# Patient Record
Sex: Female | Born: 1965 | Race: Black or African American | Hispanic: No | Marital: Single | Smoking: Current every day smoker
Health system: Southern US, Community
[De-identification: ages and names within clinical notes are randomized; demographics above are authoritative.]

## PROBLEM LIST (undated history)

## (undated) DIAGNOSIS — K439 Ventral hernia without obstruction or gangrene: Secondary | ICD-10-CM

## (undated) DIAGNOSIS — D649 Anemia, unspecified: Secondary | ICD-10-CM

## (undated) DIAGNOSIS — F32A Depression, unspecified: Secondary | ICD-10-CM

## (undated) DIAGNOSIS — Z9289 Personal history of other medical treatment: Secondary | ICD-10-CM

## (undated) DIAGNOSIS — F1911 Other psychoactive substance abuse, in remission: Secondary | ICD-10-CM

## (undated) DIAGNOSIS — F419 Anxiety disorder, unspecified: Secondary | ICD-10-CM

## (undated) DIAGNOSIS — F1910 Other psychoactive substance abuse, uncomplicated: Secondary | ICD-10-CM

## (undated) DIAGNOSIS — F329 Major depressive disorder, single episode, unspecified: Secondary | ICD-10-CM

## (undated) HISTORY — PX: TUBAL LIGATION: SHX77

## (undated) HISTORY — DX: Personal history of other medical treatment: Z92.89

## (undated) HISTORY — DX: Other psychoactive substance abuse, uncomplicated: F19.10

## (undated) HISTORY — PX: SALPINGECTOMY: SHX328

---
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
## 2011-03-17 DIAGNOSIS — F410 Panic disorder [episodic paroxysmal anxiety] without agoraphobia: Secondary | ICD-10-CM | POA: Insufficient documentation

## 2014-03-27 DIAGNOSIS — F1120 Opioid dependence, uncomplicated: Secondary | ICD-10-CM | POA: Insufficient documentation

## 2014-05-05 DIAGNOSIS — R7309 Other abnormal glucose: Secondary | ICD-10-CM | POA: Insufficient documentation

## 2018-01-25 DIAGNOSIS — N939 Abnormal uterine and vaginal bleeding, unspecified: Secondary | ICD-10-CM | POA: Insufficient documentation

## 2019-02-23 ENCOUNTER — Emergency Department (HOSPITAL_COMMUNITY): Payer: Medicare Other

## 2019-02-23 ENCOUNTER — Emergency Department (HOSPITAL_COMMUNITY)
Admission: EM | Admit: 2019-02-23 | Discharge: 2019-02-23 | Disposition: A | Discharge status: Home / Self Care | Payer: Medicare Other | Attending: Emergency Medicine | Admitting: Emergency Medicine

## 2019-02-23 ENCOUNTER — Other Ambulatory Visit: Payer: Self-pay

## 2019-02-23 DIAGNOSIS — K439 Ventral hernia without obstruction or gangrene: Secondary | ICD-10-CM | POA: Insufficient documentation

## 2019-02-23 DIAGNOSIS — Z79899 Other long term (current) drug therapy: Secondary | ICD-10-CM | POA: Diagnosis not present

## 2019-02-23 DIAGNOSIS — R109 Unspecified abdominal pain: Secondary | ICD-10-CM | POA: Diagnosis present

## 2019-02-23 DIAGNOSIS — K420 Umbilical hernia with obstruction, without gangrene: Secondary | ICD-10-CM | POA: Diagnosis not present

## 2019-02-23 LAB — CBC WITH DIFFERENTIAL/PLATELET
Abs Immature Granulocytes: 0.01 10*3/uL (ref 0.00–0.07)
Basophils Absolute: 0 10*3/uL (ref 0.0–0.1)
Basophils Relative: 1 %
Eosinophils Absolute: 0.1 10*3/uL (ref 0.0–0.5)
Eosinophils Relative: 2 %
HCT: 33 % — ABNORMAL LOW (ref 36.0–46.0)
Hemoglobin: 10 g/dL — ABNORMAL LOW (ref 12.0–15.0)
Immature Granulocytes: 0 %
Lymphocytes Relative: 35 %
Lymphs Abs: 1.7 10*3/uL (ref 0.7–4.0)
MCH: 24.4 pg — ABNORMAL LOW (ref 26.0–34.0)
MCHC: 30.3 g/dL (ref 30.0–36.0)
MCV: 80.7 fL (ref 80.0–100.0)
Monocytes Absolute: 0.4 10*3/uL (ref 0.1–1.0)
Monocytes Relative: 8 %
Neutro Abs: 2.7 10*3/uL (ref 1.7–7.7)
Neutrophils Relative %: 54 %
Platelets: 212 10*3/uL (ref 150–400)
RBC: 4.09 MIL/uL (ref 3.87–5.11)
RDW: 23.9 % — ABNORMAL HIGH (ref 11.5–15.5)
WBC: 4.9 10*3/uL (ref 4.0–10.5)
nRBC: 0 % (ref 0.0–0.2)

## 2019-02-23 LAB — BASIC METABOLIC PANEL
Anion gap: 9 (ref 5–15)
BUN: 12 mg/dL (ref 6–20)
CO2: 22 mmol/L (ref 22–32)
Calcium: 8.6 mg/dL — ABNORMAL LOW (ref 8.9–10.3)
Chloride: 104 mmol/L (ref 98–111)
Creatinine, Ser: 0.75 mg/dL (ref 0.44–1.00)
GFR calc Af Amer: >60 mL/min (ref >60)
GFR calc non Af Amer: >60 mL/min (ref >60)
Glucose, Bld: 122 mg/dL — ABNORMAL HIGH (ref 70–99)
Potassium: 3.2 mmol/L — ABNORMAL LOW (ref 3.5–5.1)
Sodium: 135 mmol/L (ref 135–145)

## 2019-02-23 LAB — I-STAT BETA HCG BLOOD, ED (MC, WL, AP ONLY): I-stat hCG, quantitative: <5 m[IU]/mL (ref <5)

## 2019-02-23 MED ORDER — ONDANSETRON 4 MG PO TBDP: 4.0000 mg | ORAL_TABLET | Freq: Three times a day (TID) | ORAL | 0 refills | Status: DC | PRN

## 2019-02-23 MED ORDER — ONDANSETRON HCL 4 MG/2ML IJ SOLN
4.0000 mg | Freq: Once | INTRAMUSCULAR | Status: AC
  Administered 2019-02-23: 4 mg via INTRAVENOUS
  Filled 2019-02-23: qty 2

## 2019-02-23 MED ORDER — HYDROMORPHONE HCL 1 MG/ML IJ SOLN
1.0000 mg | Freq: Once | INTRAMUSCULAR | Status: AC
  Administered 2019-02-23: 1 mg via INTRAVENOUS
  Filled 2019-02-23: qty 1

## 2019-02-23 NOTE — ED Notes (Signed)
Pt transported to Xray. 

## 2019-02-23 NOTE — ED Notes (Signed)
Pt noted to be sticking her finger in the back of her throat trying to gag herself.

## 2019-02-23 NOTE — ED Provider Notes (Signed)
[facility] EMERGENCY DEPARTMENT Provider Note   CSN: [id number] Arrival date & time: 02/23/19  1206    History   Chief Complaint Chief Complaint  Patient presents with  . Abdominal Pain    HPI Paula Leon is a 53 y.o. female.     HPI Patient presents with abdominal pain and vomiting.  Began around 40 minutes prior to arrival.  States this feels like the hernia she has had previously.  States it previously gone and on its own.  Pain is in her periumbilical area as is the mass.  No fevers.  Not on blood thinners.  She is on methadone.  States she has moved from [place] and does not have a doctor up here yet but does get her methadone up here. No past medical history on file. Does have history of anemia.  Also some depression. There are no active problems to display for this patient.     OB History   No obstetric history on file.      Home Medications    Prior to Admission medications   Medication Sig Start Date End Date Taking? Authorizing Provider  ferrous sulfate 325 (65 FE) MG tablet Take 325 mg by mouth 2 (two) times daily with a meal.   Yes [provider]  methadone (DOLOPHINE) 10 MG/5ML solution Take 40 mg by mouth daily.   Yes [provider]  PARoxetine (PAXIL) 20 MG tablet Take 20 mg by mouth daily.   Yes [provider]  ondansetron (ZOFRAN-ODT) 4 MG disintegrating tablet Take 1 tablet (4 mg total) by mouth every 8 (eight) hours as needed for nausea or vomiting. 02/23/19   Benjiman Core, MD    Family History No family history on file.  Social History Social History   Tobacco Use  . Smoking status: Not on file  Substance Use Topics  . Alcohol use: Not on file  . Drug use: Not on file     Allergies   Patient has no known allergies.   Review of Systems Review of Systems  Constitutional: Negative for appetite change.  HENT: Negative for congestion.   Respiratory: Negative for shortness of breath.    Cardiovascular: Negative for chest pain.  Gastrointestinal: Positive for abdominal pain, nausea and vomiting.  Genitourinary: Negative for flank pain.  Musculoskeletal: Negative for back pain.  Neurological: Negative for weakness.  Psychiatric/Behavioral: Negative for confusion.     Physical Exam Updated Vital Signs BP 107/64   Pulse 94   Temp 97.9 F (36.6 C) (Oral)   Resp 13   Ht 5\' 4"  (1.626 m)   Wt 99.8 kg   LMP 02/21/2019 (Exact Date)   SpO2 96%   BMI 37.76 kg/m   Physical Exam Vitals signs and nursing note reviewed.  Constitutional:      Comments: Patient is moaning in bed.  HENT:     Head: Atraumatic.  Eyes:     Pupils: Pupils are equal, round, and reactive to light.  Abdominal:     Hernia: A hernia is present. Hernia is present in the umbilical area.     Comments: Supraumbilical mass.  Approximately 5 to 7 cm across.  Tender.  Some mild diffuse tenderness without much distention otherwise.  Skin:    General: Skin is warm.  Neurological:     General: No focal deficit present.      ED Treatments / Results  Labs (all labs ordered are listed, but only abnormal results are displayed) Labs Reviewed  BASIC METABOLIC PANEL - Abnormal; Notable for the following components:      Result Value   Potassium 3.2 (*)    Glucose, Bld 122 (*)    Calcium 8.6 (*)    All other components within normal limits  CBC WITH DIFFERENTIAL/PLATELET - Abnormal; Notable for the following components:   Hemoglobin 10.0 (*)    HCT 33.0 (*)    MCH 24.4 (*)    RDW 23.9 (*)    All other components within normal limits  I-STAT BETA HCG BLOOD, ED ([facility], [facility], [facility] ONLY)    EKG EKG Interpretation  Date/Time:  Saturday Feb 23 2019 12:20:13 EDT Ventricular Rate:  81 PR Interval:    QRS Duration: 89 QT Interval:  358 QTC Calculation: 416 R Axis:   80 Text Interpretation:  Sinus rhythm Confirmed by Benjiman CorePickering, Kari Kerth [phone]([id number]) on 02/23/2019 12:32:26 PM   Radiology Dg Abd 2 Views   Result Date: 02/23/2019 CLINICAL DATA:  Central and upper abdominal pain beginning this afternoon. EXAM: ABDOMEN - 2 VIEW COMPARISON:  None. FINDINGS: The bowel gas pattern is normal. There is no evidence of free air. No radio-opaque calculi or other significant radiographic abnormality is seen. IMPRESSION: Negative exam. Electronically Signed   By: Drusilla Kannerhomas  Dalessio M.D.   On: 02/23/2019 16:03    Procedures Procedures (including critical care time)  Medications Ordered in ED Medications  HYDROmorphone (DILAUDID) injection 1 mg (1 mg Intravenous Given 02/23/19 1232)  ondansetron (ZOFRAN) injection 4 mg (4 mg Intravenous Given 02/23/19 1232)     Initial Impression / Assessment and Plan / ED Course  I have reviewed the triage vital signs and the nursing notes.  Pertinent labs & imaging results that were available during my care of the patient were reviewed by me and considered in my medical decision making (see chart for details).        Patient with umbilical hernia.  Initially very tender with mass but reduced under direct pressure.  Feels much better and pain resolved.  Eventually tolerated orals.  X-ray reassuring.  Will discharge home with general surgery follow-up.  Doubt ischemic bowel.  Final Clinical Impressions(s) / ED Diagnoses   Final diagnoses:  Hernia of abdominal wall  Umbilical hernia with obstruction, without gangrene    ED Discharge Orders         Ordered    ondansetron (ZOFRAN-ODT) 4 MG disintegrating tablet  Every 8 hours PRN     02/23/19 1711           Benjiman CorePickering, Murrell Dome, MD 02/23/19 1921

## 2019-02-23 NOTE — ED Triage Notes (Signed)
Pt here for central upper abdominal pain r/t hernia that began 30 mins ago. Pt writhing around in bed and crying. Pt endorses vomiting x 3.

## 2019-02-23 NOTE — ED Notes (Signed)
Pt gagging/stating she needs to vomit after PO challenge.

## 2019-02-23 NOTE — ED Notes (Signed)
Pt given a gingerale

## 2019-03-01 ENCOUNTER — Ambulatory Visit: Payer: Self-pay | Admitting: Surgery

## 2019-03-12 ENCOUNTER — Other Ambulatory Visit: Payer: Self-pay

## 2019-03-12 ENCOUNTER — Ambulatory Visit (HOSPITAL_COMMUNITY)
Admission: EM | Admit: 2019-03-12 | Discharge: 2019-03-12 | Disposition: A | Discharge status: Home / Self Care | Payer: Medicare Other

## 2019-03-12 ENCOUNTER — Encounter (HOSPITAL_COMMUNITY): Payer: Self-pay

## 2019-03-12 DIAGNOSIS — N644 Mastodynia: Secondary | ICD-10-CM | POA: Diagnosis not present

## 2019-03-12 HISTORY — DX: Ventral hernia without obstruction or gangrene: K43.9

## 2019-03-12 HISTORY — DX: Anemia, unspecified: D64.9

## 2019-03-12 NOTE — ED Provider Notes (Signed)
[facility]    CSN: [id number] Arrival date & time: 03/12/19  0801     History   Chief Complaint Chief Complaint  Patient presents with  . Breast Pain    HPI Paula Leon is a 53 y.o. female presenting with bilateral breast pain.  Patient states that this started Saturday with press.  States pain is dull, achy, "like it is full like it had a baby ", and constant.  Patient is not tried anything for this.  Patient states leaning forward causes more breast tenderness.  Patient also endorsing nipple tenderness: Denies retraction, discharge, change in appearance of skin - now redness or warmth to touch.  LMP 5/28: Patient not currently sexually active.  Patient denies lower abdominal, pelvic, vaginal pain or discharge.  She denies personal or family history of breast malignancy.   Past Medical History:  Diagnosis Date  . Anemia   . Hernia of abdominal wall     There are no active problems to display for this patient.   Past Surgical History:  Procedure Laterality Date  . TUBAL LIGATION      OB History   No obstetric history on file.      Home Medications    Prior to Admission medications   Medication Sig Start Date End Date Taking? Authorizing Provider  ferrous sulfate 325 (65 FE) MG tablet Take 325 mg by mouth 2 (two) times daily with a meal.    [provider]  methadone (DOLOPHINE) 10 MG/5ML solution Take 40 mg by mouth daily.    [provider]  ondansetron (ZOFRAN-ODT) 4 MG disintegrating tablet Take 1 tablet (4 mg total) by mouth every 8 (eight) hours as needed for nausea or vomiting. 02/23/19   Davonna Belling, MD  PARoxetine (PAXIL) 20 MG tablet Take 20 mg by mouth daily.    [provider]    Family History Family History  Problem Relation Age of Onset  . Cancer Mother   . Cancer Father     Social History Social History   Tobacco Use  . Smoking status: Current Every Day Smoker    Packs/day: 0.50    Types:  Cigarettes  . Smokeless tobacco: Never Used  Substance Use Topics  . Alcohol use: Never    Frequency: Never  . Drug use: Never     Allergies   Patient has no known allergies.   Review of Systems As per HPI   Physical Exam Triage Vital Signs ED Triage Vitals  Enc Vitals Group     BP      Pulse      Resp      Temp      Temp src      SpO2      Weight      Height      Head Circumference      Peak Flow      Pain Score      Pain Loc      Pain Edu?      Excl. in [place]?    No data found.  Updated Vital Signs BP (!) 146/82   Pulse 82   Temp 98.3 F (36.8 C)   Resp 18   LMP 02/21/2019 (Exact Date)   SpO2 99%   Visual Acuity Right Eye Distance:   Left Eye Distance:   Bilateral Distance:    Right Eye Near:   Left Eye Near:    Bilateral Near:     Physical Exam Constitutional:  General: She is not in acute distress. HENT:     Head: Normocephalic and atraumatic.  Eyes:     General: No scleral icterus.    Pupils: Pupils are equal, round, and reactive to light.  Cardiovascular:     Rate and Rhythm: Normal rate.  Pulmonary:     Effort: Pulmonary effort is normal.  Chest:     Comments: Large breasts that are symmetric and without obvious deformity.  No concerning masses, lesions, peau d'orange noted.  Patient is diffusely tender.  Nipples erect with stimulation, no discharge when palpated.  No axillary or supraclavicular lymphadenopathy appreciated. Skin:    Coloration: Skin is not jaundiced or pale.  Neurological:     Mental Status: She is alert and oriented to person, place, and time.      UC Treatments / Results  Labs (all labs ordered are listed, but only abnormal results are displayed) Labs Reviewed - No data to display  EKG None  Radiology No results found.  Procedures Procedures (including critical care time)  Medications Ordered in UC Medications - No data to display  Initial Impression / Assessment and Plan / UC Course  I have  reviewed the triage vital signs and the nursing notes.  Pertinent labs & imaging results that were available during my care of the patient were reviewed by me and considered in my medical decision making (see chart for details).     53 year old female presenting for bilateral breast tenderness.  Physical exam reassuring, could be related to perimenopausal hormone fluctuation.  Patient looking to establish care, due for routine mammography.  Information given in office, patient going to schedule appointment.  Patient to treat with NSAIDs, warm compresses, and monitor for worsening symptoms.  Precautions were discussed, patient verbalized understanding. Final Clinical Impressions(s) / UC Diagnoses   Final diagnoses:  Breast tenderness in female     Discharge Instructions     Use OTC ibuprofen and/or tylenol. Use hot compresses 3-4 times daily for additional relief.    ED Prescriptions    None     Controlled Substance Prescriptions [place] Controlled Substance Registry consulted? Not Applicable   Shea EvansHall-Potvin, , [place]PA-C 03/12/19 1633

## 2019-03-12 NOTE — ED Triage Notes (Signed)
Pt presents with complaints of right breast pain that started on Saturday. Denies any other symptoms.

## 2019-03-12 NOTE — Discharge Instructions (Addendum)
Use OTC ibuprofen and/or tylenol. Use hot compresses 3-4 times daily for additional relief.

## 2019-04-11 ENCOUNTER — Other Ambulatory Visit: Payer: Self-pay

## 2019-04-11 ENCOUNTER — Encounter (HOSPITAL_BASED_OUTPATIENT_CLINIC_OR_DEPARTMENT_OTHER): Payer: Self-pay

## 2019-04-14 ENCOUNTER — Encounter (HOSPITAL_BASED_OUTPATIENT_CLINIC_OR_DEPARTMENT_OTHER): Payer: Self-pay | Admitting: Surgery

## 2019-04-14 DIAGNOSIS — K439 Ventral hernia without obstruction or gangrene: Secondary | ICD-10-CM | POA: Diagnosis present

## 2019-04-14 NOTE — H&P (Signed)
General Surgery [facility]  Paula Leon DOB: 1966/07/28 Single / Language: [place] / Race: Black or African American Female   History of Present Illness   The patient is a 53 year old female who presents with an abdominal wall hernia.  CHIEF COMPLAINT: ventral hernia  Patient is referred by Dr. Benjiman CoreNathan Pickering from the emergency department for surgical evaluation and management of potential hernia. Patient has had a ventral hernia present for approximately 9 years. This has gradually increased in size. She has had a couple of episodes where it caused her considerable pain. She denies any change in bowel habits. She has had no prior abdominal surgery other than a tubal ligation. She has had no prior hernia repairs. She is referred from the emergency department for surgical evaluation for repair of ventral hernia.   Past Surgical History  No pertinent past surgical history   Diagnostic Studies History  Mammogram  never Pap Smear  1-5 years ago  Allergies  No Known Drug Allergies  Allergies Reconciled   Medication History FeroSul (325 (65 Fe)MG Tablet, Oral) Active. PARoxetine HCl (20MG  Tablet, Oral) Active. Methadone HCl (40MG  Tablet Soluble, Oral) Active. Medications Reconciled  Social History Alcohol use  Remotely quit alcohol use. Caffeine use  Coffee. Illicit drug use  Remotely quit drug use. Tobacco use  Current every day smoker.  Family History  Alcohol Abuse  Family Members In General. Hypertension  Family Members In General. Kidney Disease  Family Members In General.  Pregnancy / Birth History  Age at menarche  16 years. Age of menopause  [id number] Contraceptive History  Oral contraceptives. Maternal age  51-20 Para  7 Regular periods   Other Problems  Anxiety Disorder   Review of Systems General Not Present- Appetite Loss, Chills, Fatigue, Fever, Night Sweats, Weight Gain and Weight Loss. Skin Not  Present- Change in Wart/Mole, Dryness, Hives, Jaundice, New Lesions, Non-Healing Wounds, Rash and Ulcer. HEENT Not Present- Earache, Hearing Loss, Hoarseness, Nose Bleed, Oral Ulcers, Ringing in the Ears, Seasonal Allergies, Sinus Pain, Sore Throat, Visual Disturbances, Wears glasses/contact lenses and Yellow Eyes. Respiratory Not Present- Bloody sputum, Chronic Cough, Difficulty Breathing, Snoring and Wheezing. Breast Not Present- Breast Mass, Breast Pain, Nipple Discharge and Skin Changes. Cardiovascular Not Present- Chest Pain, Difficulty Breathing Lying Down, Leg Cramps, Palpitations, Rapid Heart Rate, Shortness of Breath and Swelling of Extremities. Gastrointestinal Present- Abdominal Pain and Nausea. Not Present- Bloating, Bloody Stool, Change in Bowel Habits, Chronic diarrhea, Constipation, Difficulty Swallowing, Excessive gas, Gets full quickly at meals, Hemorrhoids, Indigestion, Rectal Pain and Vomiting. Female Genitourinary Not Present- Frequency, Nocturia, Painful Urination, Pelvic Pain and Urgency. Musculoskeletal Not Present- Back Pain, Joint Pain, Joint Stiffness, Muscle Pain, Muscle Weakness and Swelling of Extremities. Neurological Not Present- Decreased Memory, Fainting, Headaches, Numbness, Seizures, Tingling, Tremor, Trouble walking and Weakness. Psychiatric Not Present- Anxiety, Bipolar, Change in Sleep Pattern, Depression, Fearful and Frequent crying. Endocrine Not Present- Cold Intolerance, Excessive Hunger, Hair Changes, Heat Intolerance, Hot flashes and New Diabetes. Hematology Not Present- Blood Thinners, Easy Bruising, Excessive bleeding, Gland problems, HIV and Persistent Infections.  Vitals  Weight: 231.6 lb Height: 64in Body Surface Area: 2.08 m Body Mass Index: 39.75 kg/m  Temp.: 98.30F  Pulse: 119 (Regular)  BP: 112/72(Sitting, Left Arm, Standard)   Physical Exam   See vital signs recorded above  GENERAL APPEARANCE Development: normal Nutritional  status: normal Gross deformities: none  SKIN Rash, lesions, ulcers: none Induration, erythema: none Nodules: none palpable  EYES Conjunctiva and  lids: normal Pupils: equal and reactive Iris: normal bilaterally  EARS, NOSE, MOUTH, THROAT External ears: no lesion or deformity External nose: no lesion or deformity Hearing: grossly normal Lips: no lesion or deformity Dentition: normal for age Oral mucosa: moist  NECK Symmetric: yes Trachea: midline Thyroid: no palpable nodules in the thyroid bed  CHEST Respiratory effort: normal Retraction or accessory muscle use: no Breath sounds: normal bilaterally Rales, rhonchi, wheeze: none  CARDIOVASCULAR Auscultation: regular rhythm, normal rate Murmurs: none Pulses: carotid and radial pulse 2+ palpable Lower extremity edema: none Lower extremity varicosities: none  ABDOMEN Distension: none Masses: none palpable Tenderness: none Hepatosplenomegaly: not present Hernia: Moderate sized hernia sac in the midline above the level of the umbilicus consistent with a midline ventral hernia. With manipulation it is partially reducible but this causes significant discomfort and hernia is not fully reducible. It is not tender.  MUSCULOSKELETAL Station and gait: normal Digits and nails: no clubbing or cyanosis Muscle strength: grossly normal all extremities Range of motion: grossly normal all extremities Deformity: none  LYMPHATIC Cervical: none palpable Supraclavicular: none palpable  PSYCHIATRIC Oriented to person, place, and time: yes Mood and affect: normal for situation Judgment and insight: appropriate for situation    Assessment & Plan  VENTRAL HERNIA WITHOUT OBSTRUCTION OR GANGRENE (K43.9)   Pt Education - Pamphlet Given - Hernia Surgery: discussed with patient and provided information.  Patient is referred by Dr. Davonna Belling from the emergency department for surgical evaluation and management of ventral  hernia. This is a long-standing ventral hernia which is gradually increased in size and is becoming more symptomatic. Patient is provided with written literature on hernia repair to review at home.  Patient has a ventral hernia which is not fully reducible. She has no signs or symptoms of obstruction. Patient does require operative repair. We discussed hernia repair using a mesh patch. We discussed restrictions on her activities after the procedure. We discussed wearing an abdominal binder. Patient understands and wishes to proceed with surgery in the near future.  Patient does need to establish a relationship with a primary care provider. She also states that she is entering menopause and may need gynecologic evaluation. We will refer her to the [facility] across the street. They should be able to provide primary care and if necessary make referral to gynecology.  The risks and benefits of the procedure have been discussed at length with the patient. The patient understands the proposed procedure, potential alternative treatments, and the course of recovery to be expected. All of the patient's questions have been answered at this time. The patient wishes to proceed with surgery.  Armandina Gemma, [facility] Office: [phone]

## 2019-04-16 ENCOUNTER — Other Ambulatory Visit (HOSPITAL_COMMUNITY)
Admission: RE | Admit: 2019-04-16 | Discharge: 2019-04-16 | Disposition: A | Discharge status: Home / Self Care | Payer: Medicare Other | Source: Ambulatory Visit | Attending: Surgery | Admitting: Surgery

## 2019-04-16 DIAGNOSIS — Z1159 Encounter for screening for other viral diseases: Secondary | ICD-10-CM | POA: Insufficient documentation

## 2019-04-16 LAB — SARS CORONAVIRUS 2 (TAT 6-24 HRS): SARS Coronavirus 2: NEGATIVE

## 2019-04-19 ENCOUNTER — Ambulatory Visit (HOSPITAL_BASED_OUTPATIENT_CLINIC_OR_DEPARTMENT_OTHER): Payer: Medicare Other | Admitting: Anesthesiology

## 2019-04-19 ENCOUNTER — Encounter (HOSPITAL_BASED_OUTPATIENT_CLINIC_OR_DEPARTMENT_OTHER)
Admission: RE | Disposition: A | Discharge status: Home / Self Care | Payer: Self-pay | Source: Home / Self Care | Attending: Surgery

## 2019-04-19 ENCOUNTER — Ambulatory Visit (HOSPITAL_BASED_OUTPATIENT_CLINIC_OR_DEPARTMENT_OTHER)
Admission: RE | Admit: 2019-04-19 | Discharge: 2019-04-19 | Disposition: A | Discharge status: Home / Self Care | Payer: Medicare Other | Attending: Surgery | Admitting: Surgery

## 2019-04-19 ENCOUNTER — Other Ambulatory Visit: Payer: Self-pay

## 2019-04-19 ENCOUNTER — Encounter (HOSPITAL_BASED_OUTPATIENT_CLINIC_OR_DEPARTMENT_OTHER): Payer: Self-pay

## 2019-04-19 DIAGNOSIS — K436 Other and unspecified ventral hernia with obstruction, without gangrene: Secondary | ICD-10-CM | POA: Insufficient documentation

## 2019-04-19 DIAGNOSIS — Z6839 Body mass index (BMI) 39.0-39.9, adult: Secondary | ICD-10-CM | POA: Diagnosis not present

## 2019-04-19 DIAGNOSIS — Z79899 Other long term (current) drug therapy: Secondary | ICD-10-CM | POA: Insufficient documentation

## 2019-04-19 DIAGNOSIS — K439 Ventral hernia without obstruction or gangrene: Secondary | ICD-10-CM | POA: Diagnosis present

## 2019-04-19 DIAGNOSIS — Z79891 Long term (current) use of opiate analgesic: Secondary | ICD-10-CM | POA: Diagnosis not present

## 2019-04-19 DIAGNOSIS — G8929 Other chronic pain: Secondary | ICD-10-CM | POA: Insufficient documentation

## 2019-04-19 DIAGNOSIS — F17200 Nicotine dependence, unspecified, uncomplicated: Secondary | ICD-10-CM | POA: Diagnosis not present

## 2019-04-19 DIAGNOSIS — F419 Anxiety disorder, unspecified: Secondary | ICD-10-CM | POA: Insufficient documentation

## 2019-04-19 HISTORY — DX: Other psychoactive substance abuse, in remission: F19.11

## 2019-04-19 HISTORY — PX: VENTRAL HERNIA REPAIR: SHX424

## 2019-04-19 HISTORY — DX: Anxiety disorder, unspecified: F41.9

## 2019-04-19 HISTORY — PX: INSERTION OF MESH: SHX5868

## 2019-04-19 HISTORY — DX: Depression, unspecified: F32.A

## 2019-04-19 SURGERY — REPAIR, HERNIA, VENTRAL
Anesthesia: General | Site: Abdomen

## 2019-04-19 MED ORDER — ROCURONIUM BROMIDE 100 MG/10ML IV SOLN
INTRAVENOUS | Status: DC | PRN
  Administered 2019-04-19: 30 mg via INTRAVENOUS

## 2019-04-19 MED ORDER — MIDAZOLAM HCL 2 MG/2ML IJ SOLN
1.0000 mg | INTRAMUSCULAR | Status: DC | PRN
  Administered 2019-04-19: 16:00:00 2 mg via INTRAVENOUS

## 2019-04-19 MED ORDER — KETOROLAC TROMETHAMINE 30 MG/ML IJ SOLN
INTRAMUSCULAR | Status: AC
  Filled 2019-04-19: qty 1

## 2019-04-19 MED ORDER — KETAMINE HCL 100 MG/ML IJ SOLN
INTRAMUSCULAR | Status: AC
  Filled 2019-04-19: qty 1

## 2019-04-19 MED ORDER — HYDROMORPHONE HCL 1 MG/ML IJ SOLN
INTRAMUSCULAR | Status: AC
  Filled 2019-04-19: qty 0.5

## 2019-04-19 MED ORDER — MIDAZOLAM HCL 2 MG/2ML IJ SOLN
INTRAMUSCULAR | Status: AC
  Filled 2019-04-19: qty 2

## 2019-04-19 MED ORDER — FENTANYL CITRATE (PF) 100 MCG/2ML IJ SOLN
INTRAMUSCULAR | Status: AC
  Filled 2019-04-19: qty 2

## 2019-04-19 MED ORDER — HYDROCODONE-ACETAMINOPHEN 5-325 MG PO TABS: 1.0000 | ORAL_TABLET | ORAL | 0 refills | Status: DC | PRN

## 2019-04-19 MED ORDER — PROPOFOL 500 MG/50ML IV EMUL
INTRAVENOUS | Status: DC | PRN
  Administered 2019-04-19: 25 ug/kg/min via INTRAVENOUS

## 2019-04-19 MED ORDER — KETOROLAC TROMETHAMINE 30 MG/ML IJ SOLN
30.0000 mg | Freq: Once | INTRAMUSCULAR | Status: AC | PRN
  Administered 2019-04-19: 30 mg via INTRAVENOUS

## 2019-04-19 MED ORDER — SUGAMMADEX SODIUM 200 MG/2ML IV SOLN
INTRAVENOUS | Status: DC | PRN
  Administered 2019-04-19: 200 mg via INTRAVENOUS

## 2019-04-19 MED ORDER — PROMETHAZINE HCL 25 MG/ML IJ SOLN: 6.2500 mg | INTRAMUSCULAR | Status: DC | PRN

## 2019-04-19 MED ORDER — OXYCODONE HCL 5 MG PO TABS
5.0000 mg | ORAL_TABLET | Freq: Once | ORAL | Status: AC | PRN
  Administered 2019-04-19: 5 mg via ORAL

## 2019-04-19 MED ORDER — PROPOFOL 10 MG/ML IV BOLUS
INTRAVENOUS | Status: DC | PRN
  Administered 2019-04-19: 150 mg via INTRAVENOUS

## 2019-04-19 MED ORDER — FENTANYL CITRATE (PF) 100 MCG/2ML IJ SOLN
50.0000 ug | INTRAMUSCULAR | Status: DC | PRN
  Administered 2019-04-19: 150 ug via INTRAVENOUS
  Administered 2019-04-19: 16:00:00 50 ug via INTRAVENOUS

## 2019-04-19 MED ORDER — CEFAZOLIN SODIUM-DEXTROSE 2-4 GM/100ML-% IV SOLN
INTRAVENOUS | Status: AC
  Filled 2019-04-19: qty 100

## 2019-04-19 MED ORDER — CHLORHEXIDINE GLUCONATE CLOTH 2 % EX PADS: 6.0000 | MEDICATED_PAD | Freq: Once | CUTANEOUS | Status: DC

## 2019-04-19 MED ORDER — SCOPOLAMINE 1 MG/3DAYS TD PT72: 1.0000 | MEDICATED_PATCH | Freq: Once | TRANSDERMAL | Status: DC

## 2019-04-19 MED ORDER — LIDOCAINE HCL (CARDIAC) PF 100 MG/5ML IV SOSY
PREFILLED_SYRINGE | INTRAVENOUS | Status: DC | PRN
  Administered 2019-04-19: 100 mg via INTRAVENOUS

## 2019-04-19 MED ORDER — HYDROMORPHONE HCL 1 MG/ML IJ SOLN
0.5000 mg | INTRAMUSCULAR | Status: DC | PRN
  Administered 2019-04-19: 0.5 mg via INTRAVENOUS

## 2019-04-19 MED ORDER — HYDROMORPHONE HCL 1 MG/ML IJ SOLN
0.2500 mg | INTRAMUSCULAR | Status: DC | PRN
  Administered 2019-04-19 (×5): 0.5 mg via INTRAVENOUS

## 2019-04-19 MED ORDER — 0.9 % SODIUM CHLORIDE (POUR BTL) OPTIME
TOPICAL | Status: DC | PRN
  Administered 2019-04-19: 1000 mL

## 2019-04-19 MED ORDER — ONDANSETRON HCL 4 MG/2ML IJ SOLN
INTRAMUSCULAR | Status: DC | PRN
  Administered 2019-04-19 (×2): 4 mg via INTRAVENOUS

## 2019-04-19 MED ORDER — BUPIVACAINE HCL (PF) 0.5 % IJ SOLN
INTRAMUSCULAR | Status: DC | PRN
  Administered 2019-04-19: 30 mL

## 2019-04-19 MED ORDER — ACETAMINOPHEN 500 MG PO TABS: 1000.0000 mg | ORAL_TABLET | Freq: Once | ORAL | Status: DC

## 2019-04-19 MED ORDER — LACTATED RINGERS IV SOLN
INTRAVENOUS | Status: DC
  Administered 2019-04-19 (×3): via INTRAVENOUS

## 2019-04-19 MED ORDER — DEXTROSE 5 % IV SOLN
3.0000 g | INTRAVENOUS | Status: AC
  Administered 2019-04-19: 16:00:00 2 g via INTRAVENOUS

## 2019-04-19 MED ORDER — OXYCODONE HCL 5 MG PO TABS
ORAL_TABLET | ORAL | Status: AC
  Filled 2019-04-19: qty 1

## 2019-04-19 MED ORDER — SUCCINYLCHOLINE CHLORIDE 20 MG/ML IJ SOLN
INTRAMUSCULAR | Status: DC | PRN
  Administered 2019-04-19: 120 mg via INTRAVENOUS

## 2019-04-19 MED ORDER — OXYCODONE HCL 5 MG/5ML PO SOLN: 5.0000 mg | Freq: Once | ORAL | Status: AC | PRN

## 2019-04-19 MED ORDER — ACETAMINOPHEN 500 MG PO TABS
ORAL_TABLET | ORAL | Status: AC
  Filled 2019-04-19: qty 2

## 2019-04-19 MED ORDER — KETAMINE HCL 100 MG/ML IJ SOLN
INTRAMUSCULAR | Status: DC | PRN
  Administered 2019-04-19: 50 mg via INTRAVENOUS

## 2019-04-19 MED ORDER — DEXAMETHASONE SODIUM PHOSPHATE 4 MG/ML IJ SOLN
INTRAMUSCULAR | Status: DC | PRN
  Administered 2019-04-19: 10 mg via INTRAVENOUS

## 2019-04-19 SURGICAL SUPPLY — 43 items
BLADE CLIPPER SURG (BLADE) ×3 IMPLANT
BLADE HEX COATED 2.75 (ELECTRODE) ×3 IMPLANT
BLADE SURG 15 STRL LF DISP TIS (BLADE) ×1 IMPLANT
BLADE SURG 15 STRL SS (BLADE) ×2
CANISTER SUCT 1200ML W/VALVE (MISCELLANEOUS) ×3 IMPLANT
CHLORAPREP W/TINT 26 (MISCELLANEOUS) ×3 IMPLANT
CLOSURE WOUND 1/2 X4 (GAUZE/BANDAGES/DRESSINGS) ×1
COVER BACK TABLE REUSABLE LG (DRAPES) ×3 IMPLANT
COVER MAYO STAND REUSABLE (DRAPES) ×3 IMPLANT
COVER WAND RF STERILE (DRAPES) IMPLANT
DECANTER SPIKE VIAL GLASS SM (MISCELLANEOUS) ×3 IMPLANT
DRAIN CHANNEL 19F RND (DRAIN) IMPLANT
DRAPE LAPAROTOMY 100X72 PEDS (DRAPES) ×3 IMPLANT
DRAPE UTILITY XL STRL (DRAPES) ×3 IMPLANT
ELECT REM PT RETURN 9FT ADLT (ELECTROSURGICAL) ×3
ELECTRODE REM PT RTRN 9FT ADLT (ELECTROSURGICAL) ×1 IMPLANT
EVACUATOR SILICONE 100CC (DRAIN) IMPLANT
GAUZE SPONGE 4X4 12PLY STRL LF (GAUZE/BANDAGES/DRESSINGS) ×3 IMPLANT
GLOVE SURG ORTHO 8.0 STRL STRW (GLOVE) ×3 IMPLANT
GOWN STRL REUS W/ TWL LRG LVL3 (GOWN DISPOSABLE) ×1 IMPLANT
GOWN STRL REUS W/ TWL XL LVL3 (GOWN DISPOSABLE) ×1 IMPLANT
GOWN STRL REUS W/TWL LRG LVL3 (GOWN DISPOSABLE) ×2
GOWN STRL REUS W/TWL XL LVL3 (GOWN DISPOSABLE) ×2
MESH VENTRALEX ST 2.5 CRC MED (Mesh General) ×2 IMPLANT
NDL HYPO 25X1 1.5 SAFETY (NEEDLE) IMPLANT
NEEDLE HYPO 25X1 1.5 SAFETY (NEEDLE) IMPLANT
NS IRRIG 1000ML POUR BTL (IV SOLUTION) ×3 IMPLANT
PACK BASIN DAY SURGERY FS (CUSTOM PROCEDURE TRAY) ×3 IMPLANT
PENCIL BUTTON HOLSTER BLD 10FT (ELECTRODE) ×3 IMPLANT
SLEEVE SCD COMPRESS KNEE MED (MISCELLANEOUS) IMPLANT
SPONGE LAP 4X18 RFD (DISPOSABLE) ×3 IMPLANT
STRIP CLOSURE SKIN 1/2X4 (GAUZE/BANDAGES/DRESSINGS) ×2 IMPLANT
SUT ETHIBOND 0 MO6 C/R (SUTURE) IMPLANT
SUT ETHILON 3 0 PS 1 (SUTURE) IMPLANT
SUT MNCRL AB 4-0 PS2 18 (SUTURE) ×3 IMPLANT
SUT NOVA NAB DX-16 0-1 5-0 T12 (SUTURE) ×4 IMPLANT
SUT VICRYL 3-0 CR8 SH (SUTURE) ×3 IMPLANT
SYR CONTROL 10ML LL (SYRINGE) IMPLANT
TAPE HYPAFIX 4 X10 (GAUZE/BANDAGES/DRESSINGS) ×3 IMPLANT
TOWEL GREEN STERILE FF (TOWEL DISPOSABLE) ×6 IMPLANT
TUBE CONNECTING 20'X1/4 (TUBING) ×1
TUBE CONNECTING 20X1/4 (TUBING) ×2 IMPLANT
YANKAUER SUCT BULB TIP NO VENT (SUCTIONS) ×3 IMPLANT

## 2019-04-19 NOTE — Anesthesia Procedure Notes (Signed)
Procedure Name: Intubation Date/Time: 04/19/2019 3:54 PM Performed by: Lyndee Leo, CRNA Pre-anesthesia Checklist: Patient identified, Emergency Drugs available, Suction available and Patient being monitored Patient Re-evaluated:Patient Re-evaluated prior to induction Oxygen Delivery Method: Circle system utilized Preoxygenation: Pre-oxygenation with 100% oxygen Induction Type: IV induction and Rapid sequence Ventilation: Mask ventilation without difficulty Laryngoscope Size: Mac and 3 Grade View: Grade I Tube type: Oral Tube size: 7.0 mm Number of attempts: 1 Airway Equipment and Method: Stylet and Oral airway Placement Confirmation: ETT inserted through vocal cords under direct vision,  positive ETCO2 and breath sounds checked- equal and bilateral Secured at: 21 cm Tube secured with: Tape Dental Injury: Teeth and Oropharynx as per pre-operative assessment

## 2019-04-19 NOTE — Op Note (Signed)
Operative Note  Pre-operative Diagnosis:  Ventral hernia  Post-operative Diagnosis:  same  Surgeon:  Armandina Gemma, MD  Assistant:  none   Procedure:  Open repair ventral hernia with mesh patch  Anesthesia:  general  Estimated Blood Loss:  25 cc  Drains: none         Specimen: none  Indications:  Patient is referred by Dr. Davonna Belling from the emergency department for surgical evaluation and management of potential hernia. Patient has had a ventral hernia present for approximately 9 years. This has gradually increased in size. She has had a couple of episodes where it caused her considerable pain. She denies any change in bowel habits. She has had no prior abdominal surgery other than a tubal ligation. She has had no prior hernia repairs. She is referred from the emergency department for surgical evaluation for repair of ventral hernia.  Procedure Details:  The patient was seen in the pre-op holding area. The risks, benefits, complications, treatment options, and expected outcomes were previously discussed with the patient. The patient agreed with the proposed plan and has signed the informed consent form.  The patient was brought to the operating room by the surgical team, identified as Leandra Kern and the procedure verified. A "time out" was completed and the above information confirmed.  Following administration of general anesthesia, the patient is prepped and draped in the usual aseptic fashion.  After ascertaining that an adequate level of anesthesia been achieved, a 5 cm incision is made in the midline just above the umbilicus.  Dissection was carried in the subcutaneous tissues.  There is a large hernia sac measuring approximately 8 cm in diameter.  This is dissected out circumferentially down to the fascia.  Fascial defect is approximately 2 cm in diameter.  Hernia sac is opened.  The hernia contains incarcerated omentum.  Hernia sac is excised and discarded.  Fascia is  incised in the midline cephalad allowing for reduction of the omentum back into the peritoneal cavity.  Preperitoneal space is developed to accommodate the mesh.  A medium ventralex ST mesh patch measuring 6.4 cm in diameter is selected and prepared.  It is inserted into the preperitoneal space and deployed circumferentially.  The fascial defect is then closed with interrupted #1 Novafil simple sutures incorporating the mesh into the closure.  Fascial defect is completely closed without significant tension.  Local anesthetic is infiltrated circumferentially in the fascia.  Local anesthetic is also infiltrated circumferentially into the skin edges.  Subcutaneous tissues are closed in layers with interrupted 3-0 Vicryl sutures.  Skin is closed with a running 4-0 Monocryl subcuticular suture.  Wound is washed and dried and Dermabond is placed as dressing.  Patient is awakened from anesthesia and brought to the recovery room in stable condition.  The patient tolerated the procedure well.   Armandina Gemma, MD [facility] Office: [phone]

## 2019-04-19 NOTE — Transfer of Care (Signed)
Immediate Anesthesia Transfer of Care Note  Patient: Paula Leon  Procedure(s) Performed: VENTRAL HERNIA REPAIR (N/A Abdomen) INSERTION OF MESH (N/A Abdomen)  Patient Location: PACU  Anesthesia Type:General  Level of Consciousness: awake and confused  Airway & Oxygen Therapy: Patient Spontanous Breathing and Patient connected to face mask oxygen  Post-op Assessment: Report given to RN and Post -op Vital signs reviewed and stable  Post vital signs: Reviewed and stable  Last Vitals:  Vitals Value Taken Time  BP 152/116 04/19/19 1704  Temp    Pulse 106 04/19/19 1710  Resp 20 04/19/19 1710  SpO2 99 % 04/19/19 1710  Vitals shown include unvalidated device data.  Last Pain:  Vitals:   04/19/19 1334  TempSrc: Oral  PainSc: 0-No pain         Complications: No apparent anesthesia complications

## 2019-04-19 NOTE — Anesthesia Postprocedure Evaluation (Signed)
Anesthesia Post Note  Patient: Paula Leon  Procedure(s) Performed: VENTRAL HERNIA REPAIR (N/A Abdomen) INSERTION OF MESH (N/A Abdomen)     Patient location during evaluation: PACU Anesthesia Type: General Level of consciousness: awake and alert Pain management: pain level controlled Vital Signs Assessment: post-procedure vital signs reviewed and stable Respiratory status: spontaneous breathing, nonlabored ventilation, respiratory function stable and patient connected to nasal cannula oxygen Cardiovascular status: blood pressure returned to baseline and stable Postop Assessment: no apparent nausea or vomiting Anesthetic complications: no    Last Vitals:  Vitals:   04/19/19 1730 04/19/19 1745  BP: (!) 140/93 (!) 129/100  Pulse: (!) 102 84  Resp: 20 18  Temp:    SpO2: 100% 100%    Last Pain:  Vitals:   04/19/19 1334  TempSrc: Oral  PainSc: 0-No pain                 Montoya Brandel S

## 2019-04-19 NOTE — Discharge Instructions (Signed)
Pain medicine (Oxycodone) taken at 6:45p.  Post Anesthesia Home Care Instructions  Activity: Get plenty of rest for the remainder of the day. A responsible individual must stay with you for 24 hours following the procedure.  For the next 24 hours, DO NOT: -Drive a car -Paediatric nurse -Drink alcoholic beverages -Take any medication unless instructed by your physician -Make any legal decisions or sign important papers.  Meals: Start with liquid foods such as gelatin or soup. Progress to regular foods as tolerated. Avoid greasy, spicy, heavy foods. If nausea and/or vomiting occur, drink only clear liquids until the nausea and/or vomiting subsides. Call your physician if vomiting continues.  Special Instructions/Symptoms: Your throat may feel dry or sore from the anesthesia or the breathing tube placed in your throat during surgery. If this causes discomfort, gargle with warm salt water. The discomfort should disappear within 24 hours.  If you had a scopolamine patch placed behind your ear for the management of post- operative nausea and/or vomiting:  1. The medication in the patch is effective for 72 hours, after which it should be removed.  Wrap patch in a tissue and discard in the trash. Wash hands thoroughly with soap and water. 2. You may remove the patch earlier than 72 hours if you experience unpleasant side effects which may include dry mouth, dizziness or visual disturbances. 3. Avoid touching the patch. Wash your hands with soap and water after contact with the patch.

## 2019-04-19 NOTE — Interval H&P Note (Signed)
History and Physical Interval Note:  04/19/2019 3:37 PM  Paula Leon  has presented today for surgery, with the diagnosis of VENTRAL HERNIA.  The various methods of treatment have been discussed with the patient and family. After consideration of risks, benefits and other options for treatment, the patient has consented to    Procedure(s): VENTRAL HERNIA REPAIR (N/A) INSERTION OF MESH (N/A) as a surgical intervention.    The patient's history has been reviewed, patient examined, no change in status, stable for surgery.  I have reviewed the patient's chart and labs.  Questions were answered to the patient's satisfaction.    Armandina Gemma, [facility]: [place]

## 2019-04-19 NOTE — Anesthesia Preprocedure Evaluation (Signed)
Anesthesia Evaluation  Patient identified by MRN, date of birth, ID band Patient awake    Reviewed: Allergy & Precautions, NPO status , Patient's Chart, lab work & pertinent test results  Airway Mallampati: II  TM Distance: <3 FB Neck ROM: Full    Dental no notable dental hx.    Pulmonary Current Smoker,    Pulmonary exam normal breath sounds clear to auscultation + decreased breath sounds      Cardiovascular negative cardio ROS Normal cardiovascular exam Rhythm:Regular Rate:Normal     Neuro/Psych Chronic pain on methadone negative psych ROS   GI/Hepatic negative GI ROS, Neg liver ROS,   Endo/Other  Morbid obesity  Renal/GU negative Renal ROS  negative genitourinary   Musculoskeletal negative musculoskeletal ROS (+)   Abdominal   Peds negative pediatric ROS (+)  Hematology negative hematology ROS (+)   Anesthesia Other Findings   Reproductive/Obstetrics negative OB ROS                             Anesthesia Physical Anesthesia Plan  ASA: III  Anesthesia Plan: General   Post-op Pain Management:    Induction: Intravenous and Rapid sequence  PONV Risk Score and Plan: 2 and Ondansetron, Dexamethasone and Treatment may vary due to age or medical condition  Airway Management Planned: Oral ETT  Additional Equipment:   Intra-op Plan:   Post-operative Plan: Extubation in OR  Informed Consent: I have reviewed the patients History and Physical, chart, labs and discussed the procedure including the risks, benefits and alternatives for the proposed anesthesia with the patient or authorized representative who has indicated his/her understanding and acceptance.     Dental advisory given  Plan Discussed with: CRNA and Surgeon  Anesthesia Plan Comments:         Anesthesia Quick Evaluation

## 2019-04-22 ENCOUNTER — Encounter (HOSPITAL_BASED_OUTPATIENT_CLINIC_OR_DEPARTMENT_OTHER): Payer: Self-pay | Admitting: Surgery

## 2019-07-28 ENCOUNTER — Other Ambulatory Visit: Payer: Self-pay

## 2019-07-28 ENCOUNTER — Ambulatory Visit (HOSPITAL_COMMUNITY)
Admission: EM | Admit: 2019-07-28 | Discharge: 2019-07-28 | Disposition: A | Discharge status: Home / Self Care | Payer: Medicare Other | Attending: Internal Medicine | Admitting: Internal Medicine

## 2019-07-28 ENCOUNTER — Encounter (HOSPITAL_COMMUNITY): Payer: Self-pay

## 2019-07-28 DIAGNOSIS — Z760 Encounter for issue of repeat prescription: Secondary | ICD-10-CM | POA: Diagnosis not present

## 2019-07-28 DIAGNOSIS — T7840XA Allergy, unspecified, initial encounter: Secondary | ICD-10-CM | POA: Diagnosis present

## 2019-07-28 DIAGNOSIS — R220 Localized swelling, mass and lump, head: Secondary | ICD-10-CM | POA: Insufficient documentation

## 2019-07-28 DIAGNOSIS — F411 Generalized anxiety disorder: Secondary | ICD-10-CM

## 2019-07-28 DIAGNOSIS — Z113 Encounter for screening for infections with a predominantly sexual mode of transmission: Secondary | ICD-10-CM

## 2019-07-28 LAB — CBC
HCT: 34.9 % — ABNORMAL LOW (ref 36.0–46.0)
Hemoglobin: 10.9 g/dL — ABNORMAL LOW (ref 12.0–15.0)
MCH: 26.1 pg (ref 26.0–34.0)
MCHC: 31.2 g/dL (ref 30.0–36.0)
MCV: 83.5 fL (ref 80.0–100.0)
Platelets: 192 10*3/uL (ref 150–400)
RBC: 4.18 MIL/uL (ref 3.87–5.11)
RDW: 15.7 % — ABNORMAL HIGH (ref 11.5–15.5)
WBC: 3.8 10*3/uL — ABNORMAL LOW (ref 4.0–10.5)
nRBC: 0 % (ref 0.0–0.2)

## 2019-07-28 LAB — HIV ANTIBODY (ROUTINE TESTING W REFLEX): HIV Screen 4th Generation wRfx: NONREACTIVE

## 2019-07-28 LAB — COMPREHENSIVE METABOLIC PANEL
ALT: 16 U/L (ref 0–44)
AST: 21 U/L (ref 15–41)
Albumin: 3.5 g/dL (ref 3.5–5.0)
Alkaline Phosphatase: 85 U/L (ref 38–126)
Anion gap: 10 (ref 5–15)
BUN: 14 mg/dL (ref 6–20)
CO2: 23 mmol/L (ref 22–32)
Calcium: 8.7 mg/dL — ABNORMAL LOW (ref 8.9–10.3)
Chloride: 103 mmol/L (ref 98–111)
Creatinine, Ser: 0.83 mg/dL (ref 0.44–1.00)
GFR calc Af Amer: >60 mL/min (ref >60)
GFR calc non Af Amer: >60 mL/min (ref >60)
Glucose, Bld: 85 mg/dL (ref 70–99)
Potassium: 4 mmol/L (ref 3.5–5.1)
Sodium: 136 mmol/L (ref 135–145)
Total Bilirubin: 0.4 mg/dL (ref 0.3–1.2)
Total Protein: 6.7 g/dL (ref 6.5–8.1)

## 2019-07-28 MED ORDER — FAMOTIDINE 20 MG PO TABS: 20.0000 mg | ORAL_TABLET | Freq: Two times a day (BID) | ORAL | 0 refills | Status: DC

## 2019-07-28 MED ORDER — HYDROXYZINE HCL 25 MG PO TABS: 25.0000 mg | ORAL_TABLET | Freq: Four times a day (QID) | ORAL | 0 refills | Status: DC

## 2019-07-28 MED ORDER — PAROXETINE HCL 20 MG PO TABS: 20.0000 mg | ORAL_TABLET | Freq: Every day | ORAL | 2 refills | Status: DC

## 2019-07-28 MED ORDER — PREDNISONE 20 MG PO TABS: 20.0000 mg | ORAL_TABLET | Freq: Every day | ORAL | 0 refills | Status: AC

## 2019-07-28 NOTE — ED Provider Notes (Signed)
[facility]    CSN: [id number] Arrival date & time: 07/28/19  1004      History   Chief Complaint Chief Complaint  Patient presents with  . [id number] Reaction    Face    HPI Paula Leon is a 53 y.o. 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  Patient started using a new make-up product a few days ago.  After application patient started experiencing itching over the cheeks and around the lips.  She applied it again the next day and she noticed swelling in addition to itching.  She denied any tongue swelling.  No shortness of breath, wheezing, cough or chest tightness.  Patient denies any history of allergies.  No changes in her routine medications.  Patient denies any swelling in the lower extremities.  She has not tried any over-the-counter medications.   Patient is also requesting STD screening.  HPI  Past Medical History:  Diagnosis Date  . Anemia   . Anxiety   . Depression   . Hernia of abdominal wall   . History of drug abuse (HCC)    has not used in ten years    Patient Active Problem List   Diagnosis Date Noted  . Ventral hernia 04/14/2019    Past Surgical History:  Procedure Laterality Date  . INSERTION OF MESH N/A 04/19/2019   Procedure: INSERTION OF MESH;  Surgeon: Darnell LevelGerkin, Todd, MD;  Location: [facility];  Service: General;  Laterality: N/A;  . TUBAL LIGATION    . VENTRAL HERNIA REPAIR N/A 04/19/2019   Procedure: VENTRAL HERNIA REPAIR;  Surgeon: Darnell LevelGerkin, Todd, MD;  Location:  SUR[facility]ERY [facility];  Service: General;  Laterality: N/A;    OB History   No obstetric history on file.      Home Medications    Prior to Admission medications   Medication Sig Start Date End Date Taking? Authorizing Provider  famotidine (PEPCID) 20 MG tablet Take 1 tablet (20 mg total) by mouth 2 (two) times daily. 07/28/19   LampteyBritta Mccreedy, La Shehan  O, MD  ferrous sulfate 325 (65 FE) MG tablet Take 325 mg by mouth 2 (two) times daily with a meal.    [provider]  HYDROcodone-acetaminophen (NORCO/VICODIN) 5-325 MG tablet Take 1-2 tablets by mouth every 4 (four) hours as needed for moderate pain. 04/19/19   Darnell LevelGerkin, Todd, MD  hydrOXYzine (ATARAX/VISTARIL) 25 MG tablet Take 1 tablet (25 mg total) by mouth every 6 (six) hours. 07/28/19   Merrilee JanskyLamptey, Karver Fadden O, MD  methadone (DOLOPHINE) 10 MG/5ML solution Take 40 mg by mouth daily.    [provider]  ondansetron (ZOFRAN-ODT) 4 MG disintegrating tablet Take 1 tablet (4 mg total) by mouth every 8 (eight) hours as needed for nausea or vomiting. 02/23/19   Benjiman CorePickering, Nathan, MD  PARoxetine (PAXIL) 20 MG tablet Take 1 tablet (20 mg total) by mouth daily. 07/28/19   Smiley Birr, Britta MccreedyPhilip O, MD  predniSONE (DELTASONE) 20 MG tablet Take 1 tablet (20 mg total) by mouth daily for 5 days. 07/28/19 08/02/19  Merrilee JanskyLamptey, Limuel Nieblas O, MD    Family History Family History  Problem Relation Age of Onset  . Cancer Mother   . Cancer Father     Social History Social History   Tobacco Use  . Smoking status: Current Every Day Smoker    Packs/day: 0.25    Types: Cigarettes  .  Smokeless tobacco: Never Used  . Tobacco comment: 1-2 cigarettes per day  Substance Use Topics  . Alcohol use: Never    Frequency: Never  . Drug use: Never    Comment: last time used drugs was ten years ago     Allergies   Patient has no known allergies.   Review of Systems Review of Systems  Constitutional: Negative for activity change, chills, fatigue and fever.  HENT: Positive for facial swelling. Negative for congestion, drooling, ear pain, hearing loss, mouth sores, postnasal drip, rhinorrhea, sinus pressure, sneezing and sore throat.   Eyes: Negative.  Negative for pain, discharge, redness and itching.  Respiratory: Negative.   Gastrointestinal: Negative.   Endocrine: Negative.   Genitourinary: Negative.  Negative  for dysuria, flank pain, hematuria and urgency.  Musculoskeletal: Negative.   Skin: Negative for color change, rash and wound.  Neurological: Negative for seizures, facial asymmetry, weakness and headaches.     Physical Exam Triage Vital Signs ED Triage Vitals  Enc Vitals Group     BP 07/28/19 1019 (!) 142/90     Pulse Rate 07/28/19 1019 89     Resp 07/28/19 1019 16     Temp 07/28/19 1019 98.1 F (36.7 C)     Temp Source 07/28/19 1019 Temporal     SpO2 07/28/19 1019 98 %     Weight --      Height --      Head Circumference --      Peak Flow --      Pain Score 07/28/19 1021 0     Pain Loc --      Pain Edu? --      Excl. in [place]? --    No data found.  Updated Vital Signs BP (!) 142/90 (BP Location: Left Arm)   Pulse 89   Temp 98.1 F (36.7 C) (Temporal)   Resp 16   SpO2 98%   Visual Acuity Right Eye Distance:   Left Eye Distance:   Bilateral Distance:    Right Eye Near:   Left Eye Near:    Bilateral Near:     Physical Exam Vitals signs and nursing note reviewed.  Constitutional:      General: She is not in acute distress.    Appearance: She is not ill-appearing.  HENT:     Head: Normocephalic and atraumatic.     Right Ear: Tympanic membrane normal.     Left Ear: Tympanic membrane normal.     Nose: Nose normal. No congestion or rhinorrhea.     Mouth/Throat:     Mouth: Mucous membranes are moist.     Pharynx: No oropharyngeal exudate or posterior oropharyngeal erythema.  Eyes:     General: No scleral icterus.       Right eye: No discharge.        Left eye: No discharge.     Extraocular Movements: Extraocular movements intact.     Conjunctiva/sclera: Conjunctivae normal.     Comments: Facial swelling in the periorbital area as well as the cheeks.  Neck:     Musculoskeletal: No muscular tenderness.  Cardiovascular:     Rate and Rhythm: Normal rate and regular rhythm.     Pulses: Normal pulses.     Heart sounds: Normal heart sounds. No murmur. No gallop.    Pulmonary:     Effort: Pulmonary effort is normal.     Breath sounds: Normal breath sounds.  Abdominal:     General: Bowel sounds are normal.  Palpations: Abdomen is soft.  Musculoskeletal: Normal range of motion.  Lymphadenopathy:     Cervical: No cervical adenopathy.  Skin:    General: Skin is warm.     Capillary Refill: Capillary refill takes less than 2 seconds.     Findings: No bruising, erythema or lesion.  Neurological:     General: No focal deficit present.     Mental Status: She is alert and oriented to person, place, and time.      UC Treatments / Results  Labs (all labs ordered are listed, but only abnormal results are displayed) Labs Reviewed  CBC  COMPREHENSIVE METABOLIC PANEL  HIV ANTIBODY (ROUTINE TESTING W REFLEX)  RPR  CERVICOVAGINAL ANCILLARY ONLY    EKG   Radiology No results found.  Procedures Procedures (including critical care time)  Medications Ordered in UC Medications - No data to display  Initial Impression / Assessment and Plan / UC Course  I have reviewed the triage vital signs and the nursing notes.  Pertinent labs & imaging results that were available during my care of the patient were reviewed by me and considered in my medical decision making (see chart for details).     1. Facial swelling: Prednisone 20 mg orally daily for 5 days Hydroxyzine 25 mg as needed for itching Famotidine 20 mg orally daily for 5 days Patient is advised to discontinue cosmetics use.  Apply aloe vera lotion over the face. If patient develops any tongue swelling, shortness of breath, stridor or wheezing she is advised to go to the emergency department to be reevaluated. CBC, BMP, urinalysis.  2.  Generalized anxiety disorder: Paroxetine has been filled  3.  Patient has been referred to [facility] health internal medicine to establish primary care services.  Final Clinical Impressions(s) / UC Diagnoses   Final diagnoses:  Facial swelling  Allergic  reaction, initial encounter   Discharge Instructions   None    ED Prescriptions    Medication Sig Dispense Auth. Provider   PARoxetine (PAXIL) 20 MG tablet Take 1 tablet (20 mg total) by mouth daily. 90 tablet Cobin Cadavid, Britta Mccreedy, MD   predniSONE (DELTASONE) 20 MG tablet Take 1 tablet (20 mg total) by mouth daily for 5 days. 5 tablet Eeva Schlosser, Britta Mccreedy, MD   hydrOXYzine (ATARAX/VISTARIL) 25 MG tablet Take 1 tablet (25 mg total) by mouth every 6 (six) hours. 30 tablet Mathias Bogacki, Britta Mccreedy, MD   famotidine (PEPCID) 20 MG tablet Take 1 tablet (20 mg total) by mouth 2 (two) times daily. 30 tablet Brooklyn Jeff, Britta Mccreedy, MD     PDMP not reviewed this encounter.   Merrilee Jansky, MD 07/28/19 1209

## 2019-07-28 NOTE — ED Triage Notes (Signed)
Pt present Allergic reaction to her face, she states that on Friday she put on some Make up and notice her face started itching.  Then on Saturday morning her face was swollen.

## 2019-07-29 LAB — POCT URINALYSIS DIP (DEVICE)
Bilirubin Urine: NEGATIVE
Glucose, UA: NEGATIVE mg/dL
Hgb urine dipstick: NEGATIVE
Ketones, ur: NEGATIVE mg/dL
Nitrite: NEGATIVE
Protein, ur: NEGATIVE mg/dL
Specific Gravity, Urine: 1.025 (ref 1.005–1.030)
Urobilinogen, UA: 1 mg/dL (ref 0.0–1.0)
pH: 6.5 (ref 5.0–8.0)

## 2019-07-29 LAB — RPR: RPR Ser Ql: NONREACTIVE

## 2019-07-30 LAB — CERVICOVAGINAL ANCILLARY ONLY
Chlamydia: NEGATIVE
Neisseria Gonorrhea: NEGATIVE
Trichomonas: NEGATIVE

## 2019-08-23 ENCOUNTER — Ambulatory Visit (HOSPITAL_COMMUNITY)
Admission: EM | Admit: 2019-08-23 | Discharge: 2019-08-23 | Disposition: A | Discharge status: Home / Self Care | Payer: Medicare Other | Attending: Family Medicine | Admitting: Family Medicine

## 2019-08-23 ENCOUNTER — Encounter (HOSPITAL_COMMUNITY): Payer: Self-pay

## 2019-08-23 ENCOUNTER — Other Ambulatory Visit: Payer: Self-pay

## 2019-08-23 DIAGNOSIS — F17210 Nicotine dependence, cigarettes, uncomplicated: Secondary | ICD-10-CM | POA: Insufficient documentation

## 2019-08-23 DIAGNOSIS — K0889 Other specified disorders of teeth and supporting structures: Secondary | ICD-10-CM | POA: Diagnosis present

## 2019-08-23 DIAGNOSIS — F329 Major depressive disorder, single episode, unspecified: Secondary | ICD-10-CM | POA: Diagnosis not present

## 2019-08-23 DIAGNOSIS — Z20828 Contact with and (suspected) exposure to other viral communicable diseases: Secondary | ICD-10-CM | POA: Diagnosis not present

## 2019-08-23 DIAGNOSIS — R519 Headache, unspecified: Secondary | ICD-10-CM | POA: Diagnosis not present

## 2019-08-23 DIAGNOSIS — K047 Periapical abscess without sinus: Secondary | ICD-10-CM | POA: Diagnosis not present

## 2019-08-23 DIAGNOSIS — K029 Dental caries, unspecified: Secondary | ICD-10-CM | POA: Diagnosis not present

## 2019-08-23 DIAGNOSIS — Z79899 Other long term (current) drug therapy: Secondary | ICD-10-CM | POA: Insufficient documentation

## 2019-08-23 DIAGNOSIS — F419 Anxiety disorder, unspecified: Secondary | ICD-10-CM | POA: Insufficient documentation

## 2019-08-23 MED ORDER — PENICILLIN V POTASSIUM 500 MG PO TABS: 500.0000 mg | ORAL_TABLET | Freq: Four times a day (QID) | ORAL | 0 refills | Status: AC

## 2019-08-23 MED ORDER — IBUPROFEN 800 MG PO TABS: 800.0000 mg | ORAL_TABLET | Freq: Three times a day (TID) | ORAL | 0 refills | Status: DC

## 2019-08-23 MED ORDER — KETOROLAC TROMETHAMINE 60 MG/2ML IM SOLN
INTRAMUSCULAR | Status: AC
  Filled 2019-08-23: qty 2

## 2019-08-23 MED ORDER — KETOROLAC TROMETHAMINE 60 MG/2ML IM SOLN
60.0000 mg | Freq: Once | INTRAMUSCULAR | Status: AC
  Administered 2019-08-23: 60 mg via INTRAMUSCULAR

## 2019-08-23 NOTE — ED Triage Notes (Signed)
Pt presents with left side dental pain and headache X 1 week with no relief with OTC medication.

## 2019-08-23 NOTE — Discharge Instructions (Signed)
Home to rest Take the antibiotic as directed Follow up with a dentist Take ibuprofen for pain

## 2019-08-23 NOTE — ED Provider Notes (Signed)
[facility]    CSN: [id number] Arrival date & time: 08/23/19  1050      History   Chief Complaint Chief Complaint  Patient presents with  . Dental Pain  . Headache    HPI Paula Leon is a 53 y.o. female.   HPI  Patient is here for headache.  Is been present for a week.  No relief with over-the-counter medications.  She has a history of migraines.  She is not having any photophobia.  No nausea.  She states she is having dental pain from an infection.  Her right jaw is painful, the teeth are broken.  She states that the gums are red and raw. Patient is on methadone for history of heroin addiction  Past Medical History:  Diagnosis Date  . Anemia   . Anxiety   . Depression   . Hernia of abdominal wall   . History of drug abuse (HCC)    has not used in ten years    Patient Active Problem List   Diagnosis Date Noted  . Ventral hernia 04/14/2019    Past Surgical History:  Procedure Laterality Date  . INSERTION OF MESH N/A 04/19/2019   Procedure: INSERTION OF MESH;  Surgeon: Darnell LevelGerkin, Todd, MD;  Location: [facility];  Service: General;  Laterality: N/A;  . TUBAL LIGATION    . VENTRAL HERNIA REPAIR N/A 04/19/2019   Procedure: VENTRAL HERNIA REPAIR;  Surgeon: Darnell LevelGerkin, Todd, MD;  Location:  [facility];  Service: General;  Laterality: N/A;    OB History   No obstetric history on file.      Home Medications    Prior to Admission medications   Medication Sig Start Date End Date Taking? Authorizing Provider  famotidine (PEPCID) 20 MG tablet Take 1 tablet (20 mg total) by mouth 2 (two) times daily. 07/28/19   LampteyBritta Mccreedy, Philip O, MD  ferrous sulfate 325 (65 FE) MG tablet Take 325 mg by mouth 2 (two) times daily with a meal.    [provider]  HYDROcodone-acetaminophen (NORCO/VICODIN) 5-325 MG tablet Take 1-2 tablets by mouth every 4 (four) hours as needed for moderate pain. 04/19/19   Darnell LevelGerkin, Todd, MD  hydrOXYzine (ATARAX/VISTARIL)  25 MG tablet Take 1 tablet (25 mg total) by mouth every 6 (six) hours. 07/28/19   Merrilee JanskyLamptey, Philip O, MD  ibuprofen (ADVIL) 800 MG tablet Take 1 tablet (800 mg total) by mouth 3 (three) times daily. 08/23/19   Eustace MooreNelson, Aracelli Woloszyn Sue, MD  methadone (DOLOPHINE) 10 MG/5ML solution Take 40 mg by mouth daily.    [provider]  ondansetron (ZOFRAN-ODT) 4 MG disintegrating tablet Take 1 tablet (4 mg total) by mouth every 8 (eight) hours as needed for nausea or vomiting. 02/23/19   Benjiman CorePickering, Nathan, MD  PARoxetine (PAXIL) 20 MG tablet Take 1 tablet (20 mg total) by mouth daily. 07/28/19   Lamptey, Britta MccreedyPhilip O, MD  penicillin v potassium (VEETID) 500 MG tablet Take 1 tablet (500 mg total) by mouth 4 (four) times daily for 10 days. 08/23/19 09/02/19  Eustace MooreNelson, Helia Haese Sue, MD    Family History Family History  Problem Relation Age of Onset  . Cancer Mother   . Cancer Father     Social History Social History   Tobacco Use  . Smoking status: Current Every Day Smoker    Packs/day: 0.25    Types: Cigarettes  . Smokeless tobacco: Never Used  . Tobacco comment: 1-2 cigarettes per day  Substance Use Topics  .  Alcohol use: Never    Frequency: Never  . Drug use: Never    Comment: last time used drugs was ten years ago     Allergies   Patient has no known allergies.   Review of Systems Review of Systems  Constitutional: Negative for chills and fever.  HENT: Positive for dental problem. Negative for ear pain and sore throat.   Eyes: Negative for photophobia, pain and visual disturbance.  Respiratory: Negative for cough and shortness of breath.   Cardiovascular: Negative for chest pain and palpitations.  Gastrointestinal: Negative for abdominal pain, nausea and vomiting.  Genitourinary: Negative for dysuria and hematuria.  Musculoskeletal: Negative for arthralgias and back pain.  Skin: Negative for color change and rash.  Neurological: Positive for headaches. Negative for seizures and syncope.   All other systems reviewed and are negative.    Physical Exam Triage Vital Signs ED Triage Vitals  Enc Vitals Group     BP 08/23/19 1203 133/79     Pulse Rate 08/23/19 1203 77     Resp 08/23/19 1203 18     Temp 08/23/19 1203 98.2 F (36.8 C)     Temp Source 08/23/19 1203 Oral     SpO2 08/23/19 1203 100 %     Weight --      Height --      Head Circumference --      Peak Flow --      Pain Score 08/23/19 1205 10     Pain Loc --      Pain Edu? --      Excl. in [place]? --    No data found.  Updated Vital Signs BP 133/79 (BP Location: Right Arm)   Pulse 77   Temp 98.2 F (36.8 C) (Oral)   Resp 18   LMP 08/06/2019   SpO2 100%       Physical Exam Constitutional:      General: She is not in acute distress.    Appearance: She is well-developed. She is ill-appearing.     Comments: Crying  HENT:     Head: Normocephalic and atraumatic.     Mouth/Throat:     Mouth: Mucous membranes are moist.     Comments: On the left posterior molars are fractured off at the gumline, carious, the gums are friable and erythematous Eyes:     Extraocular Movements:     Right eye: Normal extraocular motion and no nystagmus.     Left eye: Normal extraocular motion and no nystagmus.     Conjunctiva/sclera: Conjunctivae normal.     Pupils: Pupils are equal, round, and reactive to light.     Right eye: Pupil is round and reactive.     Left eye: Pupil is round and reactive.  Neck:     Musculoskeletal: Normal range of motion. No neck rigidity.  Cardiovascular:     Rate and Rhythm: Normal rate and regular rhythm.     Heart sounds: Normal heart sounds.  Pulmonary:     Effort: Pulmonary effort is normal. No respiratory distress.     Breath sounds: Normal breath sounds.  Abdominal:     General: There is no distension.     Palpations: Abdomen is soft.  Musculoskeletal: Normal range of motion.  Skin:    General: Skin is warm and dry.  Neurological:     Mental Status: She is alert.     Cranial  Nerves: No cranial nerve deficit, dysarthria or facial asymmetry.     Sensory:  No sensory deficit.     Gait: Gait normal.  Psychiatric:        Mood and Affect: Mood normal.        Behavior: Behavior normal.      UC Treatments / Results  Labs (all labs ordered are listed, but only abnormal results are displayed) Labs Reviewed  NOVEL CORONAVIRUS, NAA (HOSP ORDER, SEND-OUT TO REF LAB; TAT 18-24 HRS)    EKG   Radiology No results found.  Procedures Procedures (including critical care time)  Medications Ordered in UC Medications  ketorolac (TORADOL) injection 60 mg (60 mg Intramuscular Given 08/23/19 1241)  ketorolac (TORADOL) 60 MG/2ML injection (has no administration in time range)    Initial Impression / Assessment and Plan / UC Course  I have reviewed the triage vital signs and the nursing notes.  Pertinent labs & imaging results that were available during my care of the patient were reviewed by me and considered in my medical decision making (see chart for details).     Because of the headache we will get coronavirus testing.  She has no other symptoms.  No known exposure. Discussed with patient that she needs to see a dentist in order to definitively take care of her caries and fractures Final Clinical Impressions(s) / UC Diagnoses   Final diagnoses:  Dental infection  Acute intractable headache, unspecified headache type     Discharge Instructions     Home to rest Take the antibiotic as directed Follow up with a dentist Take ibuprofen for pain   ED Prescriptions    Medication Sig Dispense Auth. Provider   penicillin v potassium (VEETID) 500 MG tablet Take 1 tablet (500 mg total) by mouth 4 (four) times daily for 10 days. 40 tablet Eustace Moore, MD   ibuprofen (ADVIL) 800 MG tablet Take 1 tablet (800 mg total) by mouth 3 (three) times daily. 21 tablet Eustace Moore, MD     PDMP not reviewed this encounter.   Eustace Moore, MD 08/23/19  [id number]

## 2019-08-26 LAB — NOVEL CORONAVIRUS, NAA (HOSP ORDER, SEND-OUT TO REF LAB; TAT 18-24 HRS): SARS-CoV-2, NAA: NOT DETECTED

## 2019-09-03 ENCOUNTER — Encounter (HOSPITAL_COMMUNITY): Payer: Self-pay | Admitting: Emergency Medicine

## 2019-09-03 ENCOUNTER — Emergency Department (HOSPITAL_COMMUNITY): Payer: Medicare Other

## 2019-09-03 ENCOUNTER — Emergency Department (HOSPITAL_COMMUNITY)
Admission: EM | Admit: 2019-09-03 | Discharge: 2019-09-03 | Disposition: A | Discharge status: Home / Self Care | Payer: Medicare Other | Attending: Emergency Medicine | Admitting: Emergency Medicine

## 2019-09-03 ENCOUNTER — Other Ambulatory Visit: Payer: Self-pay

## 2019-09-03 DIAGNOSIS — Y93I9 Activity, other involving external motion: Secondary | ICD-10-CM | POA: Insufficient documentation

## 2019-09-03 DIAGNOSIS — S0990XA Unspecified injury of head, initial encounter: Secondary | ICD-10-CM | POA: Insufficient documentation

## 2019-09-03 DIAGNOSIS — Y999 Unspecified external cause status: Secondary | ICD-10-CM | POA: Diagnosis not present

## 2019-09-03 DIAGNOSIS — Z23 Encounter for immunization: Secondary | ICD-10-CM | POA: Diagnosis not present

## 2019-09-03 DIAGNOSIS — S0181XA Laceration without foreign body of other part of head, initial encounter: Secondary | ICD-10-CM | POA: Insufficient documentation

## 2019-09-03 DIAGNOSIS — Y92410 Unspecified street and highway as the place of occurrence of the external cause: Secondary | ICD-10-CM | POA: Diagnosis not present

## 2019-09-03 DIAGNOSIS — F17210 Nicotine dependence, cigarettes, uncomplicated: Secondary | ICD-10-CM | POA: Diagnosis not present

## 2019-09-03 DIAGNOSIS — Z79899 Other long term (current) drug therapy: Secondary | ICD-10-CM | POA: Diagnosis not present

## 2019-09-03 MED ORDER — TETANUS-DIPHTH-ACELL PERTUSSIS 5-2.5-18.5 LF-MCG/0.5 IM SUSP
0.5000 mL | Freq: Once | INTRAMUSCULAR | Status: AC
  Administered 2019-09-03: 0.5 mL via INTRAMUSCULAR
  Filled 2019-09-03: qty 0.5

## 2019-09-03 MED ORDER — HYDROCODONE-ACETAMINOPHEN 5-325 MG PO TABS
1.0000 | ORAL_TABLET | Freq: Once | ORAL | Status: AC
  Administered 2019-09-03: 12:00:00 1 via ORAL
  Filled 2019-09-03: qty 1
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
## 2019-09-03 NOTE — ED Notes (Signed)
Khadisjah Lane(daughter)- [phone] Bysheka Dibbs(daughter)- [phone] both looking for an update on pt, aware patient just got here
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
## 2019-09-03 NOTE — Discharge Instructions (Addendum)
Your facial wounds were closed with tissue adhesive.  You do not need to do much to the wounds, until the tissue adhesive falls off.  If you are concerned about infection because of pain, swelling or bleeding, return here for a checkup.  The CAT scans did not show any serious injuries to head or face.  It is good idea to use ice on sore spots 3-4 times a day for 2 days, after that use heat.  For pain use Tylenol or Motrin.

## 2019-09-03 NOTE — ED Notes (Signed)
Family at bedside. 

## 2020-03-10 ENCOUNTER — Other Ambulatory Visit: Payer: Self-pay

## 2020-03-10 ENCOUNTER — Encounter (HOSPITAL_COMMUNITY): Payer: Self-pay

## 2020-03-10 ENCOUNTER — Ambulatory Visit (HOSPITAL_COMMUNITY)
Admission: EM | Admit: 2020-03-10 | Discharge: 2020-03-10 | Disposition: A | Discharge status: Home / Self Care | Payer: Medicare Other | Attending: Family Medicine | Admitting: Family Medicine

## 2020-03-10 DIAGNOSIS — D508 Other iron deficiency anemias: Secondary | ICD-10-CM | POA: Insufficient documentation

## 2020-03-10 DIAGNOSIS — M545 Low back pain, unspecified: Secondary | ICD-10-CM

## 2020-03-10 LAB — CBC
HCT: 34 % — ABNORMAL LOW (ref 36.0–46.0)
Hemoglobin: 10.3 g/dL — ABNORMAL LOW (ref 12.0–15.0)
MCH: 23.2 pg — ABNORMAL LOW (ref 26.0–34.0)
MCHC: 30.3 g/dL (ref 30.0–36.0)
MCV: 76.6 fL — ABNORMAL LOW (ref 80.0–100.0)
Platelets: 244 10*3/uL (ref 150–400)
RBC: 4.44 MIL/uL (ref 3.87–5.11)
RDW: 15.9 % — ABNORMAL HIGH (ref 11.5–15.5)
WBC: 4.4 10*3/uL (ref 4.0–10.5)
nRBC: 0 % (ref 0.0–0.2)

## 2020-03-10 MED ORDER — IBUPROFEN 600 MG PO TABS: 600.0000 mg | ORAL_TABLET | Freq: Three times a day (TID) | ORAL | 0 refills | Status: DC | PRN

## 2020-03-10 MED ORDER — CYCLOBENZAPRINE HCL 10 MG PO TABS: 5.0000 mg | ORAL_TABLET | Freq: Every day | ORAL | 0 refills | Status: DC

## 2020-03-10 NOTE — ED Triage Notes (Signed)
Pt presents with back pain and stomach pain after wearing "waist trainer". Pt denies any injury or obvious trauma to back. Pt denies OTC therapy or relieving factors. Pt states her posture has worsened, but has full ROM. Pt also requesting referral to PCP.

## 2020-03-10 NOTE — Discharge Instructions (Addendum)
Believe that your back pain is most likely muscular due to overuse, straining from heavy lifting. He can do ibuprofen 600 mg every 8 hours as needed Flexeril low-dose at bedtime to help relax the muscles.  Heat and back exercises We have put some of these for example in your discharge instructions We will check your hemoglobin today based on your history of anemia We will call you with any abnormal results

## 2020-03-10 NOTE — ED Provider Notes (Signed)
[facility]    CSN: [id number] Arrival date & time: 03/10/20  1018      History   Chief Complaint Chief Complaint  Patient presents with   Back Pain    HPI Paula Leon is a 54 y.o. female.   Pt is a 54 year old female presenting with complaints of low back pain x 1 month. 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 Denies recent trauma or injury. Pain worse in morning, alleviated with gentle movements throughout the day. Pain aggravated with movement. No alleviating measures taken. Denies urinary frequency, burning with urination, blood in urine or stool, abdominal pain, nausea, vomiting, diarrhea. Reports increased fatigue and intermittent lightheadedness. Denies headaches, unilateral weakness, visual disturbances. History of anemia with blood transfusion, stopped taking iron a couple of months ago. Reports heavy menstrual cycles, LMP beginning of June.      Past Medical History:  Diagnosis Date   Anemia    Anxiety    Depression    Hernia of abdominal wall    History of drug abuse (HCC)    has not used in ten years    Patient Active Problem List   Diagnosis Date Noted   Ventral hernia 04/14/2019    Past Surgical History:  Procedure Laterality Date   INSERTION OF MESH N/A 04/19/2019   Procedure: INSERTION OF MESH;  Surgeon: Darnell Level, MD;  Location: [facility];  Service: General;  Laterality: N/A;   TUBAL LIGATION     VENTRAL HERNIA REPAIR N/A 04/19/2019   Procedure: VENTRAL HERNIA REPAIR;  Surgeon: Darnell Level, MD;  Location: [facility];  Service: General;  Laterality: N/A;    OB History   No obstetric history on file.      Home Medications    Prior to Admission medications   Medication Sig Start Date End Date Taking? Authorizing Provider  cyclobenzaprine (FLEXERIL) 10 MG tablet Take 0.5 tablets (5 mg total) by mouth at bedtime. 03/10/20   Dahlia Byes A, NP  ferrous sulfate 325 (65 FE) MG tablet  Take 325 mg by mouth 2 (two) times daily with a meal.    [provider]  ibuprofen (ADVIL) 600 MG tablet Take 1 tablet (600 mg total) by mouth every 8 (eight) hours as needed for moderate pain. 03/10/20   Dahlia Byes A, NP  methadone (DOLOPHINE) 10 MG/5ML solution Take 39 mg by mouth daily.     [provider]  PARoxetine (PAXIL) 20 MG tablet Take 1 tablet (20 mg total) by mouth daily. Patient taking differently: Take 20 mg by mouth every evening.  07/28/19   Merrilee Jansky, MD  famotidine (PEPCID) 20 MG tablet Take 1 tablet (20 mg total) by mouth 2 (two) times daily. Patient not taking: Reported on 09/03/2019 07/28/19 03/10/20  Merrilee Jansky, MD    Family History Family History  Problem Relation Age of Onset   Cancer Mother    Cancer Father     Social History Social History   Tobacco Use   Smoking status: Current Every Day Smoker    Packs/day: 0.25    Types: Cigarettes   Smokeless tobacco: Never Used   Tobacco comment: 1-2 cigarettes per day  Substance Use Topics   Alcohol use: Never   Drug use: Never    Comment: last time used drugs was ten years ago     Allergies   Patient has no known allergies.   Review of Systems Review of Systems  Constitutional: Positive  for appetite change and fatigue. Negative for fever and unexpected weight change.  HENT: Negative.   Eyes: Negative.   Respiratory: Negative for shortness of breath.   Cardiovascular: Negative for chest pain.  Gastrointestinal: Negative for abdominal distention, abdominal pain, diarrhea, nausea and vomiting.  Endocrine: Negative.   Genitourinary: Negative for difficulty urinating, dysuria, flank pain, frequency, hematuria, pelvic pain, vaginal discharge and vaginal pain.  Musculoskeletal: Positive for back pain.  Skin: Negative.   Allergic/Immunologic: Negative.   Neurological: Positive for dizziness and light-headedness. Negative for syncope, weakness, numbness and headaches.    Hematological: Negative.   Psychiatric/Behavioral: Negative.      Physical Exam Triage Vital Signs ED Triage Vitals  Enc Vitals Group     BP 03/10/20 1118 128/80     Pulse Rate 03/10/20 1118 92     Resp 03/10/20 1118 18     Temp 03/10/20 1115 98.7 F (37.1 C)     Temp Source 03/10/20 1115 Oral     SpO2 03/10/20 1118 100 %     Weight --      Height --      Head Circumference --      Peak Flow --      Pain Score 03/10/20 1118 5     Pain Loc --      Pain Edu? --      Excl. in [place]? --    No data found.  Updated Vital Signs BP 128/80 (BP Location: Right Arm)    Pulse 92    Temp 98.7 F (37.1 C)    Resp 18    LMP 01/25/2020 (Exact Date)    SpO2 100%   Visual Acuity Right Eye Distance:   Left Eye Distance:   Bilateral Distance:    Right Eye Near:   Left Eye Near:    Bilateral Near:     Physical Exam Constitutional:      Appearance: Normal appearance. She is obese.  HENT:     Head: Normocephalic.  Cardiovascular:     Rate and Rhythm: Normal rate and regular rhythm.     Pulses: Normal pulses.  Pulmonary:     Effort: Pulmonary effort is normal.  Abdominal:     Palpations: Abdomen is soft.  Musculoskeletal:        General: No swelling, tenderness, deformity or signs of injury. Normal range of motion.     Cervical back: Normal and normal range of motion. No tenderness.     Thoracic back: Normal. No tenderness.     Lumbar back: Normal. No tenderness. Negative right straight leg raise test and negative left straight leg raise test.  Skin:    General: Skin is warm and dry.     Capillary Refill: Capillary refill takes less than 2 seconds.  Neurological:     General: No focal deficit present.     Mental Status: She is alert.     GCS: GCS eye subscore is 4. GCS verbal subscore is 5. GCS motor subscore is 6.     Cranial Nerves: Cranial nerves are intact.     Sensory: Sensation is intact.     Motor: Motor function is intact. No weakness.     Coordination: Coordination  is intact.     Gait: Gait is intact.  Psychiatric:        Mood and Affect: Mood normal.        Behavior: Behavior normal.      UC Treatments / Results  Labs (all labs ordered  are listed, but only abnormal results are displayed) Labs Reviewed  CBC    EKG   Radiology No results found.  Procedures Procedures (including critical care time)  Medications Ordered in UC Medications - No data to display  Initial Impression / Assessment and Plan / UC Course  I have reviewed the triage vital signs and the nursing notes.  Pertinent labs & imaging results that were available during my care of the patient were reviewed by me and considered in my medical decision making (see chart for details).     Lower back pain without sciatica Most likely overuse of the or strain We will do low-dose muscle x-ray at bedtime.  600 of ibuprofen every 8 hours as needed Back exercises put on discharge instructions  Iron deficiency anemia Checking CBC Recommended restart her iron Final Clinical Impressions(s) / UC Diagnoses   Final diagnoses:  Acute bilateral low back pain without sciatica  Other iron deficiency anemia     Discharge Instructions     Believe that your back pain is most likely muscular due to overuse, straining from heavy lifting. He can do ibuprofen 600 mg every 8 hours as needed Flexeril low-dose at bedtime to help relax the muscles.  Heat and back exercises We have put some of these for example in your discharge instructions We will check your hemoglobin today based on your history of anemia We will call you with any abnormal results    ED Prescriptions    Medication Sig Dispense Auth. Provider   ibuprofen (ADVIL) 600 MG tablet Take 1 tablet (600 mg total) by mouth every 8 (eight) hours as needed for moderate pain. 30 tablet Jalayia Bagheri A, NP   cyclobenzaprine (FLEXERIL) 10 MG tablet Take 0.5 tablets (5 mg total) by mouth at bedtime. 20 tablet Dahlia Byes A, NP      PDMP not reviewed this encounter.   Janace Aris, NP 03/10/20 1201

## 2020-06-08 ENCOUNTER — Ambulatory Visit (HOSPITAL_COMMUNITY)
Admission: EM | Admit: 2020-06-08 | Discharge: 2020-06-08 | Disposition: A | Discharge status: Home / Self Care | Payer: Medicare Other

## 2020-06-08 ENCOUNTER — Other Ambulatory Visit: Payer: Self-pay

## 2020-06-10 ENCOUNTER — Encounter (HOSPITAL_COMMUNITY): Payer: Self-pay

## 2020-06-10 ENCOUNTER — Other Ambulatory Visit: Payer: Self-pay

## 2020-06-10 ENCOUNTER — Ambulatory Visit (HOSPITAL_COMMUNITY)
Admission: EM | Admit: 2020-06-10 | Discharge: 2020-06-10 | Disposition: A | Discharge status: Home / Self Care | Payer: Medicare Other | Attending: Family Medicine | Admitting: Family Medicine

## 2020-06-10 DIAGNOSIS — D509 Iron deficiency anemia, unspecified: Secondary | ICD-10-CM | POA: Diagnosis not present

## 2020-06-10 DIAGNOSIS — F331 Major depressive disorder, recurrent, moderate: Secondary | ICD-10-CM

## 2020-06-10 MED ORDER — FERROUS SULFATE 325 (65 FE) MG PO TABS: 325.0000 mg | ORAL_TABLET | Freq: Two times a day (BID) | ORAL | 0 refills | Status: DC

## 2020-06-10 MED ORDER — PAROXETINE HCL 20 MG PO TABS: 20.0000 mg | ORAL_TABLET | Freq: Every evening | ORAL | 0 refills | Status: DC

## 2020-06-10 NOTE — ED Provider Notes (Signed)
[facility]    CSN: [id number] Arrival date & time: 06/10/20  1004      History   Chief Complaint Chief Complaint  Patient presents with  . Medication Refill    HPI Paula Leon is a 54 y.o. female.   Patient presenting today requesting medication refills on her paxil and iron supplement. Long hx of anemia, with intermittent periods of fatigue. No recent changes symptomatically and no noticed bleeding events, CP, SOB, dizziness, syncope. Taking the paxil for 2 years now which seems to be going very well for moods and anxiety, denies SI/HI and has not had any panic episodes since being on the medication steadily. Denies side effects to either of these. She is very frustrated at how difficult it has been to find a PCP in the area, she moved here about 6 months ago and states nobody has been willing to take her as a new pt as of yet and she's called around for months.       Past Medical History:  Diagnosis Date  . Anemia   . Anxiety   . Depression   . Hernia of abdominal wall   . History of drug abuse (HCC)    has not used in ten years    Patient Active Problem List   Diagnosis Date Noted  . Ventral hernia 04/14/2019    Past Surgical History:  Procedure Laterality Date  . INSERTION OF MESH N/A 04/19/2019   Procedure: INSERTION OF MESH;  Surgeon: Darnell Level, MD;  Location: [facility];  Service: General;  Laterality: N/A;  . TUBAL LIGATION    . VENTRAL HERNIA REPAIR N/A 04/19/2019   Procedure: VENTRAL HERNIA REPAIR;  Surgeon: Darnell Level, MD;  Location: [facility];  Service: General;  Laterality: N/A;    OB History   No obstetric history on file.      Home Medications    Prior to Admission medications   Medication Sig Start Date End Date Taking? Authorizing Provider  cyclobenzaprine (FLEXERIL) 10 MG tablet Take 0.5 tablets (5 mg total) by mouth at bedtime. 03/10/20   Dahlia Byes A, NP  ferrous sulfate 325 (65 FE) MG tablet  Take 1 tablet (325 mg total) by mouth 2 (two) times daily with a meal. 06/10/20   Particia Nearing, PA-C  ibuprofen (ADVIL) 600 MG tablet Take 1 tablet (600 mg total) by mouth every 8 (eight) hours as needed for moderate pain. 03/10/20   Dahlia Byes A, NP  methadone (DOLOPHINE) 10 MG/5ML solution Take 39 mg by mouth daily.     [provider]  PARoxetine (PAXIL) 20 MG tablet Take 1 tablet (20 mg total) by mouth every evening. 06/10/20   Particia Nearing, PA-C  famotidine (PEPCID) 20 MG tablet Take 1 tablet (20 mg total) by mouth 2 (two) times daily. Patient not taking: Reported on 09/03/2019 07/28/19 03/10/20  Merrilee Jansky, MD    Family History Family History  Problem Relation Age of Onset  . Cancer Mother   . Cancer Father     Social History Social History   Tobacco Use  . Smoking status: Former Smoker    Packs/day: 0.25    Types: Cigarettes  . Smokeless tobacco: Never Used  . Tobacco comment: 1-2 cigarettes per day  Substance Use Topics  . Alcohol use: Never  . Drug use: Never    Comment: last time used drugs was ten years ago     Allergies   Penicillins  Review of Systems Review of Systems PER HPI    Physical Exam Triage Vital Signs ED Triage Vitals  Enc Vitals Group     BP 06/10/20 1232 127/70     Pulse Rate 06/10/20 1232 89     Resp 06/10/20 1232 16     Temp 06/10/20 1232 98.4 F (36.9 C)     Temp Source 06/10/20 1232 Oral     SpO2 06/10/20 1232 100 %     Weight 06/10/20 1233 208 lb (94.3 kg)     Height 06/10/20 1233 5\' 4"  (1.626 m)     Head Circumference --      Peak Flow --      Pain Score 06/10/20 1233 0     Pain Loc --      Pain Edu? --      Excl. in [place]? --    No data found.  Updated Vital Signs BP 127/70   Pulse 89   Temp 98.4 F (36.9 C) (Oral)   Resp 16   Ht 5\' 4"  (1.626 m)   Wt 208 lb (94.3 kg)   SpO2 100%   BMI 35.70 kg/m   Visual Acuity Right Eye Distance:   Left Eye Distance:   Bilateral Distance:     Right Eye Near:   Left Eye Near:    Bilateral Near:     Physical Exam Vitals and nursing note reviewed.  Constitutional:      Appearance: Normal appearance. She is not ill-appearing.  HENT:     Head: Atraumatic.     Nose: Nose normal.     Mouth/Throat:     Mouth: Mucous membranes are moist.  Eyes:     Extraocular Movements: Extraocular movements intact.     Conjunctiva/sclera: Conjunctivae normal.  Cardiovascular:     Rate and Rhythm: Normal rate and regular rhythm.     Heart sounds: Normal heart sounds.  Pulmonary:     Effort: Pulmonary effort is normal.     Breath sounds: Normal breath sounds. No wheezing or rales.  Abdominal:     General: Bowel sounds are normal. There is no distension.     Palpations: Abdomen is soft.     Tenderness: There is no abdominal tenderness. There is no guarding.  Musculoskeletal:        General: Normal range of motion.     Cervical back: Normal range of motion and neck supple.  Skin:    General: Skin is warm and dry.     Coloration: Skin is not pale.  Neurological:     Mental Status: She is alert and oriented to person, place, and time.  Psychiatric:        Mood and Affect: Mood normal.        Thought Content: Thought content normal.        Judgment: Judgment normal.      UC Treatments / Results  Labs (all labs ordered are listed, but only abnormal results are displayed) Labs Reviewed - No data to display  EKG   Radiology No results found.  Procedures Procedures (including critical care time)  Medications Ordered in UC Medications - No data to display  Initial Impression / Assessment and Plan / UC Course  I have reviewed the triage vital signs and the nursing notes.  Pertinent labs & imaging results that were available during my care of the patient were reviewed by me and considered in my medical decision making (see chart for details).  Anxiety and depression - paxil refilled, continue present regimen. She is  interested in cutting back to 10 mg and discussed trial of this and to restart full tab if not happy with how that affects her. Will work on getting a PCP - contact information given and placed an assistance request for her to be contacted for this.  Anemia - per chart review appears her baseline Hg is around 10 - 10/5, with recent labs within last few months stable. WIll refill supplemental iron and continue dietary supplementation as well. F/u with new PCP once established.   Final Clinical Impressions(s) / UC Diagnoses   Final diagnoses:  None     Discharge Instructions     [facility] [address] [place], Jaquita Rector [facility], [place], [place] [address] [phone]    ED Prescriptions    Medication Sig Dispense Auth. Provider   ferrous sulfate 325 (65 FE) MG tablet Take 1 tablet (325 mg total) by mouth 2 (two) times daily with a meal. 90 tablet Particia Nearing, PA-C   PARoxetine (PAXIL) 20 MG tablet Take 1 tablet (20 mg total) by mouth every evening. 90 tablet Particia Nearing, [place]     PDMP not reviewed this encounter.   Particia Nearing, [place] 06/10/20 1311

## 2020-06-10 NOTE — Discharge Instructions (Addendum)
[facility] [address] [place], [place] Rector [facility], [place], [place] [address] [phone]

## 2020-06-10 NOTE — ED Triage Notes (Signed)
Pt needs medication refill for iron and paroxetine 20mg .

## 2020-06-16 ENCOUNTER — Ambulatory Visit (INDEPENDENT_AMBULATORY_CARE_PROVIDER_SITE_OTHER): Payer: Medicare Other | Admitting: Student

## 2020-06-16 ENCOUNTER — Other Ambulatory Visit: Payer: Self-pay

## 2020-06-16 ENCOUNTER — Encounter: Payer: Self-pay | Admitting: Student

## 2020-06-16 VITALS — BP 121/77 | HR 83 | Wt 243.6 lb

## 2020-06-16 DIAGNOSIS — N920 Excessive and frequent menstruation with regular cycle: Secondary | ICD-10-CM | POA: Insufficient documentation

## 2020-06-16 DIAGNOSIS — Z0000 Encounter for general adult medical examination without abnormal findings: Secondary | ICD-10-CM | POA: Insufficient documentation

## 2020-06-16 DIAGNOSIS — F419 Anxiety disorder, unspecified: Secondary | ICD-10-CM | POA: Diagnosis not present

## 2020-06-16 DIAGNOSIS — Z720 Tobacco use: Secondary | ICD-10-CM

## 2020-06-16 DIAGNOSIS — N921 Excessive and frequent menstruation with irregular cycle: Secondary | ICD-10-CM

## 2020-06-16 DIAGNOSIS — Z1231 Encounter for screening mammogram for malignant neoplasm of breast: Secondary | ICD-10-CM

## 2020-06-16 DIAGNOSIS — F411 Generalized anxiety disorder: Secondary | ICD-10-CM | POA: Insufficient documentation

## 2020-06-16 DIAGNOSIS — F1121 Opioid dependence, in remission: Secondary | ICD-10-CM

## 2020-06-16 DIAGNOSIS — Z1211 Encounter for screening for malignant neoplasm of colon: Secondary | ICD-10-CM

## 2020-06-16 DIAGNOSIS — D500 Iron deficiency anemia secondary to blood loss (chronic): Secondary | ICD-10-CM

## 2020-06-16 DIAGNOSIS — F32A Depression, unspecified: Secondary | ICD-10-CM

## 2020-06-16 MED ORDER — PAROXETINE HCL 10 MG PO TABS: 10.0000 mg | ORAL_TABLET | Freq: Every evening | ORAL | 0 refills | Status: DC

## 2020-06-16 NOTE — Progress Notes (Signed)
   CC: heavy menstrual bleeding, "I need a PCP."  HPI:  [name] is a 54 y.o. 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  She moved to [place] ~2 years ago to be closer to her children, three of whom live in the area. Since moving to [facility], she has been seeking care at urgent cares and emergency departments.  Note: Patient signed release form today so that we can review her previous records. Today we focused on her heavy menstrual bleeding, anxiety/depression, and health maintenance.   To see the details of this patient's management of their acute and chronic problems, please refer to the Assessment & Plan under the Encounters tab.    Past Medical History:  Diagnosis Date  . Anemia   . Anxiety   . Depression   . Hernia of abdominal wall   . History of drug abuse (HCC)    has not used in ten years   Review of Systems:    Review of Systems  Constitutional: Positive for malaise/fatigue. Negative for chills, fever and weight loss.  HENT: Negative for congestion and sore throat.   Respiratory: Negative for cough and shortness of breath.   Cardiovascular: Negative for chest pain and leg swelling.  Gastrointestinal: Negative for abdominal pain, blood in stool, nausea and vomiting.  Genitourinary: Negative for dysuria.       Heavy vaginal bleeding  Neurological: Negative for dizziness, weakness and headaches.  Endo/Heme/Allergies: Does not bruise/bleed easily.  Psychiatric/Behavioral: Negative for depression.    Physical Exam:  Vitals:   06/16/20 1011  BP: 121/77  Pulse: 83  SpO2: 96%  Weight: 243 lb 9.6 oz (110.5 kg)   Constitutional: very pleasant, well-appearing, obese woman sitting in chair, in no acute distress HENT: normocephalic atraumatic, mucous  membranes moist Eyes: conjunctiva non-erythematous Neck: supple Pulmonary/Chest: normal work of breathing on room air Abdominal: soft, non-tender, non-distended MSK: normal bulk and tone Neurological: alert & oriented x 3 Psych: Mood is "good"    Assessment & Plan:   See Encounters Tab for problem based charting.  Patient seen with Dr. Mikey Bussing

## 2020-06-16 NOTE — Assessment & Plan Note (Signed)
Patient reports she is stable on her dose of methadone (Dolophine) 39 mg daily. She follows with [facility]. Attends weekly AA meetings and is in frequent touch with her sponsor.  - Continue dolophine 39 mg daily per [employer]

## 2020-06-16 NOTE — Assessment & Plan Note (Addendum)
Without prompting, patient states she would be interested in weight management counseling. Had other priorities for today's visit.   - A1c at next visit - Lipid panel at next visit - Possible referral to [facility] depending on results

## 2020-06-16 NOTE — Assessment & Plan Note (Signed)
Did not discuss patient's smoking history today.   - Address at follow-up

## 2020-06-16 NOTE — Assessment & Plan Note (Signed)
-   colon cancer screening: patient has never had a colonoscopy or Fit testing. Referral to colonoscopy placed today. - cervical cancer screening: Reports history of abnormal pap smear. Release for previous records signed today. Referral to gynecology placed today. - breast cancer screening: Referral to mammogram placed today - flu shot: patient declines, stating she has an older family member who got the flu sometime after getting the flu shot and ended up passing away. Reassured patient of flu shot's safety, however emphasis on COVID-19 vaccination today. - COVID-19 vaccination: had a discussion regarding the risks/benefits of COVID-19 vaccination. Patient hesitant at first, states she will go get her vaccine. Provided with [facility] hotline number.

## 2020-06-16 NOTE — Assessment & Plan Note (Addendum)
Patient reports for the last 2 years she has had increasingly irregular and heavy menstrual periods. Reports a history of abnormal pap smear, unknown result. She states her periods occur sometimes monthly and sometimes every 3 months, whereas she used to have regular cycles. Also, she bleeds for 7 days vs 3, and she changes 10 ultra tampons daily during her menstrual cycle.   She reports a history of iron deficiency anemia for which she has taken oral iron 325 mg BID. CBC obtained in urgent care visit on 03/10/20 with hemoglobin stable at 10.3 with MCV 76.6.  She also reports intermittent hot flashes, which she states occur more often during the months when she does not have her cycle.   A/P: The patient's recent history of heavy menstrual bleeding warrants referral to gynecology, especially given her reported history of abnormal pap smears. Suspect the patient is perimenopausal.  - Referral to gynecology - Patient signed waiver today to release records from her previous providers in [place] - Continue oral iron supplementation 325 mg BID

## 2020-06-16 NOTE — Patient Instructions (Addendum)
Paula Leon,   Thank you for your visit to the [facility] today. It was a pleasure meeting you. Today we discussed the following:  1) Heavy menstrual bleeding - I have placed a referral to gynecology. Someone should call you to schedule an appointment - Continue taking your iron supplement  2) Anxiety - Since you have been doing so well, and you have such great support in the area, you may decrease your paroxetine from 20 mg to 10 mg daily  3) Health maintenance - I have sent a referral for breast cancer screening (mammogram) - I have sent a referral for colon cancer screening (colonoscopy) - COVID-19 Vaccine: We highly recommend getting vaccinated against COVID-19.  - The vaccines are SAFE.They went through all the required stages of clinical trials, and extensive testing and monitoring have shown that these vaccines are safe and effective. - The vaccines are EFFECTIVE. They lower your chances of getting and spreading the virus. They are proven to reduce your chance of severe illness, hospitalization, and death from COVID-19. - Getting vaccinated protects you, your loved ones, and people at risk for severe illness from COVID-19. - You can get vaccinated at any major pharmacy. If you are interested in setting up an appointment to get vaccinated through [facility], you can call the [facility] COVID-19 hotline at [phone].  4) We had you sign a release form so that we can review your previous records.   We would like to see you back in 3-4 weeks to review your past medical records. Please bring all of your medications with you.   If you have any questions or concerns, please call our clinic at [phone] between 9am-5pm. Outside of these hours, call [phone] and ask for the internal medicine resident on call. If you feel you are having a medical emergency please call 911.

## 2020-06-16 NOTE — Assessment & Plan Note (Addendum)
Patient reports a long history of anxiety/panic attacks and depression. States she was prescribed paroxetine 6 years ago while still living in [place]. Reports she had a period recently of having run out of her paroxetine and initially experienced withdrawal symptoms however later felt as if she had improvement in her fatigue. She shares that she has great support in this area, including three of her children, her AA group, and her AA sponsor. She is interested in decreasing her dose of paroxetine to see if her energy level would improve. She states at one point she was on sertraline in the past and tolerated it well and cannot recall why she was switched to paroxetine.  PHQ-9 of 4 today.  A/P: Given her improved mood and significant social support, I am comfortable recommending a decreased dose of the paroxetine.  - Decrease paroxetine from 20 mg daily to 10 mg daily - Advised patient to keep [place] updated if she experiences withdrawal side effects or worsening of her mood

## 2020-06-16 NOTE — Assessment & Plan Note (Signed)
Patient reports a long history of iron deficiency anemia reportedly due to chronic blood loss from menstrual periods. She endorses some fatigue however notes that her energy levels are much improved on the oral iron supplementation.  CBC from 03/10/20 demonstrated hemoglobin stable at 103 with MCV 76.6.   Patient reports she has been taking her PO iron 326 mg BID.

## 2020-06-18 NOTE — Progress Notes (Signed)
Internal Medicine Clinic Attending  I saw and evaluated the patient.  I personally confirmed the key portions of the history and exam documented by Dr. Watson and I reviewed pertinent patient test results.  The assessment, diagnosis, and plan were formulated together and I agree with the documentation in the resident's note.  

## 2020-07-01 ENCOUNTER — Encounter: Payer: Self-pay | Admitting: *Deleted

## 2020-07-03 ENCOUNTER — Ambulatory Visit
Admission: RE | Admit: 2020-07-03 | Discharge: 2020-07-03 | Disposition: A | Discharge status: Home / Self Care | Payer: Medicare Other | Source: Ambulatory Visit | Attending: Internal Medicine | Admitting: Internal Medicine

## 2020-07-03 ENCOUNTER — Other Ambulatory Visit: Payer: Self-pay

## 2020-07-03 DIAGNOSIS — Z1231 Encounter for screening mammogram for malignant neoplasm of breast: Secondary | ICD-10-CM

## 2020-08-04 ENCOUNTER — Encounter: Payer: Self-pay | Admitting: *Deleted
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
## 2020-08-05 ENCOUNTER — Encounter: Payer: Self-pay | Admitting: Internal Medicine

## 2020-08-27 ENCOUNTER — Encounter: Payer: Medicare Other | Admitting: Obstetrics & Gynecology

## 2020-08-28 ENCOUNTER — Other Ambulatory Visit (HOSPITAL_COMMUNITY)
Admission: RE | Admit: 2020-08-28 | Discharge: 2020-08-28 | Disposition: A | Discharge status: Home / Self Care | Payer: Medicare Other | Source: Ambulatory Visit | Attending: Obstetrics & Gynecology | Admitting: Obstetrics & Gynecology

## 2020-08-28 ENCOUNTER — Ambulatory Visit (INDEPENDENT_AMBULATORY_CARE_PROVIDER_SITE_OTHER): Payer: Medicare Other | Admitting: Obstetrics & Gynecology

## 2020-08-28 ENCOUNTER — Other Ambulatory Visit: Payer: Self-pay

## 2020-08-28 ENCOUNTER — Encounter: Payer: Self-pay | Admitting: Obstetrics & Gynecology

## 2020-08-28 VITALS — BP 116/80 | HR 82 | Ht 64.0 in | Wt 249.8 lb

## 2020-08-28 DIAGNOSIS — Z113 Encounter for screening for infections with a predominantly sexual mode of transmission: Secondary | ICD-10-CM | POA: Diagnosis present

## 2020-08-28 DIAGNOSIS — Z01419 Encounter for gynecological examination (general) (routine) without abnormal findings: Secondary | ICD-10-CM | POA: Diagnosis present

## 2020-08-28 DIAGNOSIS — Z87898 Personal history of other specified conditions: Secondary | ICD-10-CM | POA: Diagnosis present

## 2020-08-28 DIAGNOSIS — D500 Iron deficiency anemia secondary to blood loss (chronic): Secondary | ICD-10-CM

## 2020-08-28 DIAGNOSIS — N921 Excessive and frequent menstruation with irregular cycle: Secondary | ICD-10-CM | POA: Diagnosis not present

## 2020-08-28 DIAGNOSIS — F1991 Other psychoactive substance use, unspecified, in remission: Secondary | ICD-10-CM

## 2020-08-28 DIAGNOSIS — F419 Anxiety disorder, unspecified: Secondary | ICD-10-CM

## 2020-08-28 DIAGNOSIS — Z124 Encounter for screening for malignant neoplasm of cervix: Secondary | ICD-10-CM | POA: Diagnosis present

## 2020-08-28 DIAGNOSIS — D649 Anemia, unspecified: Secondary | ICD-10-CM | POA: Insufficient documentation

## 2020-08-28 DIAGNOSIS — Z114 Encounter for screening for human immunodeficiency virus [HIV]: Secondary | ICD-10-CM

## 2020-08-28 DIAGNOSIS — Z1211 Encounter for screening for malignant neoplasm of colon: Secondary | ICD-10-CM

## 2020-08-28 MED ORDER — PAROXETINE HCL 10 MG PO TABS: 10.0000 mg | ORAL_TABLET | Freq: Every evening | ORAL | 4 refills | Status: DC

## 2020-08-28 NOTE — Progress Notes (Signed)
54 y.o. [id number] Single Black or [place] American female here for new patient exam.  Pt moved here from [place].  Cycles have been irregular for last four years.  She can skip cycles.  Doesn't typically have more than one bleeding episode in a month.  Flow lasts 2-5 days.  When bleeding, has to change tampons 7-8 times daily.  She wears a pad with tampons to protect her clothes.    She has anemia with hb 10.3 since June.  Has not had follow up.  History of blood transfusion around 2018 due hb of 4.0  Patient's last menstrual period was 08/17/2020 (approximate).          Sexually active: No The current method of family planning is abstinence     Exercising: Yes.    walk Smoker:  yes  Health Maintenance: Pap:  2 years ago in [place] History of abnormal Pap:  H/o abnormal pap, h/o colposcopy several times MMG:  04/2020 Colonoscopy:  never TDaP:  08/2019 Hep C testing: will obtain today   reports that she has been smoking cigarettes. She has been smoking about 0.25 packs per day. She has never used smokeless tobacco. She reports that she does not drink alcohol and does not use drugs.  Past Medical History:  Diagnosis Date  . Anemia   . Anxiety   . Depression   . Hernia of abdominal wall   . History of drug abuse (HCC)    has not used in ten years    Past Surgical History:  Procedure Laterality Date  . INSERTION OF MESH N/A 04/19/2019   Procedure: INSERTION OF MESH;  Surgeon: Darnell Level, MD;  Location: [facility];  Service: General;  Laterality: N/A;  . TUBAL LIGATION    . VENTRAL HERNIA REPAIR N/A 04/19/2019   Procedure: VENTRAL HERNIA REPAIR;  Surgeon: Darnell Level, MD;  Location: [facility];  Service: General;  Laterality: N/A;    Current Outpatient Medications  Medication Sig Dispense Refill  . ferrous sulfate 325 (65 FE) MG tablet Take 1 tablet (325 mg total) by mouth 2 (two) times daily with a meal. 90 tablet 0  . ibuprofen (ADVIL) 600 MG tablet  Take 1 tablet (600 mg total) by mouth every 8 (eight) hours as needed for moderate pain. 30 tablet 0  . methadone (DOLOPHINE) 10 MG/5ML solution Take 39 mg by mouth daily.     Marland Kitchen PARoxetine (PAXIL) 10 MG tablet Take 1 tablet (10 mg total) by mouth every evening. 61 tablet 0  . cyclobenzaprine (FLEXERIL) 10 MG tablet Take 0.5 tablets (5 mg total) by mouth at bedtime. (Patient not taking: Reported on 08/28/2020) 20 tablet 0   No current facility-administered medications for this visit.    Family History  Problem Relation Age of Onset  . Cancer Mother   . Cancer Father     Review of Systems  Genitourinary: Positive for menstrual problem.  All other systems reviewed and are negative.   Exam:   BP 116/80   Pulse 82   Ht 5\' 4"  (1.626 m)   Wt 249 lb 12.8 oz (113.3 kg)   LMP 08/17/2020 (Approximate)   BMI 42.88 kg/m   Height: 5\' 4"  (162.6 cm)  General appearance: alert, cooperative and appears stated age Head: Normocephalic, without obvious abnormality, atraumatic Neck: no adenopathy, supple, symmetrical, trachea midline and thyroid normal to inspection and palpation Lungs: clear to auscultation bilaterally Breasts: normal appearance, no masses or tenderness Heart: regular rate and  rhythm Abdomen: soft, non-tender; bowel sounds normal; no masses,  no organomegaly Extremities: extremities normal, atraumatic, no cyanosis or edema Skin: Skin color, texture, turgor normal. No rashes or lesions Lymph nodes: Cervical, supraclavicular, and axillary nodes normal. No abnormal inguinal nodes palpated Neurologic: Grossly normal   Pelvic: External genitalia:  no lesions              Urethra:  normal appearing urethra with no masses, tenderness or lesions              Bartholins and Skenes: normal                 Vagina: normal appearing vagina with normal color and discharge, no lesions              Cervix: no lesions              Pap taken: Yes.   Bimanual Exam:  Uterus:  enlarged, 8 weeks  and globular feeling weeks size              Adnexa: no mass, fullness, tenderness               Rectovaginal: Confirms               Anus:  normal sphincter tone, no lesions  Chaperone was present for exam.  1. Menorrhagia with irregular cycle - US PELVIC COMPLETE WITH TRANSVAGINAL; Future - Treatment options with progesterones specifically discussed.  Pt declined starting anything today.   Wants to have evaluation first.  2. Screening for STD (sexually transmitted disease) - Cytology - PAP( [place]) (GC/Chl/trich obtained) - HIV, RPR, Hep B and C obtained as well  3. Cervical cancer screening - Cytology - PAP( [place])  4. Iron deficiency anemia due to chronic blood loss - CBC - Iron - Ferritin  7. Screen for colon cancer - Ambulatory referral to Gastroenterology  8. Anxiety - PARoxetine (PAXIL) 10 MG tablet; Take 1 tablet (10 mg total) by mouth every evening.  Dispense: 90 tablet; Refill: 4

## 2020-08-29 LAB — CBC
Hematocrit: 33 % — ABNORMAL LOW (ref 34.0–46.6)
Hemoglobin: 9.9 g/dL — ABNORMAL LOW (ref 11.1–15.9)
MCH: 22 pg — ABNORMAL LOW (ref 26.6–33.0)
MCHC: 30 g/dL — ABNORMAL LOW (ref 31.5–35.7)
MCV: 73 fL — ABNORMAL LOW (ref 79–97)
Platelets: 253 10*3/uL (ref 150–450)
RBC: 4.51 x10E6/uL (ref 3.77–5.28)
RDW: 16.9 % — ABNORMAL HIGH (ref 11.7–15.4)
WBC: 5.6 10*3/uL (ref 3.4–10.8)

## 2020-08-29 LAB — RPR: RPR Ser Ql: NONREACTIVE

## 2020-08-29 LAB — HEPATITIS C ANTIBODY: Hep C Virus Ab: <0.1 s/co ratio (ref 0.0–0.9)

## 2020-08-29 LAB — HEPATITIS B SURFACE ANTIGEN: Hepatitis B Surface Ag: NEGATIVE

## 2020-08-29 LAB — IRON: Iron: 24 ug/dL — ABNORMAL LOW (ref 27–159)

## 2020-08-29 LAB — FERRITIN: Ferritin: 5 ng/mL — ABNORMAL LOW (ref 15–150)

## 2020-08-29 LAB — HIV ANTIBODY (ROUTINE TESTING W REFLEX): HIV Screen 4th Generation wRfx: NONREACTIVE

## 2020-08-31 LAB — CYTOLOGY - PAP
Chlamydia: NEGATIVE
Comment: NEGATIVE
Comment: NEGATIVE
Comment: NEGATIVE
Comment: NORMAL
Diagnosis: NEGATIVE
High risk HPV: NEGATIVE
Neisseria Gonorrhea: NEGATIVE
Trichomonas: NEGATIVE

## 2020-09-03 ENCOUNTER — Ambulatory Visit: Payer: Medicare Other

## 2020-09-08 ENCOUNTER — Other Ambulatory Visit: Payer: Self-pay

## 2020-09-08 ENCOUNTER — Ambulatory Visit
Admission: RE | Admit: 2020-09-08 | Discharge: 2020-09-08 | Disposition: A | Discharge status: Home / Self Care | Payer: Medicare Other | Source: Ambulatory Visit | Attending: Obstetrics & Gynecology | Admitting: Obstetrics & Gynecology

## 2020-09-08 DIAGNOSIS — N921 Excessive and frequent menstruation with irregular cycle: Secondary | ICD-10-CM | POA: Insufficient documentation

## 2020-09-09 ENCOUNTER — Telehealth: Payer: Self-pay | Admitting: Lactation Services

## 2020-09-09 NOTE — Telephone Encounter (Signed)
Called patient to give results of [place]. She did not answer. LM for patient to call the office for results.

## 2020-09-09 NOTE — Telephone Encounter (Signed)
-----   Message from Jerene Bears, MD sent at 09/09/2020  8:26 AM EST ----- Please let pt know her ultrasound showed uterine fibroids with is the likely cause of her bleeding.  She does need an endometrial biopsy.  We discussed progesterones for initial therapy.  I still think micronor would be a good place to start with treatment.  Ok to send rx if desired.  Also, ok to wait and have biopsy and then discuss treatment.  Thank you.  Please let me know when scheduled.

## 2020-09-10 NOTE — Telephone Encounter (Signed)
Called patient and informed her of [place] results. She was informed the Fibroids may be there cause of her bleeding.   Reviewed an Endometrial Biopsy is recommended/ patient informed that office will ve calling her to schedule.   Reviewed using Progesterone only OCP's for bleeding, offered prescription if patient would like to have today, patient would like to wait until after the Endometrial biopsy to make that decision.   Patient to call back with questions or concerns as needed. Message to front office to call and schedule endometrial biopsy.

## 2020-09-15 ENCOUNTER — Other Ambulatory Visit: Payer: Self-pay | Admitting: Family Medicine

## 2020-09-22 ENCOUNTER — Ambulatory Visit (AMBULATORY_SURGERY_CENTER): Payer: Self-pay

## 2020-09-22 ENCOUNTER — Other Ambulatory Visit: Payer: Self-pay

## 2020-09-22 VITALS — Ht 64.0 in | Wt 248.0 lb

## 2020-09-22 DIAGNOSIS — Z1211 Encounter for screening for malignant neoplasm of colon: Secondary | ICD-10-CM

## 2020-09-22 MED ORDER — NA SULFATE-K SULFATE-MG SULF 17.5-3.13-1.6 GM/177ML PO SOLN: 1.0000 | Freq: Once | ORAL | 0 refills | Status: AC

## 2020-09-22 NOTE — Progress Notes (Signed)

## 2020-10-06 ENCOUNTER — Encounter: Payer: Medicare Other | Admitting: Internal Medicine

## 2020-10-14 ENCOUNTER — Encounter: Payer: Medicare Other | Admitting: Student

## 2020-10-15 ENCOUNTER — Telehealth: Payer: Self-pay | Admitting: *Deleted
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
## 2020-10-19 ENCOUNTER — Ambulatory Visit (AMBULATORY_SURGERY_CENTER): Payer: Medicare Other | Admitting: Gastroenterology

## 2020-10-19 ENCOUNTER — Other Ambulatory Visit: Payer: Self-pay

## 2020-10-19 ENCOUNTER — Encounter: Payer: Self-pay | Admitting: Gastroenterology

## 2020-10-19 VITALS — BP 99/58 | HR 88 | Temp 97.5°F | Resp 21 | Ht 64.0 in | Wt 248.0 lb

## 2020-10-19 DIAGNOSIS — Z1211 Encounter for screening for malignant neoplasm of colon: Secondary | ICD-10-CM | POA: Diagnosis not present

## 2020-10-19 MED ORDER — SODIUM CHLORIDE 0.9 % IV SOLN: 500.0000 mL | Freq: Once | INTRAVENOUS | Status: DC

## 2020-10-19 NOTE — Progress Notes (Signed)
Report to PACU, RN, vss, BBS= Clear.  

## 2020-10-19 NOTE — Progress Notes (Signed)
Medical history reviewed with no changes since P.V. VS assessed by J.K, RN 

## 2020-10-19 NOTE — Op Note (Signed)
[facility] Patient Name: Paula Leon Procedure Date: 10/19/2020 7:56 AM MRN: [id number] Endoscopist: Viviann Spare P. Adela Lank , MD Age: 55 Referring MD:  Date of Birth: Jan 25, 1966 Gender: Female Account #: [id number] Procedure:                Colonoscopy Indications:              Screening for colorectal malignant neoplasm, This                            is the patient's first colonoscopy Medicines:                Monitored Anesthesia Care Procedure:                Pre-Anesthesia Assessment:                           - Prior to the procedure, a History and Physical                            was performed, and patient medications and                            allergies were reviewed. The patient's tolerance of                            previous anesthesia was also reviewed. The risks                            and benefits of the procedure and the sedation                            options and risks were discussed with the patient.                            All questions were answered, and informed consent                            was obtained. Prior Anticoagulants: The patient has                            taken no previous anticoagulant or antiplatelet                            agents. ASA Grade Assessment: III - A patient with                            severe systemic disease. After reviewing the risks                            and benefits, the patient was deemed in                            satisfactory condition to undergo the procedure.  After obtaining informed consent, the colonoscope                            was passed under direct vision. Throughout the                            procedure, the patient's blood pressure, pulse, and                            oxygen saturations were monitored continuously. The                            Olympus PCF-H190DL [id number]) Colonoscope was                            introduced through the  anus and advanced to the the                            cecum, identified by appendiceal orifice and                            ileocecal valve. The colonoscopy was performed                            without difficulty. The patient tolerated the                            procedure well. The quality of the bowel                            preparation was good. The ileocecal valve,                            appendiceal orifice, and rectum were photographed. Scope In: 8:00:58 AM Scope Out: 8:21:44 AM Scope Withdrawal Time: 0 hours 16 minutes 44 seconds  Total Procedure Duration: 0 hours 20 minutes 46 seconds  Findings:                 The perianal and digital rectal examinations were                            normal.                           Scattered small-mouthed diverticula were found in                            the entire colon.                           The exam was otherwise without abnormality. No                            polyps. Complications:            No immediate complications. Estimated blood loss:  None. Estimated Blood Loss:     Estimated blood loss: none. Impression:               - Mild diverticulosis in the entire examined colon.                           - The examination was otherwise normal.                           - No polyps. Recommendation:           - Patient has a contact number available for                            emergencies. The signs and symptoms of potential                            delayed complications were discussed with the                            patient. Return to normal activities tomorrow.                            Written discharge instructions were provided to the                            patient.                           - Resume previous diet.                           - Continue present medications.                           - Repeat colonoscopy in 10 years for screening                             purposes. Viviann Spare P. Adela Lank, MD 10/19/2020 8:25:34 AM This report has been signed electronically.

## 2020-10-19 NOTE — Patient Instructions (Signed)
YOU HAD AN ENDOSCOPIC PROCEDURE TODAY AT THE [facility]:   Refer to the procedure report that was given to you for any specific questions about what was found during the examination.  If the procedure report does not answer your questions, please call your gastroenterologist to clarify.  If you requested that your care partner not be given the details of your procedure findings, then the procedure report has been included in a sealed envelope for you to review at your convenience later.  YOU SHOULD EXPECT: Some feelings of bloating in the abdomen. Passage of more gas than usual.  Walking can help get rid of the air that was put into your GI tract during the procedure and reduce the bloating. If you had a lower endoscopy (such as a colonoscopy or flexible sigmoidoscopy) you may notice spotting of blood in your stool or on the toilet paper. If you underwent a bowel prep for your procedure, you may not have a normal bowel movement for a few days.  Please Note:  You might notice some irritation and congestion in your nose or some drainage.  This is from the oxygen used during your procedure.  There is no need for concern and it should clear up in a day or so.  SYMPTOMS TO REPORT IMMEDIATELY:   Following lower endoscopy (colonoscopy or flexible sigmoidoscopy):  Excessive amounts of blood in the stool  Significant tenderness or worsening of abdominal pains  Swelling of the abdomen that is new, acute  Fever of 100F or higher  For urgent or emergent issues, a gastroenterologist can be reached at any hour by calling [phone]. Do not use MyChart messaging for urgent concerns.    DIET:  We do recommend a small meal at first, but then you may proceed to your regular diet.  Drink plenty of fluids but you should avoid alcoholic beverages for 24 hours.  ACTIVITY:  You should plan to take it easy for the rest of today and you should NOT DRIVE or use heavy machinery until tomorrow (because  of the sedation medicines used during the test).    FOLLOW UP: Our staff will call the number listed on your records 48-72 hours following your procedure to check on you and address any questions or concerns that you may have regarding the information given to you following your procedure. If we do not reach you, we will leave a message.  We will attempt to reach you two times.  During this call, we will ask if you have developed any symptoms of COVID 19. If you develop any symptoms (ie: fever, flu-like symptoms, shortness of breath, cough etc.) before then, please call [phone].  If you test positive for Covid 19 in the 2 weeks post procedure, please call and report this information to us.    If any biopsies were taken you will be contacted by phone or by letter within the next 1-3 weeks.  Please call us at [phone] if you have not heard about the biopsies in 3 weeks.    SIGNATURES/CONFIDENTIALITY: You and/or your care partner have signed paperwork which will be entered into your electronic medical record.  These signatures attest to the fact that that the information above on your After Visit Summary has been reviewed and is understood.  Full responsibility of the confidentiality of this discharge information lies with you and/or your care-partner. 

## 2020-10-20 ENCOUNTER — Telehealth: Payer: Self-pay | Admitting: *Deleted

## 2020-10-20 ENCOUNTER — Encounter: Payer: Medicare Other | Admitting: Internal Medicine

## 2020-10-20 NOTE — Telephone Encounter (Signed)
Call to patient message was left to call to reschedule missed appointment this morning.  Angelina Ok, RN 10/20/2020 10/20/2020 10:52 AM.

## 2020-10-21 ENCOUNTER — Telehealth: Payer: Self-pay

## 2020-10-21 NOTE — Telephone Encounter (Signed)
2nd attempt. Attempted to reach patient for post-procedure f/u call. No answer. Left message for her to please call us if she has any questions/concerns regarding her care.

## 2020-10-21 NOTE — Telephone Encounter (Signed)
Attempted to reach patient for post-procedure f/u call. Voicemail does not allow me to leave a message. Staff will attempt to call her again later today.

## 2020-10-23 ENCOUNTER — Other Ambulatory Visit: Payer: Self-pay

## 2020-10-23 ENCOUNTER — Encounter: Payer: Self-pay | Admitting: Internal Medicine

## 2020-10-23 ENCOUNTER — Ambulatory Visit: Payer: Medicare Other | Admitting: Obstetrics & Gynecology

## 2020-10-23 ENCOUNTER — Ambulatory Visit (INDEPENDENT_AMBULATORY_CARE_PROVIDER_SITE_OTHER): Payer: Medicare Other | Admitting: Internal Medicine

## 2020-10-23 VITALS — BP 127/97 | HR 92 | Temp 98.4°F | Ht 64.0 in | Wt 253.8 lb

## 2020-10-23 DIAGNOSIS — M25562 Pain in left knee: Secondary | ICD-10-CM | POA: Diagnosis not present

## 2020-10-23 DIAGNOSIS — G8929 Other chronic pain: Secondary | ICD-10-CM

## 2020-10-23 DIAGNOSIS — M25561 Pain in right knee: Secondary | ICD-10-CM | POA: Diagnosis not present

## 2020-10-23 DIAGNOSIS — F419 Anxiety disorder, unspecified: Secondary | ICD-10-CM | POA: Diagnosis not present

## 2020-10-23 MED ORDER — FERROUS SULFATE 325 (65 FE) MG PO TABS: 325.0000 mg | ORAL_TABLET | Freq: Every day | ORAL | 0 refills | Status: DC

## 2020-10-23 MED ORDER — PAROXETINE HCL 20 MG PO TABS: 20.0000 mg | ORAL_TABLET | Freq: Every evening | ORAL | 0 refills | Status: DC

## 2020-10-23 MED ORDER — MELOXICAM 15 MG PO TABS: 15.0000 mg | ORAL_TABLET | Freq: Every day | ORAL | 2 refills | Status: DC

## 2020-10-23 NOTE — Assessment & Plan Note (Signed)
Paula Leon presents with bilateral knee pain that has worsened over the last month. No injury at time of onset. Symptoms are worse with climbing stairs and prolonged standing. She endorses they will swell after she is on her feet for a while. She has been taking Ibuprofen intermittently with minimal relief.   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 She states she is highly motivated to get her weight down. Can continue discussing options with PCP including GLP-1, nutritionist referral, etc.  -will obtain plain films of both knees to evaluate for degenerative joint changes or other abnormalities -start meloxicam 15 mg daily, and supplement with Tylenol 650 mg BID -advised her to use compression knee sleeve/brace while working -have also provided a note for work stating she would benefit from periodic breaks to elevate her legs to minimize pain and swelling

## 2020-10-23 NOTE — Patient Instructions (Signed)
Ms. Plantz, Today we talked about your knee pain which is most likely from wear and tear on the joints over time. Weight is a big contributor to this so I'm glad you are motivated to try to achieve some weight loss. We can continue to help you with this at future visits.   - I'm getting x-rays today and will let you know when I have these results - I'd like you to start taking meloxicam once daily. Do not take Ibuprofen along with this - you can take Tylenol 650 mg twice daily to help with pain as well - I have written a letter about trying to take breaks at work - I also recommend a compression brace/or sleeve to wear while at work.    Take care, Dr. Chesley Mires

## 2020-10-23 NOTE — Progress Notes (Signed)
Acute Office Visit  Subjective:    Patient ID: Paula Leon, female    DOB: Jan 02, 1966, 55 y.o.   MRN: [id number]  Chief Complaint  Patient presents with  . Follow-up    Med to discuss, knee pain   . Knee Pain    Aching in hand tightness goes after voiding .  Throbbing, aching , tingling in hands     HPI Patient is in today for bilateral knee pain, L>R for the last month. Please see problem based charting for further details.   Past Medical History:  Diagnosis Date  . Anemia    IDA  . Anxiety   . Depression   . Hernia of abdominal wall   . History of drug abuse (HCC)    has not used in ten years  . 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 Transfusion history    due to anemia with hb 4.0    Past Surgical History:  Procedure Laterality Date  . INSERTION OF MESH N/A 04/19/2019   Procedure: INSERTION OF MESH;  Surgeon: Darnell Level, MD;  Location: [facility];  Service: General;  Laterality: N/A;  . SALPINGECTOMY Left    due to ruptured ectopic  . VENTRAL HERNIA REPAIR N/A 04/19/2019   Procedure: VENTRAL HERNIA REPAIR;  Surgeon: Darnell Level, MD;  Location: [facility];  Service: General;  Laterality: N/A;    Family History  Problem Relation Age of Onset  . Cancer Mother   . Cancer Father   . Colon cancer Neg Hx   . Colon polyps Neg Hx   . Esophageal cancer Neg Hx   . Rectal cancer Neg Hx   . Stomach cancer Neg Hx     Social History   Socioeconomic History  . Marital status: Single    Spouse name: Not on file  . Number of children: Not on file  . Years of education: Not on file  . Highest education level: Not on file  Occupational History  . Not on file  Tobacco Use  . Smoking status: Current Every Day Smoker    Packs/day: 0.25    Types: Cigarettes  . Smokeless tobacco: Never Used  . Tobacco comment:  4 cigarettes per day  Vaping Use  . Vaping Use: Never used  Substance and Sexual Activity  . Alcohol use: Not Currently     Comment: 2012  . Drug use: Yes    Types: Heroin, Marijuana    Comment: last time used drugs was 2012  . Sexual activity: Not Currently    Birth control/protection: Surgical  Other Topics Concern  . Not on file  Social History Narrative  . Not on file   Social Determinants of Health   Financial Resource Strain: Not on file  Food Insecurity: Not on file  Transportation Needs: Not on file  Physical Activity: Not on file  Stress: Not on file  Social Connections: Not on file  Intimate Partner Violence: Not on file    Outpatient Medications Prior to Visit  Medication Sig Dispense Refill  . methadone (DOLOPHINE) 10 MG/5ML solution Take 39 mg by mouth daily.     . ferrous sulfate 325 (65 FE) MG tablet Take 1 tablet (325 mg total) by mouth 2 (two) times daily with a meal. 90 tablet 0  . ibuprofen (ADVIL) 600 MG tablet Take 1 tablet (600 mg total) by mouth every 8 (eight) hours as needed for moderate pain. (Patient not taking: Reported  on 10/19/2020) 30 tablet 0  . PARoxetine (PAXIL) 10 MG tablet Take 1 tablet (10 mg total) by mouth every evening. 90 tablet 4   No facility-administered medications prior to visit.    Allergies  Allergen Reactions  . Penicillins Swelling    Review of Systems  Constitutional: Negative for chills, fatigue, fever and unexpected weight change.  Musculoskeletal: Positive for gait problem and joint swelling. Negative for back pain.  Skin: Negative for color change and rash.  Neurological: Negative for weakness and numbness.       Objective:    Physical Exam Constitutional:      General: She is not in acute distress.    Appearance: Normal appearance.  Musculoskeletal:     Right knee: Crepitus present. No effusion. Normal range of motion.     Instability Tests: Anterior drawer test negative. Posterior drawer test negative. Medial McMurray test negative and lateral McMurray test negative.     Left knee: Crepitus present. No effusion. Normal range  of motion. Tenderness present over the medial joint line.     Instability Tests: Anterior drawer test negative. Posterior drawer test negative. Medial McMurray test negative and lateral McMurray test negative.  Neurological:     Mental Status: She is alert.     BP (!) 127/97 (BP Location: Left Arm, Patient Position: Sitting, Cuff Size: Large)   Pulse 92   Temp 98.4 F (36.9 C) (Oral)   Ht 5\' 4"  (1.626 m)   Wt 253 lb 12.8 oz (115.1 kg)   LMP 08/26/2020   SpO2 100% Comment: room air  BMI 43.56 kg/m  Wt Readings from Last 3 Encounters:  10/23/20 253 lb 12.8 oz (115.1 kg)  10/19/20 248 lb (112.5 kg)  09/22/20 248 lb (112.5 kg)    Health Maintenance Due  Topic Date Due  . INFLUENZA VACCINE  Never done  . COVID-19 Vaccine (3 - Booster for Moderna series) 07/23/2020    There are no preventive care reminders to display for this patient.   No results found for: TSH Lab Results  Component Value Date   WBC 5.6 08/28/2020   HGB 9.9 (L) 08/28/2020   HCT 33.0 (L) 08/28/2020   MCV 73 (L) 08/28/2020   PLT 253 08/28/2020   Lab Results  Component Value Date   NA 136 07/28/2019   K 4.0 07/28/2019   CO2 23 07/28/2019   GLUCOSE 85 07/28/2019   BUN 14 07/28/2019   CREATININE 0.83 07/28/2019   BILITOT 0.4 07/28/2019   ALKPHOS 85 07/28/2019   AST 21 07/28/2019   ALT 16 07/28/2019   PROT 6.7 07/28/2019   ALBUMIN 3.5 07/28/2019   CALCIUM 8.7 (L) 07/28/2019   ANIONGAP 10 07/28/2019   No results found for: CHOL No results found for: HDL No results found for: LDLCALC No results found for: TRIG No results found for: CHOLHDL No results found for: 13/09/2018     Assessment & Plan:   Problem List Items Addressed This Visit      Other   Chronic pain of both knees - Primary    [name] presents with bilateral knee pain that has worsened over the last month. No injury at time of onset. Symptoms are worse with climbing stairs and prolonged standing. She endorses they will swell after  she is on her feet for a while. She has been taking Ibuprofen intermittently with minimal relief.   A/P I discussed with [name] that her symptoms are likely due to primary OA and  the most beneficial thing to help with her symptoms long-term is weight loss. She states she is highly motivated to get her weight down. Can continue discussing options with PCP including GLP-1, nutritionist referral, etc.  -will obtain plain films of both knees to evaluate for degenerative joint changes or other abnormalities -start meloxicam 15 mg daily, and supplement with Tylenol 650 mg BID -advised her to use compression knee sleeve/brace while working -have also provided a note for work stating she would benefit from periodic breaks to elevate her legs to minimize pain and swelling       Relevant Medications   meloxicam (MOBIC) 15 MG tablet   PARoxetine (PAXIL) 20 MG tablet   Other Relevant Orders   DG Knee Complete 4 Views Left   DG Knee Complete 4 Views Right    Other Visit Diagnoses    Anxiety       Relevant Medications   PARoxetine (PAXIL) 20 MG tablet       Meds ordered this encounter  Medications  . ferrous sulfate 325 (65 FE) MG tablet    Sig: Take 1 tablet (325 mg total) by mouth daily with breakfast.    Dispense:  90 tablet    Refill:  0  . meloxicam (MOBIC) 15 MG tablet    Sig: Take 1 tablet (15 mg total) by mouth daily.    Dispense:  30 tablet    Refill:  2  . PARoxetine (PAXIL) 20 MG tablet    Sig: Take 1 tablet (20 mg total) by mouth every evening.    Dispense:  90 tablet    Refill:  0     Akshar Starnes D Savanah Bayles, DO

## 2020-10-26 ENCOUNTER — Telehealth: Payer: Self-pay | Admitting: Gastroenterology

## 2020-10-26 NOTE — Telephone Encounter (Signed)
Inbound call from patient stating she has been experiencing constipation since her procedure on 10/19/2020 and wants to know if this is normal  Please advise.

## 2020-10-26 NOTE — Telephone Encounter (Signed)
Spoke with patient, she states that she has bloating and constipation, last BM was 4 days ago, pt states that she tried to drink smoothies but still has not had a BM, reports sharp epigastric pain, pt states that she is not passing gas either. She denied trying any OTC medications like stool softeners or Gas-X. Please advise, thank you.

## 2020-10-26 NOTE — Telephone Encounter (Signed)
You can tell her she may not have a bowel movement for a few days post colonoscopy but this seems longer than usual. She may want to try a fleet enema OTC to open things up from below and then use some Miralax - 2 doses once and then daily thereafter, can titrate as needed. If she has vomiting / intolerance of PO, worsening in general, etc, she would need to call us back. Thanks

## 2020-10-27 NOTE — Progress Notes (Signed)
Internal Medicine Clinic Attending  Case discussed with Dr. Bloomfield  At the time of the visit.  We reviewed the resident's history and exam and pertinent patient test results.  I agree with the assessment, diagnosis, and plan of care documented in the resident's note.  

## 2020-10-27 NOTE — Telephone Encounter (Signed)
Spoke with patient in regards to Dr. Lanetta Inch recommendations. Advised patient to get a fleet enema OTC and to use some Miralax, 2 doses once and then daily thereafter, she is aware that she can titrate as needed. Patient is aware that she will need to let us know if she has vomiting, intolerance of PO or worsening in general. Patient states that she took a dulcolax this morning was able to have a bowel movement but still does not feel complete relief, re-discussed Dr. Lanetta Inch recommendations. Patient verbalized understanding of all information and had no concerns at the end of the call.

## 2020-10-29 ENCOUNTER — Telehealth: Payer: Self-pay | Admitting: Obstetrics & Gynecology

## 2020-10-29 NOTE — Telephone Encounter (Signed)
LVM for pt to call back to resch prodcure appt

## 2020-11-11 ENCOUNTER — Ambulatory Visit (HOSPITAL_COMMUNITY)
Admission: RE | Admit: 2020-11-11 | Discharge: 2020-11-11 | Disposition: A | Discharge status: Home / Self Care | Payer: Medicare Other | Source: Ambulatory Visit | Attending: Internal Medicine | Admitting: Internal Medicine

## 2020-11-11 ENCOUNTER — Other Ambulatory Visit: Payer: Self-pay

## 2020-11-11 DIAGNOSIS — G8929 Other chronic pain: Secondary | ICD-10-CM | POA: Insufficient documentation

## 2020-11-11 DIAGNOSIS — M25561 Pain in right knee: Secondary | ICD-10-CM | POA: Diagnosis not present

## 2020-11-11 DIAGNOSIS — M25562 Pain in left knee: Secondary | ICD-10-CM | POA: Insufficient documentation

## 2020-11-23 ENCOUNTER — Encounter: Payer: Self-pay | Admitting: Obstetrics & Gynecology

## 2020-11-23 ENCOUNTER — Ambulatory Visit: Payer: Medicare Other | Admitting: Obstetrics and Gynecology

## 2020-11-23 ENCOUNTER — Other Ambulatory Visit: Payer: Self-pay

## 2020-11-23 ENCOUNTER — Encounter: Payer: Self-pay | Admitting: *Deleted

## 2020-11-23 ENCOUNTER — Other Ambulatory Visit (HOSPITAL_COMMUNITY)
Admission: RE | Admit: 2020-11-23 | Discharge: 2020-11-23 | Disposition: A | Discharge status: Home / Self Care | Payer: Medicare Other | Source: Ambulatory Visit | Attending: Obstetrics & Gynecology | Admitting: Obstetrics & Gynecology

## 2020-11-23 ENCOUNTER — Ambulatory Visit (INDEPENDENT_AMBULATORY_CARE_PROVIDER_SITE_OTHER): Payer: Medicare Other | Admitting: Obstetrics & Gynecology

## 2020-11-23 VITALS — BP 127/78 | HR 93 | Wt 249.4 lb

## 2020-11-23 DIAGNOSIS — N939 Abnormal uterine and vaginal bleeding, unspecified: Secondary | ICD-10-CM

## 2020-11-23 LAB — POCT PREGNANCY, URINE: Preg Test, Ur: NEGATIVE

## 2020-11-23 MED ORDER — MEDROXYPROGESTERONE ACETATE 10 MG PO TABS: ORAL_TABLET | ORAL | 2 refills | Status: DC

## 2020-11-23 NOTE — Progress Notes (Signed)
Patient stated she is ready to stop smoking cigarettes and wants to speak to a doctor

## 2020-11-23 NOTE — Progress Notes (Signed)
Patient ID: Paula Leon, female   DOB: 18-Jun-1966, 55 y.o.   MRN: [id number]  Chief Complaint  Patient presents with  . gyn visit    HPI Paula Leon is a 55 y.o. female. Patient's last menstrual period was 09/30/2020 (exact date).  [id number] She was seen in December by Dr. Hyacinth Meeker with the following presentation   Pt moved here from [facility].  Cycles have been irregular for last four years.  She can skip cycles.  Doesn't typically have more than one bleeding episode in a month.  Flow lasts 2-5 days.  When bleeding, has to change tampons 7-8 times daily.  She wears a pad with tampons to protect her clothes.  Patient returns today for endometrial biopsy. No bleeding since her last period. HPI  Past Medical History:  Diagnosis Date  . Anemia    IDA  . Anxiety   . Depression   . Hernia of abdominal wall   . History of drug abuse (HCC)    has not used in ten years  . 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 Transfusion history    due to anemia with hb 4.0    Past Surgical History:  Procedure Laterality Date  . INSERTION OF MESH N/A 04/19/2019   Procedure: INSERTION OF MESH;  Surgeon: Darnell Level, MD;  Location: [facility];  Service: General;  Laterality: N/A;  . SALPINGECTOMY Left    due to ruptured ectopic  . VENTRAL HERNIA REPAIR N/A 04/19/2019   Procedure: VENTRAL HERNIA REPAIR;  Surgeon: Darnell Level, MD;  Location: [facility];  Service: General;  Laterality: N/A;    Family History  Problem Relation Age of Onset  . Cancer Mother   . Cancer Father   . Colon cancer Neg Hx   . Colon polyps Neg Hx   . Esophageal cancer Neg Hx   . Rectal cancer Neg Hx   . Stomach cancer Neg Hx     Social History Social History   Tobacco Use  . Smoking status: Current Every Day Smoker    Packs/day: 0.25    Types: Cigarettes  . Smokeless tobacco: Never Used  . Tobacco comment:  4 cigarettes per day  Vaping Use  . Vaping Use: Never used  Substance Use  Topics  . Alcohol use: Not Currently    Comment: 2012  . Drug use: Not Currently    Types: Heroin, Marijuana    Comment: last time used drugs was 2012    Allergies  Allergen Reactions  . Penicillins Swelling    Current Outpatient Medications  Medication Sig Dispense Refill  . ferrous sulfate 325 (65 FE) MG tablet Take 1 tablet (325 mg total) by mouth daily with breakfast. 90 tablet 0  . medroxyPROGESTERone (PROVERA) 10 MG tablet 10 mg PO daily for days 1-10 each month 30 tablet 2  . meloxicam (MOBIC) 15 MG tablet Take 1 tablet (15 mg total) by mouth daily. 30 tablet 2  . methadone (DOLOPHINE) 10 MG/5ML solution Take 39 mg by mouth daily.     Marland Kitchen PARoxetine (PAXIL) 20 MG tablet Take 1 tablet (20 mg total) by mouth every evening. 90 tablet 0   No current facility-administered medications for this visit.    Review of Systems Review of Systems  Endocrine:       Occasional VMS which have been decreasing    Blood pressure 127/78, pulse 93, weight 249 lb 6.4 oz (113.1 kg), last menstrual period 09/30/2020.  Physical Exam Physical Exam Exam conducted with a chaperone present.  Constitutional:      Appearance: She is obese. She is not ill-appearing.  Pulmonary:     Effort: Pulmonary effort is normal.  Genitourinary:    General: Normal vulva.     Exam position: Lithotomy position.     Vagina: Normal.     Cervix: Normal.     Uterus: Normal.      Adnexa: Right adnexa normal and left adnexa normal.  Psychiatric:        Mood and Affect: Mood normal.        Behavior: Behavior normal.    Patient given informed consent, signed copy in the chart, time out was performed. Appropriate time out taken. . The patient was placed in the lithotomy position and the cervix brought into view with sterile speculum.  Portio of cervix cleansed x 2 with betadine swabs.  A tenaculum was placed in the anterior lip of the cervix.  The uterus was sounded for depth of 9 cm. A pipelle was introduced to into  the uterus, suction created,  and an endometrial sample was obtained. All equipment was removed and accounted for.  The patient tolerated the procedure well.    Data Reviewed CBC    Component Value Date/Time   WBC 5.6 08/28/2020 1114   WBC 4.4 03/10/2020 1214   RBC 4.51 08/28/2020 1114   RBC 4.44 03/10/2020 1214   HGB 9.9 (L) 08/28/2020 1114   HCT 33.0 (L) 08/28/2020 1114   PLT 253 08/28/2020 1114   MCV 73 (L) 08/28/2020 1114   MCH 22.0 (L) 08/28/2020 1114   MCH 23.2 (L) 03/10/2020 1214   MCHC 30.0 (L) 08/28/2020 1114   MCHC 30.3 03/10/2020 1214   RDW 16.9 (H) 08/28/2020 1114   LYMPHSABS 1.7 02/23/2019 1230   MONOABS 0.4 02/23/2019 1230   EOSABS 0.1 02/23/2019 1230   BASOSABS 0.0 02/23/2019 1230   CLINICAL DATA:  Menorrhagia with heavy irregular cycles, abnormal uterine bleeding, LMP 08/17/2020  EXAM: TRANSABDOMINAL AND TRANSVAGINAL ULTRASOUND OF PELVIS  TECHNIQUE: Both transabdominal and transvaginal ultrasound examinations of the pelvis were performed. Transabdominal technique was performed for global imaging of the pelvis including uterus, ovaries, adnexal regions, and pelvic cul-de-sac. It was necessary to proceed with endovaginal exam following the transabdominal exam to visualize the uterus, endometrium, and ovaries.  COMPARISON:  None  FINDINGS: Uterus  Measurements: 8.7 x 4.9 x 5.1 cm = volume: 110 mL. Anteverted. Heterogeneous myometrium. LEFT fundal leiomyoma anteriorly 3.0 x 2.5 x 2.9 cm. Additional tiny intramural leiomyoma at anterior uterus 10 x 7 x 12 mm.  Endometrium  Thickness: 5 mm.  Trace endometrial fluid.  No focal mass.  Right ovary  Measurements: 2.5 x 1.8 x 2.1 cm = volume: 4.8 mL. Normal morphology without mass  Left ovary  Not visualized, likely obscured by bowel  Other findings  Trace free pelvic fluid.  No adnexal masses.  IMPRESSION: Nonvisualization of LEFT ovary.  Two small uterine leiomyomata, largest  3.0 cm diameter.  Unremarkable endometrial complex and RIGHT ovary.   Electronically Signed   By: Ulyses Southward M.D.   On: 09/08/2020 12:12  Assessment Perimenopausal DUB   Plan F/u Bx result, RTC to review her progress Meds ordered this encounter  Medications  . medroxyPROGESTERone (PROVERA) 10 MG tablet    Sig: 10 mg PO daily for days 1-10 each month    Dispense:  30 tablet    Refill:  2   Cyclic provera  and RTC 4 months Orders Placed This Encounter  Procedures  . FSH  . Pregnancy, urine POC       Scheryl Darter 11/23/2020, 1:27 PM

## 2020-11-23 NOTE — Patient Instructions (Signed)
Menopause and Hormone Replacement Therapy Menopause is a normal time of life when menstrual periods stop completely and the ovaries stop producing the female hormones estrogen and progesterone. Low levels of these hormones can affect your health and cause symptoms. Hormone replacement therapy (HRT) can relieve some of those symptoms. HRT is the use of artificial (synthetic) hormones to replace hormones that your body has stopped producing because you have reached menopause. Types of HRT HRT may consist of the synthetic hormones estrogen and progestin, or it may consist of estrogen-only therapy. You and your health care provider will decide which form of HRT is best for you. If you choose to be on HRT and you have a uterus, estrogen and progestin are usually prescribed. Estrogen-only therapy is used for women who do not have a uterus. Possible options for taking HRT include:  Pills.  Patches.  Gels.  Sprays.  Vaginal cream.  Vaginal rings.  Vaginal inserts. The amount of hormones that you take and how long you take them varies according to your health. It is important to:  Begin HRT with the lowest possible dosage.  Stop HRT as soon as your health care provider tells you to stop.  Work with your health care provider so that you feel informed and comfortable with your decisions.   Tell a health care provider about:  Any allergies you have.  Whether you have had blood clots or know of any risk factors you may have for blood clots.  Whether you or family members have had cancer, especially cancer of the breasts, ovaries, or uterus.  Any surgeries you have had.  All medicines you are taking, including vitamins, herbs, eye drops, creams, and over-the-counter medicines.  Whether you are pregnant or may be pregnant.  Any medical conditions you have. What are the benefits? HRT can reduce the frequency and severity of menopausal symptoms. Benefits of HRT vary according to the kind  of symptoms that you have, how severe they are, and your overall health. HRT may help to improve the following symptoms of menopause:  Hot flashes and night sweats. These are sudden feelings of heat that spread over the face and body. The skin may turn red, like a blush. Night sweats are hot flashes that happen while you are sleeping or trying to sleep.  Bone loss (osteoporosis). The body loses calcium more quickly after menopause, causing the bones to become weaker. This can increase the risk for bone breaks (fractures).  Vaginal dryness. The lining of the vagina can become thin and dry, which can cause pain during sex or cause infection, burning, or itching.  Urinary tract infections.  Urinary incontinence. This is the inability to control when you urinate.  Irritability.  Short-term memory problems. What are the risks? Risks of HRT vary depending on your individual health and medical history. Risks of HRT also depend on whether you receive both estrogen and progestin or you receive estrogen only. HRT may increase the risk of:  Spotting. This is when a small amount of blood leaks from the vagina unexpectedly.  Endometrial cancer. This cancer is in the lining of the uterus (endometrium).  Breast cancer.  Increased density of breast tissue. This can make it harder to find breast cancer on a breast X-ray (mammogram).  Stroke.  Heart disease.  Blood clots.  Gallbladder disease or liver disease. Risks of HRT can increase if you have any of the following conditions:  Endometrial cancer.  Liver disease.  Heart disease.  Breast cancer.    History of blood clots.  History of stroke. Follow these instructions at home: Pap tests  Have Pap tests done as often as told by your health care provider. A Pap test is sometimes called a Pap smear. It is a screening test that is used to check for signs of cancer of the cervix and vagina. A Pap test can also identify the presence of  infection or precancerous changes. Pap tests may be done: ? Every 3 years, starting at age 21. ? Every 5 years, starting after age 30, in combination with testing for human papillomavirus (HPV). ? More often or less often depending on other medical conditions you have, your age, and other risk factors.  It is up to you to get the results of your Pap test. Ask your health care provider, or the department that is doing the test, when your results will be ready. General instructions  Take over-the-counter and prescription medicines only as told by your health care provider.  Do not use any products that contain nicotine or tobacco. These products include cigarettes, chewing tobacco, and vaping devices, such as e-cigarettes. If you need help quitting, ask your health care provider.  Get mammograms, pelvic exams, and medical checkups as often as told by your health care provider.  Keep all follow-up visits. This is important. Contact a health care provider if you have:  Pain or swelling in your legs.  Lumps or changes in your breasts or armpits.  Pain, burning, or bleeding when you urinate.  Unusual vaginal bleeding.  Dizziness or headaches.  Pain in your abdomen. Get help right away if you have:  Shortness of breath.  Chest pain.  Slurred speech.  Weakness or numbness in any part of your arms or legs. These symptoms may represent a serious problem that is an emergency. Do not wait to see if the symptoms will go away. Get medical help right away. Call your local emergency services (911 in the [place] Do not drive yourself to the hospital. Summary  Menopause is a normal time of life when menstrual periods stop completely and the ovaries stop producing the female hormones estrogen and progesterone.  HRT can reduce the frequency and severity of menopausal symptoms.  Risks of HRT vary depending on your individual health and medical history. This information is not intended to  replace advice given to you by your health care provider. Make sure you discuss any questions you have with your health care provider. Document Revised: 03/16/2020 Document Reviewed: 03/16/2020 Elsevier Patient Education  2021 [employer]  

## 2020-11-24 LAB — FOLLICLE STIMULATING HORMONE: FSH: 47.8 m[IU]/mL

## 2020-11-25 LAB — SURGICAL PATHOLOGY

## 2021-01-24 ENCOUNTER — Ambulatory Visit (HOSPITAL_COMMUNITY)
Admission: EM | Admit: 2021-01-24 | Discharge: 2021-01-24 | Disposition: A | Discharge status: Home / Self Care | Payer: Medicare Other | Attending: Urgent Care | Admitting: Urgent Care

## 2021-01-24 ENCOUNTER — Encounter (HOSPITAL_COMMUNITY): Payer: Self-pay

## 2021-01-24 ENCOUNTER — Other Ambulatory Visit: Payer: Self-pay

## 2021-01-24 DIAGNOSIS — R300 Dysuria: Secondary | ICD-10-CM | POA: Diagnosis present

## 2021-01-24 DIAGNOSIS — N3001 Acute cystitis with hematuria: Secondary | ICD-10-CM | POA: Diagnosis present

## 2021-01-24 DIAGNOSIS — B9689 Other specified bacterial agents as the cause of diseases classified elsewhere: Secondary | ICD-10-CM | POA: Diagnosis not present

## 2021-01-24 LAB — POCT URINALYSIS DIPSTICK, ED / UC
Bilirubin Urine: NEGATIVE
Glucose, UA: NEGATIVE mg/dL
Ketones, ur: NEGATIVE mg/dL
Nitrite: NEGATIVE
Protein, ur: 100 mg/dL — AB
Specific Gravity, Urine: <=1.005 (ref 1.005–1.030)
Urobilinogen, UA: 1 mg/dL (ref 0.0–1.0)
pH: 6 (ref 5.0–8.0)

## 2021-01-24 MED ORDER — SULFAMETHOXAZOLE-TRIMETHOPRIM 800-160 MG PO TABS: 1.0000 | ORAL_TABLET | Freq: Two times a day (BID) | ORAL | 0 refills | Status: DC

## 2021-01-24 MED ORDER — PHENAZOPYRIDINE HCL 200 MG PO TABS: 200.0000 mg | ORAL_TABLET | Freq: Three times a day (TID) | ORAL | 0 refills | Status: DC | PRN

## 2021-01-24 NOTE — Discharge Instructions (Signed)

## 2021-01-24 NOTE — ED Provider Notes (Signed)
[facility]   MRN: [id number] DOB: 12/01/1965  Subjective:   Paula Leon is a 55 y.o. female presenting for 2-day history of acute onset dysuria, urinary frequency, urinary urgency, lower abdominal pain, intermittent nausea.  Patient is sexually active with 1 female partner, would like to make sure that she does not have an STI.  Denies fever, vomiting, flank pain, vaginal discharge, hematuria.  Patient hydrates very well with plain water.  She does have a history of uterine fibroids, abnormal uterine bleeding.  No current facility-administered medications for this encounter.  Current Outpatient Medications:  .  ferrous sulfate 325 (65 FE) MG tablet, Take 1 tablet (325 mg total) by mouth daily with breakfast., Disp: 90 tablet, Rfl: 0 .  medroxyPROGESTERone (PROVERA) 10 MG tablet, 10 mg PO daily for days 1-10 each month, Disp: 30 tablet, Rfl: 2 .  meloxicam (MOBIC) 15 MG tablet, Take 1 tablet (15 mg total) by mouth daily., Disp: 30 tablet, Rfl: 2 .  methadone (DOLOPHINE) 10 MG/5ML solution, Take 39 mg by mouth daily. , Disp: , Rfl:  .  PARoxetine (PAXIL) 20 MG tablet, Take 1 tablet (20 mg total) by mouth every evening., Disp: 90 tablet, Rfl: 0   Allergies  Allergen Reactions  . Penicillins Swelling    Past Medical History:  Diagnosis Date  . Anemia    IDA  . Anxiety   . Depression   . Hernia of abdominal wall   . History of drug abuse (HCC)    has not used in ten years  . 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 Transfusion history    due to anemia with hb 4.0     Past Surgical History:  Procedure Laterality Date  . INSERTION OF MESH N/A 04/19/2019   Procedure: INSERTION OF MESH;  Surgeon: Darnell Level, MD;  Location: [facility];  Service: General;  Laterality: N/A;  . SALPINGECTOMY Left    due to ruptured ectopic  . VENTRAL HERNIA REPAIR N/A 04/19/2019   Procedure: VENTRAL HERNIA REPAIR;  Surgeon: Darnell Level, MD;  Location: [facility];  Service: General;  Laterality: N/A;    Family History  Problem Relation Age of Onset  . Cancer Mother   . Cancer Father   . Colon cancer Neg Hx   . Colon polyps Neg Hx   . Esophageal cancer Neg Hx   . Rectal cancer Neg Hx   . Stomach cancer Neg Hx     Social History   Tobacco Use  . Smoking status: Current Every Day Smoker    Packs/day: 0.25    Types: Cigarettes  . Smokeless tobacco: Never Used  . Tobacco comment:  4 cigarettes per day  Vaping Use  . Vaping Use: Never used  Substance Use Topics  . Alcohol use: Not Currently    Comment: 2012  . Drug use: Not Currently    Types: Heroin, Marijuana    Comment: last time used drugs was 2012    ROS   Objective:   Vitals: BP (!) 105/58 (BP Location: Left Arm)   Pulse 100   Temp 99 F (37.2 C) (Oral)   Resp 17   SpO2 97%   Physical Exam Constitutional:      General: She is not in acute distress.    Appearance: Normal appearance. She is well-developed. She is not ill-appearing, toxic-appearing or diaphoretic.  HENT:     Head: Normocephalic and atraumatic.  Nose: Nose normal.     Mouth/Throat:     Mouth: Mucous membranes are moist.     Pharynx: Oropharynx is clear.  Eyes:     General: No scleral icterus.       Right eye: No discharge.        Left eye: No discharge.     Extraocular Movements: Extraocular movements intact.     Conjunctiva/sclera: Conjunctivae normal.     Pupils: Pupils are equal, round, and reactive to light.  Cardiovascular:     Rate and Rhythm: Normal rate.  Pulmonary:     Effort: Pulmonary effort is normal.  Abdominal:     General: Bowel sounds are normal. There is no distension.     Palpations: Abdomen is soft. There is no mass.     Tenderness: There is no abdominal tenderness. There is no right CVA tenderness, left CVA tenderness, guarding or rebound.  Skin:    General: Skin is warm and dry.  Neurological:     General: No focal deficit present.     Mental Status:  She is alert and oriented to person, place, and time.  Psychiatric:        Mood and Affect: Mood normal.        Behavior: Behavior normal.        Thought Content: Thought content normal.        Judgment: Judgment normal.     Results for orders placed or performed during the hospital encounter of 01/24/21 (from the past 24 hour(s))  POC Urinalysis dipstick     Status: Abnormal   Collection Time: 01/24/21 10:46 AM  Result Value Ref Range   Glucose, UA NEGATIVE NEGATIVE mg/dL   Bilirubin Urine NEGATIVE NEGATIVE   Ketones, ur NEGATIVE NEGATIVE mg/dL   Specific Gravity, Urine <=1.005 1.005 - 1.030   Hgb urine dipstick LARGE (A) NEGATIVE   pH 6.0 5.0 - 8.0   Protein, ur 100 (A) NEGATIVE mg/dL   Urobilinogen, UA 1.0 0.0 - 1.0 mg/dL   Nitrite NEGATIVE NEGATIVE   Leukocytes,Ua SMALL (A) NEGATIVE    Assessment and Plan :   PDMP not reviewed this encounter.  1. Acute cystitis with hematuria   2. Dysuria     Start Bactrim to cover for acute cystitis, urine culture pending.  Recommended aggressive hydration, limiting urinary irritants.  STI testing pending as well.  Counseled patient on potential for adverse effects with medications prescribed/recommended today, ER and return-to-clinic precautions discussed, patient verbalized understanding.    Wallis Bamberg, PA-C 01/24/21 1103

## 2021-01-24 NOTE — ED Triage Notes (Signed)
Pt presents with lower abdominal pain and pain with urination X 2 days.

## 2021-01-25 LAB — CERVICOVAGINAL ANCILLARY ONLY
Bacterial Vaginitis (gardnerella): POSITIVE — AB
Chlamydia: NEGATIVE
Comment: NEGATIVE
Comment: NEGATIVE
Comment: NEGATIVE
Comment: NORMAL
Neisseria Gonorrhea: NEGATIVE
Trichomonas: NEGATIVE

## 2021-01-26 ENCOUNTER — Telehealth (HOSPITAL_COMMUNITY): Payer: Self-pay | Admitting: Emergency Medicine

## 2021-01-26 LAB — URINE CULTURE: Culture: >=100000 — AB

## 2021-01-26 MED ORDER — METRONIDAZOLE 500 MG PO TABS: 500.0000 mg | ORAL_TABLET | Freq: Two times a day (BID) | ORAL | 0 refills | Status: DC

## 2021-02-03 ENCOUNTER — Other Ambulatory Visit: Payer: Self-pay

## 2021-02-03 ENCOUNTER — Encounter: Payer: Self-pay | Admitting: Obstetrics & Gynecology

## 2021-02-03 ENCOUNTER — Ambulatory Visit (INDEPENDENT_AMBULATORY_CARE_PROVIDER_SITE_OTHER): Payer: Medicare Other | Admitting: Obstetrics & Gynecology

## 2021-02-03 VITALS — BP 114/74 | HR 89 | Ht 64.0 in | Wt 242.9 lb

## 2021-02-03 DIAGNOSIS — N951 Menopausal and female climacteric states: Secondary | ICD-10-CM | POA: Diagnosis not present

## 2021-02-03 DIAGNOSIS — Z114 Encounter for screening for human immunodeficiency virus [HIV]: Secondary | ICD-10-CM

## 2021-02-03 DIAGNOSIS — Z113 Encounter for screening for infections with a predominantly sexual mode of transmission: Secondary | ICD-10-CM | POA: Diagnosis not present

## 2021-02-03 NOTE — Progress Notes (Signed)
Patient ID: Paula Leon, female   DOB: 06-11-1966, 55 y.o.   MRN: [id number]  Chief Complaint  Patient presents with  . Follow-up    HPI Paula Leon is a 55 y.o. female.  [id number] No LMP recorded. Patient is premenopausal. She is perimenopausal with light menses lasting 1-2 days every 1-2 months. FSH was 47 and EMB was benign. She did not use the provera for cycle control. HPI  Past Medical History:  Diagnosis Date  . Anemia    IDA  . Anxiety   . Depression   . Hernia of abdominal wall   . History of drug abuse (HCC)    has not used in ten years  . 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 Transfusion history    due to anemia with hb 4.0    Past Surgical History:  Procedure Laterality Date  . INSERTION OF MESH N/A 04/19/2019   Procedure: INSERTION OF MESH;  Surgeon: Darnell Level, MD;  Location: [facility];  Service: General;  Laterality: N/A;  . SALPINGECTOMY Left    due to ruptured ectopic  . VENTRAL HERNIA REPAIR N/A 04/19/2019   Procedure: VENTRAL HERNIA REPAIR;  Surgeon: Darnell Level, MD;  Location: [facility];  Service: General;  Laterality: N/A;    Family History  Problem Relation Age of Onset  . Cancer Mother   . Cancer Father   . Colon cancer Neg Hx   . Colon polyps Neg Hx   . Esophageal cancer Neg Hx   . Rectal cancer Neg Hx   . Stomach cancer Neg Hx     Social History Social History   Tobacco Use  . Smoking status: Current Every Day Smoker    Packs/day: 0.25    Types: Cigarettes  . Smokeless tobacco: Never Used  . Tobacco comment:  2 cigarettes per day  Vaping Use  . Vaping Use: Never used  Substance Use Topics  . Alcohol use: Not Currently    Comment: 2012  . Drug use: Not Currently    Types: Heroin, Marijuana    Comment: last time used drugs was 2012    Allergies  Allergen Reactions  . Penicillins Swelling    Current Outpatient Medications  Medication Sig Dispense Refill  . ferrous sulfate 325 (65 FE) MG  tablet Take 1 tablet (325 mg total) by mouth daily with breakfast. 90 tablet 0  . methadone (DOLOPHINE) 10 MG/5ML solution Take 39 mg by mouth daily.     . metroNIDAZOLE (FLAGYL) 500 MG tablet Take 1 tablet (500 mg total) by mouth 2 (two) times daily. 14 tablet 0  . PARoxetine (PAXIL) 20 MG tablet Take 1 tablet (20 mg total) by mouth every evening. 90 tablet 0  . phenazopyridine (PYRIDIUM) 200 MG tablet Take 1 tablet (200 mg total) by mouth 3 (three) times daily as needed for pain. 15 tablet 0  . sulfamethoxazole-trimethoprim (BACTRIM DS) 800-160 MG tablet Take 1 tablet by mouth 2 (two) times daily. 10 tablet 0  . medroxyPROGESTERone (PROVERA) 10 MG tablet 10 mg PO daily for days 1-10 each month (Patient not taking: Reported on 02/03/2021) 30 tablet 2  . meloxicam (MOBIC) 15 MG tablet Take 1 tablet (15 mg total) by mouth daily. (Patient not taking: Reported on 02/03/2021) 30 tablet 2   No current facility-administered medications for this visit.    Review of Systems Review of Systems  Constitutional: Negative.   Genitourinary: Negative for menstrual problem, vaginal bleeding and  vaginal discharge.    Blood pressure 114/74, pulse 89, height 5\' 4"  (1.626 m), weight 242 lb 14.4 oz (110.2 kg).  Physical Exam Physical Exam Constitutional:      Appearance: Normal appearance. She is obese.  Pulmonary:     Effort: Pulmonary effort is normal.  Neurological:     Mental Status: She is alert.  Psychiatric:        Mood and Affect: Mood normal.     Data Reviewed FSH 47, Bx normal tissue  Assessment Screening examination for STD (sexually transmitted disease) - Plan: HIV Antibody (routine testing w rflx)  Encounter for screening for human immunodeficiency virus (HIV)  - Plan: HIV Antibody (routine testing w rflx)  Perimenopausal    Plan RTC for routine Gyn care, have annual mammogram    02/03/2021, 10:01 AM

## 2021-02-03 NOTE — Patient Instructions (Signed)
[employer] Textbook of Endocrinology (14th ed., pp. 574-641). [place], [place]: Elsevier.">  Perimenopause Perimenopause is the normal time of a woman's life when the levels of estrogen, the female hormone produced by the ovaries, begin to decrease. This leads to changes in menstrual periods before they stop completely (menopause). Perimenopause can begin 2-8 years before menopause. During perimenopause, the ovaries may or may not produce an egg and a woman can still become pregnant. What are the causes? This condition is caused by a natural change in hormone levels that happens as you get older. What increases the risk? This condition is more likely to start at an earlier age if you have certain medical conditions or have undergone treatments, including:  A tumor of the pituitary gland in the brain.  A disease that affects the ovaries and hormone production.  Certain cancer treatments, such as chemotherapy or hormone therapy, or radiation therapy on the pelvis.  Heavy smoking and excessive alcohol use.  Family history of early menopause. What are the signs or symptoms? Perimenopausal changes affect each woman differently. Symptoms of this condition may include:  Hot flashes.  Irregular menstrual periods.  Night sweats.  Changes in feelings about sex. This could be a decrease in sex drive or an increased discomfort around your sexuality.  Vaginal dryness.  Headaches.  Mood swings.  Depression.  Problems sleeping (insomnia).  Memory problems or trouble concentrating.  Irritability.  Tiredness.  Weight gain.  Anxiety.  Trouble getting pregnant. How is this diagnosed? This condition is diagnosed based on your medical history, a physical exam, your age, your menstrual history, and your symptoms. Hormone tests may also be done. How is this treated? In some cases, no treatment is needed. You and your health care provider should make a decision together about whether  treatment is necessary. Treatment will be based on your individual condition and preferences. Various treatments are available, such as:  Menopausal hormone therapy (MHT).  Medicines to treat specific symptoms.  Acupuncture.  Vitamin or herbal supplements. Before starting treatment, make sure to let your health care provider know if you have a personal or family history of:  Heart disease.  Breast cancer.  Blood clots.  Diabetes.  Osteoporosis. Follow these instructions at home: Medicines  Take over-the-counter and prescription medicines only as told by your health care provider.  Take vitamin supplements only as told by your health care provider.  Talk with your health care provider before starting any herbal supplements. Lifestyle  Do not use any products that contain nicotine or tobacco, such as cigarettes, e-cigarettes, and chewing tobacco. If you need help quitting, ask your health care provider.  Get at least 30 minutes of physical activity on 5 or more days each week.  Eat a balanced diet that includes fresh fruits and vegetables, whole grains, soybeans, eggs, lean meat, and low-fat dairy.  Avoid alcoholic and caffeinated beverages, as well as spicy foods. This may help prevent hot flashes.  Get 7-8 hours of sleep each night.  Dress in layers that can be removed to help you manage hot flashes.  Find ways to manage stress, such as deep breathing, meditation, or journaling.   General instructions  Keep track of your menstrual periods, including: ? When they occur. ? How heavy they are and how long they last. ? How much time passes between periods.  Keep track of your symptoms, noting when they start, how often you have them, and how long they last.  Use vaginal lubricants or moisturizers to help   with vaginal dryness and improve comfort during sex.  You can still become pregnant if you are having irregular periods. Make sure you use contraception during  perimenopause if you do not want to get pregnant.  Keep all follow-up visits. This is important. This includes any group therapy or counseling.   Contact a health care provider if:  You have heavy vaginal bleeding or pass blood clots.  Your period lasts more than 2 days longer than normal.  Your periods are recurring sooner than 21 days.  You bleed after having sex.  You have pain during sex. Get help right away if you have:  Chest pain, trouble breathing, or trouble talking.  Severe depression.  Pain when you urinate.  Severe headaches.  Vision problems. Summary  Perimenopause is the time when a woman's body begins to move into menopause. This may happen naturally or as a result of other health problems or medical treatments.  Perimenopause can begin 2-8 years before menopause, and it can last for several years.  Perimenopausal symptoms can be managed through medicines, lifestyle changes, and complementary therapies such as acupuncture. This information is not intended to replace advice given to you by your health care provider. Make sure you discuss any questions you have with your health care provider. Document Revised: 02/27/2020 Document Reviewed: 02/27/2020 Elsevier Patient Education  2021 Elsevier Inc.  

## 2021-02-05 LAB — HIV ANTIBODY (ROUTINE TESTING W REFLEX): HIV Screen 4th Generation wRfx: NONREACTIVE

## 2021-02-12 ENCOUNTER — Other Ambulatory Visit: Payer: Self-pay | Admitting: Internal Medicine

## 2021-02-12 DIAGNOSIS — F419 Anxiety disorder, unspecified: Secondary | ICD-10-CM

## 2021-02-15 ENCOUNTER — Telehealth: Payer: Self-pay

## 2021-02-15 NOTE — Telephone Encounter (Signed)
Pt is calling back regarding medicine [phone]

## 2021-02-15 NOTE — Telephone Encounter (Signed)
Received TC from patient who is at the pharmacy to pick up her Paxil, but states it hasn't been called in yet.  RN informed patient that MD's have 48 business hours to respond to RX request and in the future, to request refills before she is completely out of medication.  She verbalized understanding.  States she ran out of Paxil last Thursday. SChaplin, RN,BSN

## 2021-02-17 ENCOUNTER — Encounter (HOSPITAL_COMMUNITY): Payer: Self-pay

## 2021-02-17 ENCOUNTER — Other Ambulatory Visit: Payer: Self-pay

## 2021-02-17 ENCOUNTER — Ambulatory Visit (HOSPITAL_COMMUNITY)
Admission: EM | Admit: 2021-02-17 | Discharge: 2021-02-17 | Disposition: A | Discharge status: Home / Self Care | Payer: Medicare Other | Attending: Emergency Medicine | Admitting: Emergency Medicine

## 2021-02-17 DIAGNOSIS — K029 Dental caries, unspecified: Secondary | ICD-10-CM

## 2021-02-17 DIAGNOSIS — S025XXK Fracture of tooth (traumatic), subsequent encounter for fracture with nonunion: Secondary | ICD-10-CM

## 2021-02-17 MED ORDER — TRAMADOL HCL 50 MG PO TABS: 50.0000 mg | ORAL_TABLET | Freq: Four times a day (QID) | ORAL | 0 refills | Status: DC | PRN

## 2021-02-17 MED ORDER — CLINDAMYCIN HCL 150 MG PO CAPS: 150.0000 mg | ORAL_CAPSULE | Freq: Four times a day (QID) | ORAL | 0 refills | Status: DC

## 2021-02-17 NOTE — ED Triage Notes (Addendum)
Pt in with c/o right upper dental pain x 2 days  Pt has been taking ibuprofen for relief  Pt also requesting list of dentists in the area

## 2021-02-17 NOTE — ED Provider Notes (Signed)
[facility]    CSN: [id number] Arrival date & time: 02/17/21  6546      History   Chief Complaint Chief Complaint  Patient presents with  . Dental Pain    HPI Paula Leon is a 55 y.o. female.   Multiple dental caries, broken teeth. Has drainage from rt back upper tooth area. Pain is uncontrolled with motrin. Intermit fevers per pt.      Past Medical History:  Diagnosis Date  . Anemia    IDA  . Anxiety   . Depression   . Hernia of abdominal wall   . History of drug abuse (HCC)    has not used in ten years  . 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 Transfusion history    due to anemia with hb 4.0    Patient Active Problem List   Diagnosis Date Noted  . Chronic pain of both knees 10/23/2020  . Anemia   . Tobacco user 06/16/2020  . Heavy menstrual bleeding 06/16/2020  . Anxiety and depression 06/16/2020  . Iron deficiency anemia due to chronic blood loss 06/16/2020  . Healthcare maintenance 06/16/2020  . Ventral hernia 04/14/2019  . Abnormal uterine bleeding 01/25/2018  . Other abnormal glucose 05/05/2014  . Morbid obesity (HCC) 03/27/2014  . Opioid dependence (HCC) 03/27/2014  . Panic attack 03/17/2011    Past Surgical History:  Procedure Laterality Date  . INSERTION OF MESH N/A 04/19/2019   Procedure: INSERTION OF MESH;  Surgeon: Darnell Level, MD;  Location: [facility];  Service: General;  Laterality: N/A;  . SALPINGECTOMY Left    due to ruptured ectopic  . VENTRAL HERNIA REPAIR N/A 04/19/2019   Procedure: VENTRAL HERNIA REPAIR;  Surgeon: Darnell Level, MD;  Location: [facility];  Service: General;  Laterality: N/A;    OB History    Gravida  10   Para  5   Term  5   Preterm  0   AB  5   Living  4     SAB  2   IAB  3   Ectopic  0   Multiple  0   Live Births  5            Home Medications    Prior to Admission medications   Medication Sig Start Date End Date Taking? Authorizing  Provider  clindamycin (CLEOCIN) 150 MG capsule Take 1 capsule (150 mg total) by mouth every 6 (six) hours. 02/17/21  Yes Coralyn Mark, NP  traMADol (ULTRAM) 50 MG tablet Take 1 tablet (50 mg total) by mouth every 6 (six) hours as needed. 02/17/21  Yes Coralyn Mark, NP  ferrous sulfate 325 (65 FE) MG tablet Take 1 tablet (325 mg total) by mouth daily with breakfast. 10/23/20   [place], Karma Ganja D, DO  medroxyPROGESTERone (PROVERA) 10 MG tablet 10 mg PO daily for days 1-10 each month Patient not taking: Reported on 02/03/2021 11/23/20   Adam Phenix, MD  meloxicam (MOBIC) 15 MG tablet Take 1 tablet (15 mg total) by mouth daily. Patient not taking: Reported on 02/03/2021 10/23/20 10/23/21  Lenward Chancellor D, DO  methadone (DOLOPHINE) 10 MG/5ML solution Take 39 mg by mouth daily.     [provider]  metroNIDAZOLE (FLAGYL) 500 MG tablet Take 1 tablet (500 mg total) by mouth 2 (two) times daily. 01/26/21   Merrilee Jansky, MD  PARoxetine (PAXIL) 20 MG tablet TAKE 1 TABLET(20 MG) BY  MOUTH EVERY EVENING 02/15/21   Remo Lipps, MD  phenazopyridine (PYRIDIUM) 200 MG tablet Take 1 tablet (200 mg total) by mouth 3 (three) times daily as needed for pain. 01/24/21   Wallis Bamberg, PA-C  sulfamethoxazole-trimethoprim (BACTRIM DS) 800-160 MG tablet Take 1 tablet by mouth 2 (two) times daily. 01/24/21   Wallis Bamberg, PA-C  famotidine (PEPCID) 20 MG tablet Take 1 tablet (20 mg total) by mouth 2 (two) times daily. Patient not taking: Reported on 09/03/2019 07/28/19 03/10/20  Merrilee Jansky, MD    Family History Family History  Problem Relation Age of Onset  . Cancer Mother   . Cancer Father   . Colon cancer Neg Hx   . Colon polyps Neg Hx   . Esophageal cancer Neg Hx   . Rectal cancer Neg Hx   . Stomach cancer Neg Hx     Social History Social History   Tobacco Use  . Smoking status: Current Every Day Smoker    Packs/day: 0.25    Types: Cigarettes  . Smokeless tobacco: Never Used  .  Tobacco comment:  2 cigarettes per day  Vaping Use  . Vaping Use: Never used  Substance Use Topics  . Alcohol use: Not Currently    Comment: 2012  . Drug use: Not Currently    Types: Heroin, Marijuana    Comment: last time used drugs was 2012     Allergies   Penicillins   Review of Systems Review of Systems  Constitutional: Positive for appetite change and chills.  HENT: Positive for dental problem.   Eyes: Negative.   Respiratory: Negative.   Cardiovascular: Negative.      Physical Exam Triage Vital Signs ED Triage Vitals  Enc Vitals Group     BP 02/17/21 0941 125/80     Pulse Rate 02/17/21 0941 97     Resp 02/17/21 0941 20     Temp 02/17/21 0941 98.2 F (36.8 C)     Temp Source 02/17/21 0941 Oral     SpO2 02/17/21 0941 97 %     Weight --      Height --      Head Circumference --      Peak Flow --      Pain Score 02/17/21 0937 10     Pain Loc --      Pain Edu? --      Excl. in GC? --    No data found.  Updated Vital Signs BP 125/80 (BP Location: Left Arm)   Pulse 97   Temp 98.2 F (36.8 C) (Oral)   Resp 20   SpO2 97%   Visual Acuity     Physical Exam Constitutional:      General: She is in acute distress.  HENT:     Right Ear: Tympanic membrane normal.     Left Ear: Tympanic membrane normal.     Nose: Nose normal.     Mouth/Throat:     Pharynx: Posterior oropharyngeal erythema present.     Comments: Multiple dental caries to various areas. RT upper molar yellow drainage and erythema noted.  Neurological:     Mental Status: She is alert.      UC Treatments / Results  Labs (all labs ordered are listed, but only abnormal results are displayed) Labs Reviewed - No data to display  EKG   Radiology No results found.  Procedures Procedures (including critical care time)  Medications Ordered in UC Medications - No data to display  Initial Impression / Assessment and Plan / UC Course  I have reviewed the triage vital signs and the  nursing notes.  Pertinent labs & imaging results that were available during my care of the patient were reviewed by me and considered in my medical decision making (see chart for details).    Will need to see dentist for removal  Can take motrin / tylenol as needed q 8 hours  Final Clinical Impressions(s) / UC Diagnoses   Final diagnoses:  Pain due to dental caries  Closed fracture of tooth with nonunion, subsequent encounter   Discharge Instructions   None    ED Prescriptions    Medication Sig Dispense Auth. Provider   traMADol (ULTRAM) 50 MG tablet Take 1 tablet (50 mg total) by mouth every 6 (six) hours as needed. 15 tablet Maple Mirza L, NP   clindamycin (CLEOCIN) 150 MG capsule Take 1 capsule (150 mg total) by mouth every 6 (six) hours. 28 capsule Coralyn Mark, NP     I have reviewed the PDMP during this encounter.   Coralyn Mark, NP 02/17/21 1008

## 2021-02-20 ENCOUNTER — Encounter: Payer: Self-pay | Admitting: *Deleted

## 2021-03-26 ENCOUNTER — Emergency Department (HOSPITAL_COMMUNITY)
Admission: EM | Admit: 2021-03-26 | Discharge: 2021-03-26 | Disposition: A | Discharge status: Home / Self Care | Payer: Medicare Other | Attending: Emergency Medicine | Admitting: Emergency Medicine

## 2021-03-26 ENCOUNTER — Other Ambulatory Visit: Payer: Self-pay

## 2021-03-26 ENCOUNTER — Encounter (HOSPITAL_COMMUNITY): Payer: Self-pay | Admitting: Emergency Medicine

## 2021-03-26 ENCOUNTER — Emergency Department (HOSPITAL_COMMUNITY): Payer: Medicare Other

## 2021-03-26 ENCOUNTER — Emergency Department (HOSPITAL_COMMUNITY)
Admission: EM | Admit: 2021-03-26 | Discharge: 2021-03-26 | Disposition: A | Discharge status: Home / Self Care | Payer: Medicare Other | Source: Home / Self Care | Attending: Emergency Medicine | Admitting: Emergency Medicine

## 2021-03-26 DIAGNOSIS — Z20822 Contact with and (suspected) exposure to covid-19: Secondary | ICD-10-CM | POA: Insufficient documentation

## 2021-03-26 DIAGNOSIS — F410 Panic disorder [episodic paroxysmal anxiety] without agoraphobia: Secondary | ICD-10-CM | POA: Insufficient documentation

## 2021-03-26 DIAGNOSIS — Z5321 Procedure and treatment not carried out due to patient leaving prior to being seen by health care provider: Secondary | ICD-10-CM | POA: Insufficient documentation

## 2021-03-26 DIAGNOSIS — F17210 Nicotine dependence, cigarettes, uncomplicated: Secondary | ICD-10-CM | POA: Insufficient documentation

## 2021-03-26 DIAGNOSIS — F419 Anxiety disorder, unspecified: Secondary | ICD-10-CM | POA: Insufficient documentation

## 2021-03-26 DIAGNOSIS — R0602 Shortness of breath: Secondary | ICD-10-CM | POA: Insufficient documentation

## 2021-03-26 DIAGNOSIS — Z79899 Other long term (current) drug therapy: Secondary | ICD-10-CM | POA: Diagnosis not present

## 2021-03-26 DIAGNOSIS — R61 Generalized hyperhidrosis: Secondary | ICD-10-CM | POA: Diagnosis not present

## 2021-03-26 LAB — COMPREHENSIVE METABOLIC PANEL
ALT: 12 U/L (ref 0–44)
AST: 17 U/L (ref 15–41)
Albumin: 3.3 g/dL — ABNORMAL LOW (ref 3.5–5.0)
Alkaline Phosphatase: 80 U/L (ref 38–126)
Anion gap: 9 (ref 5–15)
BUN: 13 mg/dL (ref 6–20)
CO2: 26 mmol/L (ref 22–32)
Calcium: 9 mg/dL (ref 8.9–10.3)
Chloride: 99 mmol/L (ref 98–111)
Creatinine, Ser: 0.89 mg/dL (ref 0.44–1.00)
GFR, Estimated: >60 mL/min (ref >60)
Glucose, Bld: 121 mg/dL — ABNORMAL HIGH (ref 70–99)
Potassium: 4.2 mmol/L (ref 3.5–5.1)
Sodium: 134 mmol/L — ABNORMAL LOW (ref 135–145)
Total Bilirubin: 0.5 mg/dL (ref 0.3–1.2)
Total Protein: 6.5 g/dL (ref 6.5–8.1)

## 2021-03-26 LAB — CBC
HCT: 34.2 % — ABNORMAL LOW (ref 36.0–46.0)
Hemoglobin: 10.5 g/dL — ABNORMAL LOW (ref 12.0–15.0)
MCH: 26.4 pg (ref 26.0–34.0)
MCHC: 30.7 g/dL (ref 30.0–36.0)
MCV: 86.1 fL (ref 80.0–100.0)
Platelets: 184 10*3/uL (ref 150–400)
RBC: 3.97 MIL/uL (ref 3.87–5.11)
RDW: 15.1 % (ref 11.5–15.5)
WBC: 4 10*3/uL (ref 4.0–10.5)
nRBC: 0 % (ref 0.0–0.2)

## 2021-03-26 LAB — RAPID URINE DRUG SCREEN, HOSP PERFORMED
Amphetamines: NOT DETECTED
Barbiturates: NOT DETECTED
Benzodiazepines: NOT DETECTED
Cocaine: NOT DETECTED
Opiates: NOT DETECTED
Tetrahydrocannabinol: NOT DETECTED

## 2021-03-26 LAB — I-STAT BETA HCG BLOOD, ED (MC, WL, AP ONLY): I-stat hCG, quantitative: <5 m[IU]/mL (ref <5)

## 2021-03-26 LAB — RESP PANEL BY RT-PCR (FLU A&B, COVID) ARPGX2
Influenza A by PCR: NEGATIVE
Influenza B by PCR: NEGATIVE
SARS Coronavirus 2 by RT PCR: NEGATIVE

## 2021-03-26 LAB — ACETAMINOPHEN LEVEL: Acetaminophen (Tylenol), Serum: <10 ug/mL — ABNORMAL LOW (ref 10–30)

## 2021-03-26 LAB — SALICYLATE LEVEL: Salicylate Lvl: <7 mg/dL — ABNORMAL LOW (ref 7.0–30.0)

## 2021-03-26 LAB — ETHANOL: Alcohol, Ethyl (B): <10 mg/dL (ref <10)

## 2021-03-26 LAB — TROPONIN I (HIGH SENSITIVITY): Troponin I (High Sensitivity): 2 ng/L (ref <18)

## 2021-03-26 MED ORDER — HYDROXYZINE HCL 25 MG PO TABS
50.0000 mg | ORAL_TABLET | Freq: Once | ORAL | Status: AC
  Administered 2021-03-26: 50 mg via ORAL
  Filled 2021-03-26: qty 2

## 2021-03-26 MED ORDER — LORAZEPAM 2 MG/ML IJ SOLN
2.0000 mg | Freq: Once | INTRAMUSCULAR | Status: DC
  Filled 2021-03-26: qty 1

## 2021-03-26 MED ORDER — HYDROXYZINE HCL 25 MG PO TABS: 25.0000 mg | ORAL_TABLET | Freq: Three times a day (TID) | ORAL | 0 refills | Status: DC | PRN

## 2021-03-26 NOTE — ED Triage Notes (Addendum)
Pt reports she just woke up and started having a panic attack. Pt reports yesterday methadone medication change to 10mg  from 37mg  daily. Pt reports hot flashes and anxiety. Also a cold in the past week. Pt anxious in triage, tachypneic, pacing room and reporting she cant sit still. Pt reports she can't breathe and needs to get some air. Pt walked out in the middle of triage d/t needing to go outside. This RN attempted to reiterate to pt the need for EKG to be completed. Pt continued to walk outside and stated she will be back.

## 2021-03-26 NOTE — ED Provider Notes (Signed)
[facility] EMERGENCY DEPARTMENT Provider Note   CSN: [id number] Arrival date & time: 03/26/21  1154     History Chief Complaint  Patient presents with   Panic Attack    Paula Leon is a 55 y.o. female history of depression, drug abuse on methadone here presenting with panic attacks.  Patient's methadone dose was recently decreased.  Patient also had stopped taking her Paxil several days ago.  She came in last night with similar symptoms.  She was tachycardic and had labs drawn and unremarkable EKG and troponin.  She was given her Paxil in the ED and initially felt better.  After she came home she had another panic attack.  She states that it is exactly the same as when she came to the ER yesterday.  Patient denies any thoughts of harming herself or others  The history is provided by the patient.      Past Medical History:  Diagnosis Date   Anemia    IDA   Anxiety    Depression    Hernia of abdominal wall    History of drug abuse (HCC)    has not used in ten years   Substance abuse (HCC)    2012 QUIT Heroin   Transfusion history    due to anemia with hb 4.0    Patient Active Problem List   Diagnosis Date Noted   Chronic pain of both knees 10/23/2020   Anemia    Tobacco user 06/16/2020   Heavy menstrual bleeding 06/16/2020   Anxiety and depression 06/16/2020   Iron deficiency anemia due to chronic blood loss 06/16/2020   Healthcare maintenance 06/16/2020   Ventral hernia 04/14/2019   Abnormal uterine bleeding 01/25/2018   Other abnormal glucose 05/05/2014   Morbid obesity (HCC) 03/27/2014   Opioid dependence (HCC) 03/27/2014   Panic attack 03/17/2011    Past Surgical History:  Procedure Laterality Date   INSERTION OF MESH N/A 04/19/2019   Procedure: INSERTION OF MESH;  Surgeon: Darnell Level, MD;  Location: [facility];  Service: General;  Laterality: N/A;   SALPINGECTOMY Left    due to ruptured ectopic   VENTRAL HERNIA REPAIR N/A  04/19/2019   Procedure: VENTRAL HERNIA REPAIR;  Surgeon: Darnell Level, MD;  Location: [facility];  Service: General;  Laterality: N/A;     OB History     Gravida  10   Para  5   Term  5   Preterm  0   AB  5   Living  4      SAB  2   IAB  3   Ectopic  0   Multiple  0   Live Births  5           Family History  Problem Relation Age of Onset   Cancer Mother    Cancer Father    Colon cancer Neg Hx    Colon polyps Neg Hx    Esophageal cancer Neg Hx    Rectal cancer Neg Hx    Stomach cancer Neg Hx     Social History   Tobacco Use   Smoking status: Every Day    Packs/day: 0.25    Pack years: 0.00    Types: Cigarettes   Smokeless tobacco: Never   Tobacco comments:     2 cigarettes per day  Vaping Use   Vaping Use: Never used  Substance Use Topics   Alcohol use: Not Currently  Comment: 2012   Drug use: Not Currently    Types: Heroin, Marijuana    Comment: last time used drugs was 2012    Home Medications Prior to Admission medications   Medication Sig Start Date End Date Taking? Authorizing Provider  methadone (DOLOPHINE) 10 MG/5ML solution Take 37 mg by mouth every morning.   Yes [provider]  PARoxetine (PAXIL) 20 MG tablet TAKE 1 TABLET(20 MG) BY MOUTH EVERY EVENING Patient taking differently: Take 20 mg by mouth at bedtime. 02/15/21  Yes Remo Lipps, MD  ferrous sulfate 325 (65 FE) MG tablet Take 1 tablet (325 mg total) by mouth daily with breakfast. Patient not taking: No sig reported 10/23/20   Lenward Chancellor D, DO  medroxyPROGESTERone (PROVERA) 10 MG tablet 10 mg PO daily for days 1-10 each month Patient not taking: No sig reported 11/23/20   Adam Phenix, MD  famotidine (PEPCID) 20 MG tablet Take 1 tablet (20 mg total) by mouth 2 (two) times daily. Patient not taking: Reported on 09/03/2019 07/28/19 03/10/20  Merrilee Jansky, MD    Allergies    Penicillins  Review of Systems   Review of Systems   Psychiatric/Behavioral:  The patient is nervous/anxious.   All other systems reviewed and are negative.  Physical Exam Updated Vital Signs BP (!) 134/96   Pulse (!) 121   Temp 98.4 F (36.9 C) (Oral)   Resp (!) 22   LMP 03/25/2021   SpO2 97%   Physical Exam Vitals and nursing note reviewed.  Constitutional:      Comments: Anxious   HENT:     Head: Normocephalic.     Nose: Nose normal.     Mouth/Throat:     Mouth: Mucous membranes are moist.  Eyes:     Extraocular Movements: Extraocular movements intact.     Pupils: Pupils are equal, round, and reactive to light.  Cardiovascular:     Rate and Rhythm: Regular rhythm. Tachycardia present.  Pulmonary:     Effort: Pulmonary effort is normal.     Breath sounds: Normal breath sounds.  Abdominal:     General: Abdomen is flat.     Palpations: Abdomen is soft.  Musculoskeletal:        General: Normal range of motion.     Cervical back: Normal range of motion and neck supple.  Skin:    General: Skin is warm.     Capillary Refill: Capillary refill takes less than 2 seconds.  Neurological:     General: No focal deficit present.     Mental Status: She is oriented to person, place, and time.  Psychiatric:     Comments: Anxious but not suicidal    ED Results / Procedures / Treatments   Labs (all labs ordered are listed, but only abnormal results are displayed) Labs Reviewed - No data to display  EKG None  Radiology DG Chest 2 View  Result Date: 03/26/2021 CLINICAL DATA:  55 year old female with shortness of breath, panic attack. EXAM: CHEST - 2 VIEW COMPARISON:  Abdomen radiographs 02/23/2019. FINDINGS: PA and lateral views of the chest. Eventration of the diaphragm, normal variant. Lung volumes are at the upper limits of normal. There is mild cardiomegaly, with some suspected right atrial enlargement on the lateral. Other mediastinal contours are within normal limits. Visualized tracheal air column is within normal limits.  No pneumothorax, pulmonary edema or confluent pulmonary opacity. No definite pleural effusion. Disc and endplate degeneration in the spine. No acute osseous  abnormality identified. Negative visible bowel gas pattern. IMPRESSION: Cardiomegaly.  But no acute cardiopulmonary abnormality identified. Electronically Signed   By: Odessa Fleming M.D.   On: 03/26/2021 04:41    Procedures Procedures   Medications Ordered in ED Medications  hydrOXYzine (ATARAX/VISTARIL) tablet 50 mg (has no administration in time range)    ED Course  I have reviewed the triage vital signs and the nursing notes.  Pertinent labs & imaging results that were available during my care of the patient were reviewed by me and considered in my medical decision making (see chart for details).    MDM Rules/Calculators/A&P                          Paula Leon is a 55 y.o. female here presenting with anxiety and panic attack.  Reviewed her ED visit from yesterday.  She had extensive labs and EKG done at that time.  I agree that she likely is withdrawal from Paxil and decreased dose of methadone.  She is unable to get into methadone clinic until next week.  She has Paxil at home and I encouraged her to take it.  Initially ordered some Ativan but she does not want any benzos so I tried hydroxyzine  9:19 PM Patient feeling much better now her heart rate is down to 99.  She has no tremors and felt good enough to go home.  We will give short course of hydroxyzine as needed.  I told her to take Paxil as prescribed and talk to her medical doctor about increasing the dose.  Final Clinical Impression(s) / ED Diagnoses Final diagnoses:  None    Rx / DC Orders ED Discharge Orders     None        Charlynne Pander, MD 03/26/21 2120

## 2021-03-26 NOTE — Discharge Instructions (Addendum)
You can take Atarax as needed for panic attacks  You need to continue taking your Paxil and methadone.  Talk to your methadone doctor about increasing the dose.  If you want to take something other than Paxil you need to talk to your psychiatrist  Return to ER if you have worse panic attacks, thoughts of harming yourself or others, hallucinations

## 2021-03-26 NOTE — ED Notes (Signed)
EKG handed to Dr. Preston Fleeting for STEMI check

## 2021-03-26 NOTE — ED Notes (Signed)
All care rendered by provider. Pt ambulated out of ER with a steady gait.

## 2021-03-26 NOTE — ED Provider Notes (Signed)
[facility] EMERGENCY DEPARTMENT Provider Note   CSN: [id number] Arrival date & time: 03/26/21  0301     History Chief Complaint  Patient presents with   Anxiety   Panic Attack    Paula Leon is a 55 y.o. female.  The history is provided by the patient.  Anxiety This is a new problem. The current episode started 3 to 5 hours ago. The problem occurs constantly. The problem has been gradually improving. Associated symptoms include shortness of breath. Pertinent negatives include no abdominal pain. Nothing aggravates the symptoms.  Patient history of anemia, anxiety presents with a panic attack.  Patient reports around 1 AM she woke up feeling short of breath and her heart was racing.  She also felt diaphoretic.  She tried to breathe through the symptoms but it did not help.  She admits that she just stopped her Paxil approximately 2-3 days ago when she did she took 1 of those and it seemed She also reports she recently had her methadone decreased as an outpatient. She is now feeling improved    Past Medical History:  Diagnosis Date   Anemia    IDA   Anxiety    Depression    Hernia of abdominal wall    History of drug abuse (HCC)    has not used in ten years   Substance abuse (HCC)    2012 QUIT Heroin   Transfusion history    due to anemia with hb 4.0    Patient Active Problem List   Diagnosis Date Noted   Chronic pain of both knees 10/23/2020   Anemia    Tobacco user 06/16/2020   Heavy menstrual bleeding 06/16/2020   Anxiety and depression 06/16/2020   Iron deficiency anemia due to chronic blood loss 06/16/2020   Healthcare maintenance 06/16/2020   Ventral hernia 04/14/2019   Abnormal uterine bleeding 01/25/2018   Other abnormal glucose 05/05/2014   Morbid obesity (HCC) 03/27/2014   Opioid dependence (HCC) 03/27/2014   Panic attack 03/17/2011    Past Surgical History:  Procedure Laterality Date   INSERTION OF MESH N/A 04/19/2019   Procedure:  INSERTION OF MESH;  Surgeon: Darnell Level, MD;  Location: [facility];  Service: General;  Laterality: N/A;   SALPINGECTOMY Left    due to ruptured ectopic   VENTRAL HERNIA REPAIR N/A 04/19/2019   Procedure: VENTRAL HERNIA REPAIR;  Surgeon: Darnell Level, MD;  [facility];  Service: General;  Laterality: N/A;     OB History     Gravida  10   Para  5   Term  5   Preterm  0   AB  5   Living  4      SAB  2   IAB  3   Ectopic  0   Multiple  0   Live Births  5           Family History  Problem Relation Age of Onset   Cancer Mother    Cancer Father    Colon cancer Neg Hx    Colon polyps Neg Hx    Esophageal cancer Neg Hx    Rectal cancer Neg Hx    Stomach cancer Neg Hx     Social History   Tobacco Use   Smoking status: Every Day    Packs/day: 0.25    Pack years: 0.00    Types: Cigarettes   Smokeless tobacco: Never   Tobacco comments:  2 cigarettes per day  Vaping Use   Vaping Use: Never used  Substance Use Topics   Alcohol use: Not Currently    Comment: 2012   Drug use: Not Currently    Types: Heroin, Marijuana    Comment: last time used drugs was 2012    Home Medications Prior to Admission medications   Medication Sig Start Date End Date Taking? Authorizing Provider  ferrous sulfate 325 (65 FE) MG tablet Take 1 tablet (325 mg total) by mouth daily with breakfast. 10/23/20   [place], Carley D, DO  medroxyPROGESTERone (PROVERA) 10 MG tablet 10 mg PO daily for days 1-10 each month Patient not taking: Reported on 02/03/2021 11/23/20   Adam Phenix, MD  methadone (DOLOPHINE) 10 MG/5ML solution Take 39 mg by mouth daily.     [provider]  metroNIDAZOLE (FLAGYL) 500 MG tablet Take 1 tablet (500 mg total) by mouth 2 (two) times daily. 01/26/21   Merrilee Jansky, MD  PARoxetine (PAXIL) 20 MG tablet TAKE 1 TABLET(20 MG) BY MOUTH EVERY EVENING 02/15/21   Remo Lipps, MD  famotidine (PEPCID) 20 MG  tablet Take 1 tablet (20 mg total) by mouth 2 (two) times daily. Patient not taking: Reported on 09/03/2019 07/28/19 03/10/20  Merrilee Jansky, MD    Allergies    Penicillins  Review of Systems   Review of Systems  Constitutional:  Negative for fever.  Respiratory:  Positive for shortness of breath.   Gastrointestinal:  Negative for abdominal pain.  Psychiatric/Behavioral:  The patient is nervous/anxious.   All other systems reviewed and are negative.  Physical Exam Updated Vital Signs BP 133/77   Pulse 85   Temp (!) 97 F (36.1 C) (Tympanic)   Resp 16   LMP 03/25/2021   SpO2 99%   Physical Exam CONSTITUTIONAL: Well developed/well nourished, mildly anxious HEAD: Normocephalic/atraumatic EYES: EOMI/PERRL ENMT: Mucous membranes moist NECK: supple no meningeal signs SPINE/BACK:entire spine nontender CV: S1/S2 noted, no murmurs/rubs/gallops noted LUNGS: Lungs are clear to auscultation bilaterally, no apparent distress ABDOMEN: soft, nontender, no rebound or guarding, bowel sounds noted throughout abdomen GU:no cva tenderness NEURO: Pt is awake/alert/appropriate, moves all extremitiesx4.  No facial droop.  Ambulatory without difficulty EXTREMITIES: pulses normal/equal, full ROM SKIN: warm, color normal PSYCH: anxious  ED Results / Procedures / Treatments   Labs (all labs ordered are listed, but only abnormal results are displayed) Labs Reviewed  CBC - Abnormal; Notable for the following components:      Result Value   Hemoglobin 10.5 (*)    HCT 34.2 (*)    All other components within normal limits  SALICYLATE LEVEL - Abnormal; Notable for the following components:   Salicylate Lvl <7.0 (*)    All other components within normal limits  ACETAMINOPHEN LEVEL - Abnormal; Notable for the following components:   Acetaminophen (Tylenol), Serum <10 (*)    All other components within normal limits  COMPREHENSIVE METABOLIC PANEL - Abnormal; Notable for the following components:    Sodium 134 (*)    Glucose, Bld 121 (*)    Albumin 3.3 (*)    All other components within normal limits  RESP PANEL BY RT-PCR (FLU A&B, COVID) ARPGX2  ETHANOL  RAPID URINE DRUG SCREEN, HOSP PERFORMED  I-STAT BETA HCG BLOOD, ED ([facility], [facility], [facility] ONLY)  TROPONIN I (HIGH SENSITIVITY)    EKG EKG Interpretation  Date/Time:  Friday March 26 2021 05:45:24 EDT Ventricular Rate:  90 PR Interval:  170 QRS Duration: 88  QT Interval:  347 QTC Calculation: 425 R Axis:   73 Text Interpretation: Sinus rhythm Low voltage, precordial leads Confirmed by Zadie Rhine ([id number]) on 03/26/2021 6:13:07 AM  Radiology DG Chest 2 View  Result Date: 03/26/2021 CLINICAL DATA:  55 year old female with shortness of breath, panic attack. EXAM: CHEST - 2 VIEW COMPARISON:  Abdomen radiographs 02/23/2019. FINDINGS: PA and lateral views of the chest. Eventration of the diaphragm, normal variant. Lung volumes are at the upper limits of normal. There is mild cardiomegaly, with some suspected right atrial enlargement on the lateral. Other mediastinal contours are within normal limits. Visualized tracheal air column is within normal limits. No pneumothorax, pulmonary edema or confluent pulmonary opacity. No definite pleural effusion. Disc and endplate degeneration in the spine. No acute osseous abnormality identified. Negative visible bowel gas pattern. IMPRESSION: Cardiomegaly.  But no acute cardiopulmonary abnormality identified. Electronically Signed   By: Odessa Fleming M.D.   On: 03/26/2021 04:41    Procedures Procedures   Medications Ordered in ED Medications - No data to display  ED Course  I have reviewed the triage vital signs and the nursing notes.  Pertinent labs & imaging results that were available during my care of the patient were reviewed by me and considered in my medical decision making (see chart for details).    MDM Rules/Calculators/A&P                          Patient presents after presumed panic  attack.  She reports she woke up feeling short of breath and her heart was racing and diaphoretic.  She is now back to baseline She reports she just had her methadone dosing decreased.  She also decided to stop her Paxil about 2-3 days ago.  She reports after the panic attack started she did take her Paxil with some improvement. I suspect the abrupt cessation of Paxil played a role in the symptoms as well as decreasing the methadone Patient is now back to baseline.  She will restart the Paxil until she sees her PCP in the next few weeks Overall work-up in the emergency department is reassuring Final Clinical Impression(s) / ED Diagnoses Final diagnoses:  Panic attack    Rx / DC Orders ED Discharge Orders     None        Zadie Rhine, MD 03/26/21 [phone]

## 2021-03-26 NOTE — ED Provider Notes (Signed)
Emergency Medicine Provider Triage Evaluation Note  Paula Leon , a 55 y.o. female  was evaluated in triage.  Pt complains of possible panic attacks. Pt has a h/o anxiety, depression and panic attacks. Last week intermittently took Paxil during menstrual cycle and restarted in yesterday. Recently also had her methadone dosage decreased. Last seen in the ED 5-6 hours ago and discharged in stable condition. Her sx worsened after leaving the ED once again. Last took Paxil about 2 hours PTA.   Physical Exam  BP (!) 153/113   Pulse (!) 112   Temp 98.4 F (36.9 C) (Oral)   Resp 18   LMP 03/25/2021   SpO2 95%  Gen:   Awake, no distress   Resp:  Normal effort  MSK:   Moves extremities without difficulty  Other:    Medical Decision Making  Medically screening exam initiated at 1:10 PM.  Appropriate orders placed.  Paula Leon was informed that the remainder of the evaluation will be completed by another provider, this initial triage assessment does not replace that evaluation, and the importance of remaining in the ED until their evaluation is complete.     Placido Sou, PA-C 03/26/21 1312    Horton, Clabe Seal, DO 03/29/21 1745

## 2021-03-26 NOTE — ED Triage Notes (Signed)
Pt seen earlier today for same. Pt reports panic attacks becoming more frequent and is sweating. Pt reports she can't get her anxiety under control. Pt reports she already took her anxiety medicine today and it only helped briefly. Pt pacing back in forth and lobby, very anxious. Denies pain. Hx of anxiety.

## 2021-03-26 NOTE — Discharge Instructions (Addendum)
Please follow-up with your doctor in 2 weeks.  Please go back on your daily Paxil until you see your primary doctor.

## 2021-03-26 NOTE — ED Triage Notes (Addendum)
Pt returned to triage reporting she's just trying to calm down. Pt reports LMP yesterday and that she was bleeding more than normal. Pt reports she is feeling shaky and would like for her blood work to be tested for methadone level. Denies SI/HI.This RN reported to pt that standing orders will be initiated at this time and she will be seen by a provider when she gets to a room for further work up. Pt walked out of triage again before lab work being collected. Pt stated she needs to go to her car and that she will be back for lab work.

## 2021-03-27 ENCOUNTER — Emergency Department (HOSPITAL_COMMUNITY)
Admission: EM | Admit: 2021-03-27 | Discharge: 2021-03-27 | Disposition: A | Discharge status: Home / Self Care | Payer: Medicare Other | Source: Home / Self Care

## 2021-03-27 ENCOUNTER — Encounter (HOSPITAL_COMMUNITY): Payer: Self-pay | Admitting: *Deleted

## 2021-03-27 ENCOUNTER — Other Ambulatory Visit: Payer: Self-pay

## 2021-03-27 NOTE — ED Triage Notes (Signed)
The pt reports that she has had panic attacks for the past 2 days  she reports that she does not know why she started having them again  sweating  her daughter is with her

## 2021-03-27 NOTE — ED Notes (Signed)
Pt stated she was leaving AMA 

## 2021-03-30 ENCOUNTER — Encounter: Payer: Self-pay | Admitting: *Deleted

## 2021-04-07 ENCOUNTER — Encounter (HOSPITAL_COMMUNITY): Payer: Self-pay

## 2021-04-07 ENCOUNTER — Emergency Department (HOSPITAL_COMMUNITY): Payer: Medicare Other

## 2021-04-07 ENCOUNTER — Telehealth: Payer: Self-pay | Admitting: *Deleted

## 2021-04-07 ENCOUNTER — Emergency Department (HOSPITAL_COMMUNITY)
Admission: EM | Admit: 2021-04-07 | Discharge: 2021-04-07 | Disposition: A | Discharge status: Home / Self Care | Payer: Medicare Other | Attending: Emergency Medicine | Admitting: Emergency Medicine

## 2021-04-07 ENCOUNTER — Other Ambulatory Visit: Payer: Self-pay

## 2021-04-07 DIAGNOSIS — F17210 Nicotine dependence, cigarettes, uncomplicated: Secondary | ICD-10-CM | POA: Insufficient documentation

## 2021-04-07 DIAGNOSIS — R002 Palpitations: Secondary | ICD-10-CM | POA: Insufficient documentation

## 2021-04-07 DIAGNOSIS — F419 Anxiety disorder, unspecified: Secondary | ICD-10-CM

## 2021-04-07 LAB — BASIC METABOLIC PANEL
Anion gap: 7 (ref 5–15)
BUN: 15 mg/dL (ref 6–20)
CO2: 25 mmol/L (ref 22–32)
Calcium: 8.8 mg/dL — ABNORMAL LOW (ref 8.9–10.3)
Chloride: 101 mmol/L (ref 98–111)
Creatinine, Ser: 0.76 mg/dL (ref 0.44–1.00)
GFR, Estimated: >60 mL/min (ref >60)
Glucose, Bld: 110 mg/dL — ABNORMAL HIGH (ref 70–99)
Potassium: 3.6 mmol/L (ref 3.5–5.1)
Sodium: 133 mmol/L — ABNORMAL LOW (ref 135–145)

## 2021-04-07 LAB — CBC
HCT: 35 % — ABNORMAL LOW (ref 36.0–46.0)
Hemoglobin: 11.1 g/dL — ABNORMAL LOW (ref 12.0–15.0)
MCH: 26.6 pg (ref 26.0–34.0)
MCHC: 31.7 g/dL (ref 30.0–36.0)
MCV: 83.7 fL (ref 80.0–100.0)
Platelets: 204 10*3/uL (ref 150–400)
RBC: 4.18 MIL/uL (ref 3.87–5.11)
RDW: 14.4 % (ref 11.5–15.5)
WBC: 3.6 10*3/uL — ABNORMAL LOW (ref 4.0–10.5)
nRBC: 0 % (ref 0.0–0.2)

## 2021-04-07 LAB — TROPONIN I (HIGH SENSITIVITY): Troponin I (High Sensitivity): 2 ng/L (ref <18)

## 2021-04-07 MED ORDER — HYDROXYZINE HCL 25 MG PO TABS
25.0000 mg | ORAL_TABLET | Freq: Once | ORAL | Status: AC
  Administered 2021-04-07: 25 mg via ORAL
  Filled 2021-04-07: qty 1

## 2021-04-07 NOTE — Telephone Encounter (Signed)
Call transferred from front office. Pt stated she went to the ED for panic attack last week (7/1) and was told to f/u with her doctor. Appt has been scheduled w/Dr Ruben Im on 7/19.  Pt stated she had a mild attack last night. Stated had missed 2 doses of Paxil prior going to the the ED but she has been taking it as prescribed since then.  Stated she has stopped drinking coffee since it can trigger an attack.ED had prescribed Hydroxyzine but she refuse to take b/c she stated she's already on paxil and methadone and she does not want to add another med. Informed pt to try relaxation techniques ie yoga, deep breathing. She also wants to know if she should continue taking paxil daily or as needed - told her I will ask her doctor.  Informed pt to keep the appt next week and her doctor will be able to discuss this with her further.

## 2021-04-07 NOTE — ED Notes (Signed)
Called for vitals and no response 

## 2021-04-07 NOTE — Telephone Encounter (Addendum)
Called pt - informed her to take Paxil daily as prescribed to prevent withdrawal symptoms and for better control of her anxiety. She stated she knows this. Stated she's currently at the ED but she stated the wait time is long. Informed they can be able to evaluate her since she's there; if she cannot wait, go to UC - stated she will. Also informed to keep the appt next week (no available appts this week).

## 2021-04-07 NOTE — ED Notes (Signed)
Called 2x for vitals 

## 2021-04-07 NOTE — Telephone Encounter (Addendum)
We had an appt cancellation on Friday.  Called pt several times to inform her; pt finally answered. Stated she's in the ED; "I'm trying to calm down". Pt informed her appt is Friday 7/15 @ 1015 AM w/Dr August Saucer.

## 2021-04-07 NOTE — ED Provider Notes (Signed)
[facility] EMERGENCY DEPARTMENT Provider Note   CSN: [id number] Arrival date & time: 04/07/21  1127     History Chief Complaint  Patient presents with   Panic Attack    Mercedez Boule is a 55 y.o. female with a past medical history of anxiety, depression presenting to the ED with a chief complaint of anxiety.  Reports panic attack that began today.  She has had this in the past.  She is currently on Paxil and feels like it does help her but did not take it today.  This was for unknown reason.  She was seen about 2 weeks ago for similar symptoms.  She was given a dose of hydroxyzine at the time which helped her symptoms and was sent home with the same.  However she is not taking it because she is concerned about being on another medication.  She also drank a cup of coffee yesterday and she is unsure if this is causing it.  She reports stress related to moving and finding a new job but denies any SI, HI or AVH.  She is concerned because she is having palpitations and feeling jittery which is typically how her panic attacks happen.  Denies any chest pain.  States that she will need to calm herself down by eating.  HPI     Past Medical History:  Diagnosis Date   Anemia    IDA   Anxiety    Depression    Hernia of abdominal wall    History of drug abuse (HCC)    has not used in ten years   Substance abuse (HCC)    2012 QUIT Heroin   Transfusion history    due to anemia with hb 4.0    Patient Active Problem List   Diagnosis Date Noted   Chronic pain of both knees 10/23/2020   Anemia    Tobacco user 06/16/2020   Heavy menstrual bleeding 06/16/2020   Anxiety and depression 06/16/2020   Iron deficiency anemia due to chronic blood loss 06/16/2020   Healthcare maintenance 06/16/2020   Ventral hernia 04/14/2019   Abnormal uterine bleeding 01/25/2018   Other abnormal glucose 05/05/2014   Morbid obesity (HCC) 03/27/2014   Opioid dependence (HCC) 03/27/2014   Panic  attack 03/17/2011    Past Surgical History:  Procedure Laterality Date   INSERTION OF MESH N/A 04/19/2019   Procedure: INSERTION OF MESH;  Surgeon: Darnell Level, MD;  Location: [facility];  Service: General;  Laterality: N/A;   SALPINGECTOMY Left    due to ruptured ectopic   VENTRAL HERNIA REPAIR N/A 04/19/2019   Procedure: VENTRAL HERNIA REPAIR;  Surgeon: Darnell Level, MD;  Location: [facility];  Service: General;  Laterality: N/A;     OB History     Gravida  10   Para  5   Term  5   Preterm  0   AB  5   Living  4      SAB  2   IAB  3   Ectopic  0   Multiple  0   Live Births  5           Family History  Problem Relation Age of Onset   Cancer Mother    Cancer Father    Colon cancer Neg Hx    Colon polyps Neg Hx    Esophageal cancer Neg Hx    Rectal cancer Neg Hx    Stomach cancer Neg  Hx     Social History   Tobacco Use   Smoking status: Every Day    Packs/day: 0.25    Pack years: 0.00    Types: Cigarettes   Smokeless tobacco: Never   Tobacco comments:     2 cigarettes per day  Vaping Use   Vaping Use: Never used  Substance Use Topics   Alcohol use: Not Currently    Comment: 2012   Drug use: Not Currently    Types: Heroin, Marijuana    Comment: last time used drugs was 2012    Home Medications Prior to Admission medications   Medication Sig Start Date End Date Taking? Authorizing Provider  ferrous sulfate 325 (65 FE) MG tablet Take 1 tablet (325 mg total) by mouth daily with breakfast. Patient not taking: No sig reported 10/23/20   Lenward Chancellor D, DO  hydrOXYzine (ATARAX/VISTARIL) 25 MG tablet Take 1 tablet (25 mg total) by mouth every 8 (eight) hours as needed. 03/26/21   Charlynne Pander, MD  medroxyPROGESTERone (PROVERA) 10 MG tablet 10 mg PO daily for days 1-10 each month Patient not taking: No sig reported 11/23/20   Adam Phenix, MD  methadone (DOLOPHINE) 10 MG/5ML solution Take 37 mg by mouth  every morning.    [provider]  PARoxetine (PAXIL) 20 MG tablet TAKE 1 TABLET(20 MG) BY MOUTH EVERY EVENING Patient taking differently: Take 20 mg by mouth at bedtime. 02/15/21   Remo Lipps, MD  famotidine (PEPCID) 20 MG tablet Take 1 tablet (20 mg total) by mouth 2 (two) times daily. Patient not taking: Reported on 09/03/2019 07/28/19 03/10/20  Merrilee Jansky, MD    Allergies    Penicillins  Review of Systems   Review of Systems  Constitutional:  Negative for appetite change, chills and fever.  HENT:  Negative for ear pain, rhinorrhea, sneezing and sore throat.   Eyes:  Negative for photophobia and visual disturbance.  Respiratory:  Negative for cough, chest tightness, shortness of breath and wheezing.   Cardiovascular:  Positive for palpitations. Negative for chest pain.  Gastrointestinal:  Negative for abdominal pain, blood in stool, constipation, diarrhea, nausea and vomiting.  Genitourinary:  Negative for dysuria, hematuria and urgency.  Musculoskeletal:  Negative for myalgias.  Skin:  Negative for rash.  Neurological:  Negative for dizziness, weakness and light-headedness.  Psychiatric/Behavioral:  The patient is nervous/anxious.    Physical Exam Updated Vital Signs BP 130/69 (BP Location: Right Arm)   Pulse (!) 103   Temp 98.2 F (36.8 C) (Oral)   Resp 18   Ht 5\' 4"  (1.626 m)   Wt 98 kg   LMP 03/25/2021   SpO2 98%   BMI 37.08 kg/m   Physical Exam Vitals and nursing note reviewed.  Constitutional:      General: She is not in acute distress.    Appearance: She is well-developed.     Comments: Patient intermittently tearful.  She appears very obviously anxious as well.  HENT:     Head: Normocephalic and atraumatic.     Nose: Nose normal.  Eyes:     General: No scleral icterus.       Left eye: No discharge.     Conjunctiva/sclera: Conjunctivae normal.  Cardiovascular:     Rate and Rhythm: Normal rate and regular rhythm.     Heart sounds: Normal  heart sounds. No murmur heard.   No friction rub. No gallop.  Pulmonary:     Effort: Pulmonary effort is  normal. No respiratory distress.     Breath sounds: Normal breath sounds.  Abdominal:     General: Bowel sounds are normal. There is no distension.     Palpations: Abdomen is soft.     Tenderness: There is no abdominal tenderness. There is no guarding.  Musculoskeletal:        General: Normal range of motion.     Cervical back: Normal range of motion and neck supple.  Skin:    General: Skin is warm and dry.     Findings: No rash.  Neurological:     Mental Status: She is alert.     Motor: No abnormal muscle tone.     Coordination: Coordination normal.    ED Results / Procedures / Treatments   Labs (all labs ordered are listed, but only abnormal results are displayed) Labs Reviewed  BASIC METABOLIC PANEL - Abnormal; Notable for the following components:      Result Value   Sodium 133 (*)    Glucose, Bld 110 (*)    Calcium 8.8 (*)    All other components within normal limits  CBC - Abnormal; Notable for the following components:   WBC 3.6 (*)    Hemoglobin 11.1 (*)    HCT 35.0 (*)    All other components within normal limits  TROPONIN I (HIGH SENSITIVITY)    EKG None  Radiology DG Chest 2 View  Result Date: 04/07/2021 CLINICAL DATA:  Shortness of breath. EXAM: CHEST - 2 VIEW COMPARISON:  03/26/2021. FINDINGS: Similar mild enlargement of the cardiac silhouette. Both lungs are clear. No visible pleural effusions or pneumothorax. Similar eventration of the right hemidiaphragm. No acute osseous abnormality. No substantial change from the prior. IMPRESSION: 1. No evidence of acute cardiopulmonary disease. 2. Similar cardiomegaly. Electronically Signed   By: Feliberto Harts MD   On: 04/07/2021 12:24    Procedures Procedures   Medications Ordered in ED Medications  hydrOXYzine (ATARAX/VISTARIL) tablet 25 mg (25 mg Oral Given 04/07/21 1626)    ED Course  I have  reviewed the triage vital signs and the nursing notes.  Pertinent labs & imaging results that were available during my care of the patient were reviewed by me and considered in my medical decision making (see chart for details).    MDM Rules/Calculators/A&P                          55 year old female presenting to the ED for worsening anxiety.  She started having a panic attack today.  She takes Paxil on a daily basis and took it yesterday which helped her symptoms.  However she has not taken it today due to an unknown reason.  She was prescribed hydroxyzine by the ED about 2 weeks ago but has not been taking this.  When she took a dose of it in the ER she states that it did help.  She denies any SI, HI, AVH.  Patient on exam is very obviously anxious.  She would become intermittently tearful when speaking about her symptoms.  She denies chest pain, shortness of breath.  She is tachycardic here and has a history of the same.  I had a long discussion with the patient regarding ER approach to anxiety and panic attacks.  Reiterated to her that I understand the symptoms that she is feeling related to likely her anxiety.  Encouraged her to take the Paxil at this time as it has helped her in  the past.  I also encouraged her to have a discussion with the prescriber which is her PCP and informing them that if this medication is not helping in the long run, consider changes.  She is scheduled to see them in about 1 week and states that she will bring this up with them.  We will give Paxil and hydroxyzine here and reassess.  On recheck patient remains anxious but slightly improved.  She continues to deny SI or HI and does not appear to be hallucinating.  I feel that she will mostly benefit from outpatient follow-up.  We will refer her to outpatient therapy as well as I feel this in conjunction medication will help her.  Patient is agreeable to the plan.  Return precautions given.   Patient is hemodynamically  stable, in NAD, and able to ambulate in the ED. Evaluation does not show pathology that would require ongoing emergent intervention or inpatient treatment. I explained the diagnosis to the patient. Pain has been managed and has no complaints prior to discharge. Patient is comfortable with above plan and is stable for discharge at this time. All questions were answered prior to disposition. Strict return precautions for returning to the ED were discussed. Encouraged follow up with PCP.   An After Visit Summary was printed and given to the patient.   Portions of this note were generated with Scientist, clinical (histocompatibility and immunogenetics)Dragon dictation software. Dictation errors may occur despite best attempts at proofreading.  Final Clinical Impression(s) / ED Diagnoses Final diagnoses:  Anxiety    Rx / DC Orders ED Discharge Orders     None        Dietrich PatesKhatri, Ashtan Laton, PA-C 04/07/21 1705    Milagros Lollykstra, Richard S, MD 04/09/21 1147

## 2021-04-07 NOTE — ED Notes (Signed)
Pt back in lobby.

## 2021-04-07 NOTE — ED Notes (Signed)
Called for vitals, no response ?

## 2021-04-07 NOTE — Discharge Instructions (Addendum)
Continue taking your Paxil.  You can also take the hydroxyzine prescribed as needed. Follow-up with your primary care provider and use the resource guide for counseling/therapy. Return to the ER if you start to experience chest pain, shortness of breath, leg swelling, vomiting.

## 2021-04-07 NOTE — Telephone Encounter (Signed)
Paroxetine should be taken daily as prescribed, if taken intermittently it will not lead to adequate control of her anxiety. It can also lead to withdrawal symptoms.

## 2021-04-07 NOTE — ED Notes (Signed)
Pt went to cafeteria. Notified pt that we do not advise pts to eat before seeing the provider, and explained to pt that if we are unable to reach her we will have to go to the next person. Pt stated she understood.

## 2021-04-07 NOTE — ED Triage Notes (Signed)
Pt reports feeling of anxiety ongoing for several days, exacerbated today. Endorses having palpitations and SOB.

## 2021-04-09 ENCOUNTER — Encounter: Payer: Self-pay | Admitting: Internal Medicine

## 2021-04-09 ENCOUNTER — Ambulatory Visit (INDEPENDENT_AMBULATORY_CARE_PROVIDER_SITE_OTHER): Payer: Medicare Other | Admitting: Internal Medicine

## 2021-04-09 ENCOUNTER — Other Ambulatory Visit: Payer: Self-pay

## 2021-04-09 VITALS — BP 129/81 | HR 91 | Temp 98.2°F | Ht 64.0 in | Wt 246.6 lb

## 2021-04-09 DIAGNOSIS — F419 Anxiety disorder, unspecified: Secondary | ICD-10-CM

## 2021-04-09 NOTE — Progress Notes (Signed)
   CC: panic attacks  HPI:  Ms.Rasheeda Tauzin is a 55 y.o. female with a past medical history as noted below who presents for follow-up regarding anxiety and increased panic attacks for which she has been seen in the ED twice this month.  The patient states that she feels that alterations to her methadone could have led to increased symptoms of anxiety and ultimately a panic attack but is not sure. She is taking paroxetine at night but does admit to increased anxious feelings during the daytime. Paroxetine makes her feel relaxed and she denies trouble sleeping with the medication. She has also taken hydroxyzine twice for further assistance relaxing and sleeping. She does endorse some GI discomfort at this time related to anxiety and occasional palpitations but states that it is nowhere near as severe as when she was evaluated for panic attack.  She reports overwhelming fatigue following her panic attacks and wonders if her paroxetine may be a part of the problem. She does not see a psychiatrist at this time but wishes to have better control over her anxiety and depression, and decrease occurrence of panic attacks.  Past Medical History:  Diagnosis Date   Anemia    IDA   Anxiety    Depression    Hernia of abdominal wall    History of drug abuse (HCC)    has not used in ten years   Substance abuse (HCC)    2012 QUIT Heroin   Transfusion history    due to anemia with hb 4.0   Review of Systems:  Review of Systems  Constitutional:  Positive for malaise/fatigue.  Respiratory:  Negative for shortness of breath.   Cardiovascular:  Positive for palpitations.  Gastrointestinal:  Positive for abdominal pain and diarrhea.  Neurological:  Negative for dizziness, tingling and headaches.  Psychiatric/Behavioral:  Positive for depression. Negative for substance abuse and suicidal ideas. The patient is nervous/anxious. The patient does not have insomnia.     Physical Exam:  Vitals:   04/09/21 1005   BP: 129/81  Pulse: 91  Temp: 98.2 F (36.8 C)  TempSrc: Oral  SpO2: 99%  Weight: 246 lb 9.6 oz (111.9 kg)  Height: 5\' 4"  (1.626 m)   Physical Exam Constitutional:      Appearance: Normal appearance.  Cardiovascular:     Rate and Rhythm: Normal rate and regular rhythm.     Heart sounds: Normal heart sounds.  Pulmonary:     Effort: Pulmonary effort is normal.     Breath sounds: Normal breath sounds.  Skin:    General: Skin is warm and dry.  Neurological:     Mental Status: She is alert.  Psychiatric:        Attention and Perception: Attention and perception normal.        Mood and Affect: Mood is anxious. Affect is tearful.        Speech: Speech normal.        Behavior: Behavior normal. Behavior is cooperative.        Thought Content: Thought content normal.        Cognition and Memory: Cognition normal.     Assessment & Plan:   See Encounters Tab for problem based charting.  Patient seen with Dr. 

## 2021-04-09 NOTE — Assessment & Plan Note (Signed)
Patient complaining of worsening anxiety and associated panic attacks. She also reports increased fatigue. I have advised the patient to try taking her paroxetine in the morning rather than in the evening. I have advised her that it is safe to take hydroxyzine prescribed to her in the ED as needed. Referral for psychiatry placed for further management.

## 2021-04-09 NOTE — Patient Instructions (Signed)
Thank you so much for visiting the [facility] today. It was a pleasure to meet you!  Today we discussed your recent panic attacks and increased anxiety. We discussed how tired you have been feeling recently as well. I would like you to try taking your paroxetine in the morning instead of the evening to see how that makes you feel. You can start by taking today's dose in the afternoon and tomorrow's dose in the morning, then continue taking it in the morning.  Hydroxyzine was given to you in the emergency room and you can continue to take that as needed. It does not have addictive potential.  I am also placing a referral for you today for psychiatry. They will reach out to you to set up an appointment.  If you have any questions or concerns, please call our clinic at [phone] between 9am-5pm and after hours call [phone] and ask for the internal medicine resident on call. If you feel you are having a medical emergency please call 911.  Dr. August Saucer

## 2021-04-10 ENCOUNTER — Emergency Department (HOSPITAL_COMMUNITY)
Admission: EM | Admit: 2021-04-10 | Discharge: 2021-04-10 | Disposition: A | Discharge status: Home / Self Care | Payer: Medicare Other | Attending: Emergency Medicine | Admitting: Emergency Medicine

## 2021-04-10 ENCOUNTER — Other Ambulatory Visit: Payer: Self-pay

## 2021-04-10 ENCOUNTER — Encounter (HOSPITAL_COMMUNITY): Payer: Self-pay | Admitting: Emergency Medicine

## 2021-04-10 DIAGNOSIS — F17210 Nicotine dependence, cigarettes, uncomplicated: Secondary | ICD-10-CM | POA: Insufficient documentation

## 2021-04-10 DIAGNOSIS — F419 Anxiety disorder, unspecified: Secondary | ICD-10-CM

## 2021-04-10 DIAGNOSIS — F410 Panic disorder [episodic paroxysmal anxiety] without agoraphobia: Secondary | ICD-10-CM | POA: Insufficient documentation

## 2021-04-10 LAB — D-DIMER, QUANTITATIVE: D-Dimer, Quant: 0.31 ug/mL-FEU (ref 0.00–0.50)

## 2021-04-10 LAB — CBC WITH DIFFERENTIAL/PLATELET
Abs Immature Granulocytes: 0.01 10*3/uL (ref 0.00–0.07)
Basophils Absolute: 0 10*3/uL (ref 0.0–0.1)
Basophils Relative: 1 %
Eosinophils Absolute: 0.1 10*3/uL (ref 0.0–0.5)
Eosinophils Relative: 1 %
HCT: 35 % — ABNORMAL LOW (ref 36.0–46.0)
Hemoglobin: 11.1 g/dL — ABNORMAL LOW (ref 12.0–15.0)
Immature Granulocytes: 0 %
Lymphocytes Relative: 21 %
Lymphs Abs: 1 10*3/uL (ref 0.7–4.0)
MCH: 26.3 pg (ref 26.0–34.0)
MCHC: 31.7 g/dL (ref 30.0–36.0)
MCV: 82.9 fL (ref 80.0–100.0)
Monocytes Absolute: 0.2 10*3/uL (ref 0.1–1.0)
Monocytes Relative: 5 %
Neutro Abs: 3.5 10*3/uL (ref 1.7–7.7)
Neutrophils Relative %: 72 %
Platelets: 214 10*3/uL (ref 150–400)
RBC: 4.22 MIL/uL (ref 3.87–5.11)
RDW: 14.1 % (ref 11.5–15.5)
WBC: 4.8 10*3/uL (ref 4.0–10.5)
nRBC: 0 % (ref 0.0–0.2)

## 2021-04-10 LAB — BASIC METABOLIC PANEL
Anion gap: 7 (ref 5–15)
BUN: 13 mg/dL (ref 6–20)
CO2: 23 mmol/L (ref 22–32)
Calcium: 8.8 mg/dL — ABNORMAL LOW (ref 8.9–10.3)
Chloride: 105 mmol/L (ref 98–111)
Creatinine, Ser: 0.83 mg/dL (ref 0.44–1.00)
GFR, Estimated: >60 mL/min (ref >60)
Glucose, Bld: 107 mg/dL — ABNORMAL HIGH (ref 70–99)
Potassium: 3.6 mmol/L (ref 3.5–5.1)
Sodium: 135 mmol/L (ref 135–145)

## 2021-04-10 LAB — MAGNESIUM: Magnesium: 1.8 mg/dL (ref 1.7–2.4)

## 2021-04-10 LAB — TROPONIN I (HIGH SENSITIVITY): Troponin I (High Sensitivity): <2 ng/L (ref <18)

## 2021-04-10 LAB — TSH: TSH: 0.986 u[IU]/mL (ref 0.350–4.500)

## 2021-04-10 LAB — T4, FREE: Free T4: 0.72 ng/dL (ref 0.61–1.12)

## 2021-04-10 MED ORDER — HYDROXYZINE HCL 25 MG PO TABS: 25.0000 mg | ORAL_TABLET | Freq: Three times a day (TID) | ORAL | 0 refills | Status: DC | PRN

## 2021-04-10 NOTE — ED Triage Notes (Signed)
Pt from home complaint of anxiety attack today, patient has had several last few days. VSS.

## 2021-04-10 NOTE — ED Notes (Signed)
Pt d/c home per MD order. Discharge summary reviewed with pt, pt verbalizes understanding. No s/s of acute distress noted at discharge. Ambulatory.  

## 2021-04-10 NOTE — Discharge Instructions (Addendum)
Follow up with Psychiatry  Return for new or worsening symptoms

## 2021-04-10 NOTE — ED Provider Notes (Signed)
[facility] EMERGENCY DEPARTMENT Provider Note   CSN: [id number] Arrival date & time: 04/10/21  1148     History Chief Complaint  Patient presents with   Panic Attack    Paula Leon is a 55 y.o. female with past medical history significant for by substance use, depression, anxiety who presents for evaluation of panic attack.  Patient states this is the fifth time she has been seen for anxiety over the last week and a half.  States she had changed how she was taking her paroxetine.  She is not followed by psychiatry.  She denies any SI, HI, AVH.  Patient states she will get palpitations, become short of breath and then get anxious.  States that time she feels like her throat is closing and she cannot get her thoughts together.  States at that time she gets chest tightness.  Does not radiate.  She has no exertional chest pain at baseline.  No dyspnea on exertion.  Was seen here a few days ago and given hydroxyzine.  Patient states that this is helped however she does not want to take this regularly as she does not want to "become addicted."  She is on methadone at home.  No fever, chills, cough, nausea, vomiting, abdominal pain, back pain, paresthesias or weakness denies additional aggravating or alleviating factors.  History obtained from patient and past medical records.  No interpreter used.  HPI     Past Medical History:  Diagnosis Date   Anemia    IDA   Anxiety    Depression    Hernia of abdominal wall    History of drug abuse (HCC)    has not used in ten years   Substance abuse (HCC)    2012 QUIT Heroin   Transfusion history    due to anemia with hb 4.0    Patient Active Problem List   Diagnosis Date Noted   Chronic pain of both knees 10/23/2020   Anemia    Tobacco user 06/16/2020   Heavy menstrual bleeding 06/16/2020   Anxiety 06/16/2020   Iron deficiency anemia due to chronic blood loss 06/16/2020   Healthcare maintenance 06/16/2020   Ventral  hernia 04/14/2019   Abnormal uterine bleeding 01/25/2018   Other abnormal glucose 05/05/2014   Morbid obesity (HCC) 03/27/2014   Opioid dependence (HCC) 03/27/2014   Panic attack 03/17/2011    Past Surgical History:  Procedure Laterality Date   INSERTION OF MESH N/A 04/19/2019   Procedure: INSERTION OF MESH;  Surgeon: Darnell Level, MD;  Location: [facility];  Service: General;  Laterality: N/A;   SALPINGECTOMY Left    due to ruptured ectopic   VENTRAL HERNIA REPAIR N/A 04/19/2019   Procedure: VENTRAL HERNIA REPAIR;  Surgeon: Darnell Level, MD;  Location: [facility];  Service: General;  Laterality: N/A;     OB History     Gravida  10   Para  5   Term  5   Preterm  0   AB  5   Living  4      SAB  2   IAB  3   Ectopic  0   Multiple  0   Live Births  5           Family History  Problem Relation Age of Onset   Cancer Mother    Cancer Father    Colon cancer Neg Hx    Colon polyps Neg Hx    Esophageal  cancer Neg Hx    Rectal cancer Neg Hx    Stomach cancer Neg Hx     Social History   Tobacco Use   Smoking status: Every Day    Packs/day: 0.25    Types: Cigarettes   Smokeless tobacco: Never   Tobacco comments:     2 cigarettes per day  Vaping Use   Vaping Use: Never used  Substance Use Topics   Alcohol use: Not Currently    Comment: 2012   Drug use: Not Currently    Types: Heroin, Marijuana    Comment: last time used drugs was 2012    Home Medications Prior to Admission medications   Medication Sig Start Date End Date Taking? Authorizing Provider  hydrOXYzine (ATARAX/VISTARIL) 25 MG tablet Take 1 tablet (25 mg total) by mouth every 8 (eight) hours as needed. 04/10/21  Yes Godson Pollan A, PA-C  ferrous sulfate 325 (65 FE) MG tablet Take 1 tablet (325 mg total) by mouth daily with breakfast. Patient not taking: No sig reported 10/23/20   Lenward Chancellor D, DO  medroxyPROGESTERone (PROVERA) 10 MG tablet 10 mg PO  daily for days 1-10 each month Patient not taking: No sig reported 11/23/20   Adam Phenix, MD  methadone (DOLOPHINE) 10 MG/5ML solution Take 37 mg by mouth every morning.    [provider]  PARoxetine (PAXIL) 20 MG tablet TAKE 1 TABLET(20 MG) BY MOUTH EVERY EVENING Patient taking differently: Take 20 mg by mouth at bedtime. 02/15/21   Remo Lipps, MD  famotidine (PEPCID) 20 MG tablet Take 1 tablet (20 mg total) by mouth 2 (two) times daily. Patient not taking: Reported on 09/03/2019 07/28/19 03/10/20  Merrilee Jansky, MD    Allergies    Penicillins  Review of Systems   Review of Systems  Constitutional: Negative.   HENT: Negative.    Respiratory:  Positive for chest tightness and shortness of breath. Negative for apnea, wheezing and stridor.   Cardiovascular:  Positive for palpitations. Negative for chest pain and leg swelling.  Gastrointestinal: Negative.   Genitourinary: Negative.   Musculoskeletal: Negative.   Skin: Negative.   Neurological: Negative.   All other systems reviewed and are negative.  Physical Exam Updated Vital Signs BP (!) 136/102   Pulse (!) 119   Temp 99.5 F (37.5 C) (Oral)   Resp 18   LMP 03/25/2021   SpO2 97%   Physical Exam Vitals and nursing note reviewed.  Constitutional:      General: She is not in acute distress.    Appearance: She is well-developed. She is not ill-appearing, toxic-appearing or diaphoretic.  HENT:     Head: Normocephalic and atraumatic.     Nose: Nose normal.     Mouth/Throat:     Mouth: Mucous membranes are moist.  Eyes:     Pupils: Pupils are equal, round, and reactive to light.  Cardiovascular:     Rate and Rhythm: Normal rate.     Pulses: Normal pulses.     Heart sounds: Normal heart sounds.     Comments: Equal rise and fall to chest wall, no murmur Pulmonary:     Effort: Pulmonary effort is normal. No respiratory distress.     Breath sounds: Normal breath sounds.     Comments: Clear to auscultation  bilaterally Abdominal:     General: Bowel sounds are normal. There is no distension.     Palpations: Abdomen is soft.     Comments: Soft, nontender  Musculoskeletal:        General: Normal range of motion.     Cervical back: Normal range of motion.     Comments: No bony tenderness.  Compartments soft.  Moves all 4 extremities without difficulty  Skin:    General: Skin is warm and dry.     Capillary Refill: Capillary refill takes less than 2 seconds.     Comments: No rash or lesion  Neurological:     General: No focal deficit present.     Mental Status: She is alert and oriented to person, place, and time.  Psychiatric:        Mood and Affect: Mood normal.        Behavior: Behavior normal.        Thought Content: Thought content normal.        Judgment: Judgment normal.     Comments:  Denies SI, HI, AVH    ED Results / Procedures / Treatments   Labs (all labs ordered are listed, but only abnormal results are displayed) Labs Reviewed  CBC WITH DIFFERENTIAL/PLATELET - Abnormal; Notable for the following components:      Result Value   Hemoglobin 11.1 (*)    HCT 35.0 (*)    All other components within normal limits  BASIC METABOLIC PANEL - Abnormal; Notable for the following components:   Glucose, Bld 107 (*)    Calcium 8.8 (*)    All other components within normal limits  D-DIMER, QUANTITATIVE  T4, FREE  MAGNESIUM  TSH  TROPONIN I (HIGH SENSITIVITY)  TROPONIN I (HIGH SENSITIVITY)   EKG None  Radiology No results found.  Procedures Procedures   Medications Ordered in ED Medications - No data to display  ED Course  I have reviewed the triage vital signs and the nursing notes.  Pertinent labs & imaging results that were available during my care of the patient were reviewed by me and considered in my medical decision making (see chart for details).  Here for evaluation of panic attacks. This is patients 5th visit for similar complaints over the last 2 weeks.  Afebrile, non septic, non ill appearing.  Plan on broadening workup to ensure no additional cause of her panic attacks.   Workup without significant abnormality, normal Trop, ddimer, TSH, electrolytes, Mag.   Discussed results with patient. Given resources for outpatient FU with Psych, Discussed taking her home Hydroxyzine over a scheduled basis q 8 hours over the next few days to break cycle of panic attacks. She will return for new or worsening symptoms  The patient has been appropriately medically screened and/or stabilized in the ED. I have low suspicion for any other emergent medical condition which would require further screening, evaluation or treatment in the ED or require inpatient management.  Patient is hemodynamically stable and in no acute distress.  Patient able to ambulate in department prior to ED.  Evaluation does not show acute pathology that would require ongoing or additional emergent interventions while in the emergency department or further inpatient treatment.  I have discussed the diagnosis with the patient and answered all questions.  Pain is been managed while in the emergency department and patient has no further complaints prior to discharge.  Patient is comfortable with plan discussed in room and is stable for discharge at this time.  I have discussed strict return precautions for returning to the emergency department.  Patient was encouraged to follow-up with PCP/specialist refer to at discharge.     MDM Rules/Calculators/A&P  Final Clinical Impression(s) / ED Diagnoses Final diagnoses:  Anxiety disorder, unspecified type    Rx / DC Orders ED Discharge Orders          Ordered    hydrOXYzine (ATARAX/VISTARIL) 25 MG tablet  Every 8 hours PRN        04/10/21 1512             Hildred Pharo A, PA-C 04/10/21 1524    Gerhard Munch, MD 04/10/21 1628

## 2021-04-13 ENCOUNTER — Encounter: Payer: Medicare Other | Admitting: Internal Medicine

## 2021-04-19 ENCOUNTER — Encounter: Payer: Medicare Other | Admitting: Internal Medicine

## 2021-04-19 NOTE — Progress Notes (Signed)
Internal Medicine Clinic Attending  I saw and evaluated the patient.  I personally confirmed the key portions of the history and exam documented by Dr.  Dean  and I reviewed pertinent patient test results.  The assessment, diagnosis, and plan were formulated together and I agree with the documentation in the resident's note.  

## 2021-04-23 ENCOUNTER — Ambulatory Visit (INDEPENDENT_AMBULATORY_CARE_PROVIDER_SITE_OTHER): Payer: Medicare Other | Admitting: Internal Medicine

## 2021-04-23 ENCOUNTER — Other Ambulatory Visit: Payer: Self-pay

## 2021-04-23 ENCOUNTER — Encounter: Payer: Self-pay | Admitting: Internal Medicine

## 2021-04-23 VITALS — BP 149/72 | HR 98 | Temp 98.7°F | Ht 64.0 in | Wt 240.8 lb

## 2021-04-23 DIAGNOSIS — M25562 Pain in left knee: Secondary | ICD-10-CM

## 2021-04-23 DIAGNOSIS — Z0000 Encounter for general adult medical examination without abnormal findings: Secondary | ICD-10-CM

## 2021-04-23 DIAGNOSIS — M25561 Pain in right knee: Secondary | ICD-10-CM | POA: Diagnosis not present

## 2021-04-23 DIAGNOSIS — F410 Panic disorder [episodic paroxysmal anxiety] without agoraphobia: Secondary | ICD-10-CM | POA: Diagnosis not present

## 2021-04-23 DIAGNOSIS — G8929 Other chronic pain: Secondary | ICD-10-CM | POA: Diagnosis not present

## 2021-04-23 MED ORDER — DICLOFENAC SODIUM 1 % EX GEL: 4.0000 g | Freq: Four times a day (QID) | CUTANEOUS | 1 refills | Status: DC

## 2021-04-23 MED ORDER — HYDROXYZINE HCL 25 MG PO TABS: 25.0000 mg | ORAL_TABLET | Freq: Three times a day (TID) | ORAL | 0 refills | Status: AC | PRN

## 2021-04-23 MED ORDER — HYDROXYZINE HCL 25 MG PO TABS: 25.0000 mg | ORAL_TABLET | Freq: Three times a day (TID) | ORAL | 0 refills | Status: DC | PRN

## 2021-04-23 MED ORDER — HYDROXYZINE HCL 25 MG PO TABS: 50.0000 mg | ORAL_TABLET | Freq: Four times a day (QID) | ORAL | 0 refills | Status: DC | PRN

## 2021-04-23 NOTE — Assessment & Plan Note (Addendum)
Panic attack-Third episode this month prompting Paula Leon to present to the ED. Symptoms include chest pain, chest tightness, SOB and feelings of anxiousness.  Labs and EKG ordered at ED were reassuring. Cardiogenic causes ruled out. Paula Leon is currently taking paroxetine 20mg  daily at bedtime and hydroxyzine 25mg  Q8 PRN.  She states her current dosing and regimen is working for her. She has been referred to Psychiatry in th past and plans to follow up with scheduling an appointment.  PLAN: Continue current medication regimen Expected to follow-up with psychiatry. Follow-up in 3 months.

## 2021-04-23 NOTE — Patient Instructions (Signed)
Use Voltaren gel up to 4 times a day for knee pain  Continue current medications are prescribed  Check with front desk about psychiatry appt  Will check BP again next visit  Follow up in 3 months

## 2021-04-23 NOTE — Assessment & Plan Note (Signed)
Paula Leon expresses some personal home issues with her son and transportation.   PLAN: Referral to social work.

## 2021-04-23 NOTE — Progress Notes (Signed)
CC: HFU- panic attack  HPI:  Paula Leon is a 55 y.o. female with a past medical history stated below and presents today for hospital follow up for panic attack.  Paula Leon presented to the ED 04/10/2021 with symptoms of a panic attack.  This is her third time this month presenting to the ED with similar symptoms.  Her symptoms include chest pain/chest tightness/shortness of breath and palpitations.  Labs in the ED were reassuring.  Cardiogenic chest pain ruled out.  Today Paula Leon states that she feels fine.  She denies chest pain, shortness of breath, chest tightness, or anxiousness at this time.  She reports left knee pain that began 2 days ago.  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  Her pain is worse at the end of the workday.  Denies any recent falls.   Please see problem based assessment and plan for additional details.  Past Medical History:  Diagnosis Date   Anemia    IDA   Anxiety    Depression    Hernia of abdominal wall    History of drug abuse (HCC)    has not used in ten years   Substance abuse (HCC)    2012 QUIT Heroin   Transfusion history    due to anemia with hb 4.0    Current Outpatient Medications on File Prior to Visit  Medication Sig Dispense Refill   ferrous sulfate 325 (65 FE) MG tablet Take 1 tablet (325 mg total) by mouth daily with breakfast. (Patient not taking: No sig reported) 90 tablet 0   hydrOXYzine (ATARAX/VISTARIL) 25 MG tablet Take 1 tablet (25 mg total) by mouth every 8 (eight) hours as needed. 12 tablet 0   medroxyPROGESTERone (PROVERA) 10 MG tablet 10 mg PO daily for days 1-10 each month (Patient not taking: No sig reported) 30 tablet 2   methadone (DOLOPHINE) 10 MG/5ML solution Take 37 mg by mouth every morning.     PARoxetine (PAXIL) 20 MG tablet TAKE 1 TABLET(20 MG) BY MOUTH EVERY EVENING (Patient taking differently: Take 20 mg by mouth at bedtime.)  90 tablet 1   [DISCONTINUED] famotidine (PEPCID) 20 MG tablet Take 1 tablet (20 mg total) by mouth 2 (two) times daily. (Patient not taking: Reported on 09/03/2019) 30 tablet 0   No current facility-administered medications on file prior to visit.    Family History  Problem Relation Age of Onset   Cancer Mother    Cancer Father    Colon cancer Neg Hx    Colon polyps Neg Hx    Esophageal cancer Neg Hx    Rectal cancer Neg Hx    Stomach cancer Neg Hx     Social History   Socioeconomic History   Marital status: Single    Spouse name: Not on file   Number of children: Not on file   Years of education: Not on file   Highest education level: Not on file  Occupational History   Not on file  Tobacco Use   Smoking status: Every Day    Packs/day: 0.25    Types: Cigarettes   Smokeless tobacco: Never   Tobacco comments:     2 cigarettes per day  Vaping Use   Vaping Use: Never used  Substance and Sexual Activity   Alcohol use: Not Currently    Comment: 2012   Drug use: Not Currently  Types: Heroin, Marijuana    Comment: last time used drugs was 2012   Sexual activity: Not Currently    Birth control/protection: Surgical  Other Topics Concern   Not on file  Social History Narrative   Not on file   Social Determinants of Health   Financial Resource Strain: Not on file  Food Insecurity: Food Insecurity Present   Worried About Running Out of Food in the Last Year: Sometimes true   Ran Out of Food in the Last Year: Sometimes true  Transportation Needs: No Transportation Needs   Lack of Transportation (Medical): No   Lack of Transportation (Non-Medical): No  Physical Activity: Not on file  Stress: Not on file  Social Connections: Not on file  Intimate Partner Violence: Not on file    Review of Systems  Constitutional:  Negative for chills and fever.  Eyes:  Negative for blurred vision and double vision.  Respiratory:  Negative for shortness of breath and wheezing.    Cardiovascular:  Negative for chest pain, palpitations and leg swelling.  Gastrointestinal:  Negative for abdominal pain, constipation, diarrhea, heartburn, nausea and vomiting.  Genitourinary:  Negative for dysuria, frequency and urgency.  Musculoskeletal:  Positive for joint pain (left knee pain). Negative for falls.  Neurological:  Negative for dizziness and headaches.  There were no vitals filed for this visit.   Physical Exam HENT:     Head: Normocephalic and atraumatic.  Cardiovascular:     Rate and Rhythm: Normal rate and regular rhythm.     Heart sounds: Normal heart sounds, S1 normal and S2 normal.  Pulmonary:     Effort: Pulmonary effort is normal.     Breath sounds: Normal breath sounds and air entry.  Abdominal:     Palpations: Abdomen is soft.  Musculoskeletal:     Left knee: No swelling, deformity, effusion, erythema, bony tenderness or crepitus. Normal range of motion.     Comments: Left knee pain elicited with flexion and extension  Skin:    General: Skin is warm and dry.  Neurological:     General: No focal deficit present.     Mental Status: She is alert.  Psychiatric:        Attention and Perception: Attention normal.        Mood and Affect: Mood normal.        Behavior: Behavior normal. Behavior is cooperative.      Assessment & Plan:   See Encounters Tab for problem based charting.  Patient seen with Dr. Marylene Buerger, M.D. [facility] Internal Medicine, PGY-1 Pager: [phone], Phone: [phone] Date 04/23/2021 Time 10:13 AM

## 2021-04-23 NOTE — Assessment & Plan Note (Signed)
Ms. Buntin reports a left pain in the left knee that has been occurring for the past 2 days.  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  She reports that is when the pain is worse at work.  Does not report pain in the right knee.  X-ray of the left knee in 2021 reveals mild degenerative changes.  PLAN: Prescribed Voltaren gel for pain relief. Recommended to use knee brace while at work. Work note written for job restriction per patient request.

## 2021-04-26 ENCOUNTER — Telehealth: Payer: Self-pay | Admitting: Behavioral Health

## 2021-04-26 NOTE — Telephone Encounter (Signed)
Contacted Pt @ 1:00pm today & lft msg to RTC to Clinician for coping skills/tools & futher Referral information.  Dr. Monna Fam

## 2021-04-29 ENCOUNTER — Encounter: Payer: Self-pay | Admitting: Behavioral Health

## 2021-05-13 ENCOUNTER — Telehealth: Payer: Self-pay | Admitting: Behavioral Health

## 2021-05-13 NOTE — Telephone Encounter (Signed)
Contacted Pt w/notification she called [facility].  Lft msg for Pt to [facility] since she is likely @ work this morning.  Dr. Monna Fam

## 2021-05-29 ENCOUNTER — Telehealth: Payer: Self-pay

## 2021-05-29 NOTE — Telephone Encounter (Signed)
Left msg to call office to schedule [facility] AWV at her earliest convenience.  

## 2021-06-11 ENCOUNTER — Encounter: Payer: Self-pay | Admitting: Internal Medicine

## 2021-06-11 ENCOUNTER — Other Ambulatory Visit: Payer: Self-pay

## 2021-06-11 ENCOUNTER — Ambulatory Visit (INDEPENDENT_AMBULATORY_CARE_PROVIDER_SITE_OTHER): Payer: Medicare Other | Admitting: Internal Medicine

## 2021-06-11 VITALS — BP 129/81 | HR 93 | Temp 98.9°F | Resp 24 | Ht 64.0 in | Wt 236.7 lb

## 2021-06-11 DIAGNOSIS — Z111 Encounter for screening for respiratory tuberculosis: Secondary | ICD-10-CM | POA: Diagnosis not present

## 2021-06-11 DIAGNOSIS — G5603 Carpal tunnel syndrome, bilateral upper limbs: Secondary | ICD-10-CM

## 2021-06-11 DIAGNOSIS — D500 Iron deficiency anemia secondary to blood loss (chronic): Secondary | ICD-10-CM

## 2021-06-11 DIAGNOSIS — M25562 Pain in left knee: Secondary | ICD-10-CM

## 2021-06-11 DIAGNOSIS — F419 Anxiety disorder, unspecified: Secondary | ICD-10-CM | POA: Diagnosis not present

## 2021-06-11 DIAGNOSIS — M25561 Pain in right knee: Secondary | ICD-10-CM | POA: Diagnosis not present

## 2021-06-11 DIAGNOSIS — M222X2 Patellofemoral disorders, left knee: Secondary | ICD-10-CM

## 2021-06-11 DIAGNOSIS — Z0000 Encounter for general adult medical examination without abnormal findings: Secondary | ICD-10-CM | POA: Diagnosis not present

## 2021-06-11 DIAGNOSIS — G8929 Other chronic pain: Secondary | ICD-10-CM

## 2021-06-11 DIAGNOSIS — R7301 Impaired fasting glucose: Secondary | ICD-10-CM

## 2021-06-11 LAB — POCT GLYCOSYLATED HEMOGLOBIN (HGB A1C): Hemoglobin A1C: 5.3 % (ref 4.0–5.6)

## 2021-06-11 LAB — GLUCOSE, CAPILLARY: Glucose-Capillary: 103 mg/dL — ABNORMAL HIGH (ref 70–99)

## 2021-06-11 MED ORDER — PAROXETINE HCL 20 MG PO TABS: ORAL_TABLET | ORAL | 1 refills | Status: DC

## 2021-06-11 MED ORDER — DICLOFENAC SODIUM 1 % EX GEL: 4.0000 g | Freq: Four times a day (QID) | CUTANEOUS | 1 refills | Status: DC

## 2021-06-11 NOTE — Patient Instructions (Signed)
Thank you, Ms.[name] for allowing [place] to provide your care today. Today we discussed: Anxiety: Continue taking paroxetine 20mg  daily, I have sent a refill to your pharmacy. Continue to take hydroxyzine as needed for anxiety, as well. Please call to make an appt in our clinic with Dr. (psychologist)  Knee pain: Sent in voltaren gel for you to apply to your knee to help with pain relief. Also purchase an over the counter knee brace/sleeve to help you. You can also take aleve/ibuprofen for pain management as well. It is also recommended that you exercise regularly- 2 great examples for your knee would be walking or swimming  Iron deficiency: With your heavy periods, your iron has been very low. It is recommended that you pick up Niferix iron supplements (150mg  capsule) to take every other day to help bring your iron up.   Weight: Continue with watching your diet and exercising. Advised to cut out 500 calories per day, to try to lose 1 lb per week! We are checking your sugar level today and can discuss medication options in the future to help with weight loss if need be  Carpal tunnel: Purchase 2 over the counter carpal tunnel/wrist braces to wear at night  I have ordered the following labs for you:   Lab Orders         CMP14 + Anion Gap         CBC no Diff         QuantiFERON-TB Gold Plus         POC Hbg A1C        Referrals ordered today:   Referral Orders  No referral(s) requested today     I have ordered the following medication/changed the following medications:   Stop the following medications: Medications Discontinued During This Encounter  Medication Reason   medroxyPROGESTERone (PROVERA) 10 MG tablet Completed Course   PARoxetine (PAXIL) 20 MG tablet Reorder   diclofenac Sodium (VOLTAREN) 1 % GEL Reorder     Start the following medications: Meds ordered this encounter  Medications   PARoxetine (PAXIL) 20 MG tablet    Sig: TAKE 1 TABLET(20 MG) BY MOUTH EVERY  EVENING    Dispense:  90 tablet    Refill:  1   diclofenac Sodium (VOLTAREN) 1 % GEL    Sig: Apply 4 g topically 4 (four) times daily.    Dispense:  150 g    Refill:  1     Follow up: 3-4 months     Should you have any questions or concerns please call the internal medicine clinic at [phone].     , D.O. [facility]

## 2021-06-11 NOTE — Progress Notes (Signed)
   CC: 2 month follow up  HPI:  Ms.Miquel Oltmann is a 55 y.o. female with anemia, anxiety, and depression who presents to the [facility] for a follow up regarding knee pain and anxiety. Please see problem-based list for further details, assessments, and plans.   Past Medical History:  Diagnosis Date   Anemia    IDA   Anxiety    Depression    Hernia of abdominal wall    History of drug abuse (HCC)    has not used in ten years   Substance abuse (HCC)    2012 QUIT Heroin   Transfusion history    due to anemia with hb 4.0   Review of Systems:  Review of Systems  Constitutional:  Negative for chills and fever.  HENT: Negative.    Respiratory:  Negative for cough and shortness of breath.   Cardiovascular:  Negative for chest pain and palpitations.  Gastrointestinal:  Negative for diarrhea, nausea and vomiting.  Genitourinary: Negative.   Musculoskeletal:  Positive for joint pain.  Neurological:  Negative for dizziness.  Psychiatric/Behavioral:  The patient is nervous/anxious.     Physical Exam:  Vitals:   06/11/21 0935  BP: 129/81  Pulse: 93  Resp: (!) 24  Temp: 98.9 F (37.2 C)  TempSrc: Oral  SpO2: 99%  Weight: 236 lb 11.2 oz (107.4 kg)  Height: 5\' 4"  (1.626 m)   General: No acute distress. CV: RRR. No murmurs, rubs, or gallops. No LE edema Pulmonary: Lungs CTAB. Normal effort. No wheezing or rales. Abdominal: Soft, nontender, nondistended. Normal bowel sounds. Extremities: Palpable radial and DP pulses. Normal ROM. +Tinel's sign at the bilateral wrists. Tenderness to palpation of L knee around patellofemoral joint Skin: Warm and dry. No obvious rash or lesions. Neuro: A&Ox3. Moves all extremities. Normal sensation. No focal deficit. Psych: Anxious mood and affect   Assessment & Plan:   See Encounters Tab for problem based charting.  Patient seen with Dr. 

## 2021-06-11 NOTE — Assessment & Plan Note (Signed)
Patient has a long history of iron deficiency anemia secondary to chronic blood loss from menstrual periods. She is perimenopausal now, and reports that when she does have a cycle, she has significantly heavy bleeding and will soak through ~10 pads a day. Also has a known history of uterine fibroids which could be contributing to her blood loss. She notes that she has not taken her iron supplement in while, as she thought it was making her more anxious. Will advise patient to stop taking ferrous sulfate and will switch iron supplement.  Plan: - Advised to pick up OTC Niferix (iron polysaccharide 150 mg capsule) and to take every other day - CBC checked today

## 2021-06-11 NOTE — Assessment & Plan Note (Signed)
Declined flu shot today.

## 2021-06-11 NOTE — Assessment & Plan Note (Signed)
Patient has lost 4lbs since her last visit through exercise and being more conscious of her diet. She notes that snickers are her weakness, but she has tried to watch what she eats more regularly. Discussed weight loss at length with the patient and advised her to continue with exercise everyday, as well as to try to cut out ~500 calories per day, to attempt to lose 1 lb per week. Discussed that this would most likely improve the patient's knee pain as well. Also discussed various weight loss medications with the patient, however, the GLP-1s are not well covered by insurance without a diagnosis of diabetes. With the patient's obesity putting her at an increased risk of diabetes, we will check an A1c today.  Plan:: - A1c - Low calorie/low fat diet and exercise recommended

## 2021-06-11 NOTE — Assessment & Plan Note (Signed)
Patient reports that her anxiety and panic attacks have been stable since she was last seen. She has had no further panic attacks, but is still anxious, although improved. She states that taking paroxetine in the evenings significantly helps her, and she likes the dose she is at and does not wish to make any adjustments today. She also takes hydroxyzine 25mg  PRN for worsening anxiety, although she states that she rarely takes this.   Plan: - Continue paroxetine 20mg  qhs - Hydroxyzine 25mg  q8h PRN  - Advised to call Dr. to make an appt (referral already sent in and tried to contact pt)

## 2021-06-11 NOTE — Assessment & Plan Note (Signed)
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 She states that her pain is worse with ambulating and she often hears a "clicking" in her knee. Her pain is better with resting. She has not tried any OTC meds and has not used the voltaren gel that was prescribed to her, nor has she tried a knee brace/sleeve that was recommended. Also discussed with the patient that weight loss could significantly help her knee pain, which she understands. She has been exercising more often and has lost 4 lbs since her visit 2 months ago. Recommended a walking program or swimming to help her knee pain, as well as to help with weight loss.   Plan: - Voltaren gel to left knee - Knee brace/sleeve - Can take aleve/NSAIDs as needed for pain  - Weight loss recommended

## 2021-06-11 NOTE — Assessment & Plan Note (Signed)
Patient complaining of bilateral numbness and tingling in her 2nd-4th digits. She notes that this seems to be worse when she is lifting heavy objects at work, but it also comes on occassionally without any provoking factors. On exam, patient had a positive Tinel's sign bilaterally. Discussed the diagnosis of carpal tunnel with the patient and that she is advised to purchase 2 OTC wrist braces to wear at night to help "open up" the carpal tunnel to relieve her symptoms. Also made the patient aware that repetitive use of her hands/wrists with activities such as lifting and typing will make her sxs worse.  Plan: - OTC carpal tunnel braces qhs

## 2021-06-12 LAB — CMP14 + ANION GAP
ALT: 9 IU/L (ref 0–32)
AST: 18 IU/L (ref 0–40)
Albumin/Globulin Ratio: 1.3 (ref 1.2–2.2)
Albumin: 3.9 g/dL (ref 3.8–4.9)
Alkaline Phosphatase: 110 IU/L (ref 44–121)
Anion Gap: 13 mmol/L (ref 10.0–18.0)
BUN/Creatinine Ratio: 20 (ref 9–23)
BUN: 14 mg/dL (ref 6–24)
Bilirubin Total: <0.2 mg/dL (ref 0.0–1.2)
CO2: 22 mmol/L (ref 20–29)
Calcium: 8.7 mg/dL (ref 8.7–10.2)
Chloride: 102 mmol/L (ref 96–106)
Creatinine, Ser: 0.7 mg/dL (ref 0.57–1.00)
Globulin, Total: 2.9 g/dL (ref 1.5–4.5)
Glucose: 92 mg/dL (ref 65–99)
Potassium: 4.6 mmol/L (ref 3.5–5.2)
Sodium: 137 mmol/L (ref 134–144)
Total Protein: 6.8 g/dL (ref 6.0–8.5)
eGFR: 103 mL/min/{1.73_m2} (ref >59)

## 2021-06-12 LAB — CBC
Hematocrit: 34.8 % (ref 34.0–46.6)
Hemoglobin: 10.8 g/dL — ABNORMAL LOW (ref 11.1–15.9)
MCH: 24.6 pg — ABNORMAL LOW (ref 26.6–33.0)
MCHC: 31 g/dL — ABNORMAL LOW (ref 31.5–35.7)
MCV: 79 fL (ref 79–97)
Platelets: 212 10*3/uL (ref 150–450)
RBC: 4.39 x10E6/uL (ref 3.77–5.28)
RDW: 14.5 % (ref 11.7–15.4)
WBC: 4.6 10*3/uL (ref 3.4–10.8)

## 2021-06-14 ENCOUNTER — Ambulatory Visit: Payer: Medicare Other | Admitting: Behavioral Health

## 2021-06-15 ENCOUNTER — Encounter: Payer: Self-pay | Admitting: *Deleted

## 2021-06-15 LAB — QUANTIFERON-TB GOLD PLUS
QuantiFERON Mitogen Value: >10 IU/mL
QuantiFERON Nil Value: 0.01 IU/mL
QuantiFERON TB1 Ag Value: 0.02 IU/mL
QuantiFERON TB2 Ag Value: 0.01 IU/mL
QuantiFERON-TB Gold Plus: NEGATIVE

## 2021-06-15 NOTE — Progress Notes (Signed)
06-15-2021 at 10:20  Per request of the patient and Dr Raymondo Band, a copy of the QuantiFERON-TB Gold Plus  was faxed to      [place]: Nursing, at [phone].  Maryan Rued, PBT [facility]

## 2021-06-15 NOTE — Progress Notes (Signed)
Internal Medicine Clinic Attending ° °I saw and evaluated the patient.  I personally confirmed the key portions of the history and exam documented by Dr. Atway and I reviewed pertinent patient test results.  The assessment, diagnosis, and plan were formulated together and I agree with the documentation in the resident’s note.  °

## 2021-06-29 ENCOUNTER — Ambulatory Visit: Payer: Medicare Other | Admitting: Behavioral Health

## 2021-06-29 ENCOUNTER — Telehealth: Payer: Self-pay | Admitting: Behavioral Health

## 2021-06-29 NOTE — Telephone Encounter (Signed)
Spoke w/Pt briefly today for 10 min call re: her current needs. Pt does not want medication. She wants a Paramedic for her situation @ home that is stressful. Pt requests a SW to assist her w/her housing needs. She is on the [employer] Wait List & she has been placed on other Wait Lists which she did not detail.  Pt is upset over female friend she lives w/who now has a BF who stays over all the time & they want her out. Pt is very concerned for the situation as it makes her uncomfortable.  Mailed [facility] Info pamphlet to Pt for LT Psychotherapy services. Placed Pt on Clinician schedule for next available 60 min slot on telehealth.  Pt does not usually answer her phone @ work or listen to her msg. Instructed Pt she may want to do this or she will miss important info from [facility]. Pt acknowledged & agreed.  Dr. Monna Fam

## 2021-07-01 ENCOUNTER — Encounter: Payer: Self-pay | Admitting: *Deleted

## 2021-07-01 NOTE — Progress Notes (Unsigned)

## 2021-07-08 ENCOUNTER — Telehealth: Payer: Self-pay | Admitting: Internal Medicine

## 2021-07-08 NOTE — Telephone Encounter (Signed)
Things That May Be Affecting Your Health:  Alcohol  Hearing loss  Pain    Depression  Home Safety  Sexual Health   Diabetes Y Lack of physical activity  Stress   Difficulty with daily activities  Loneliness  Tiredness   Drug use  Medicines Y Tobacco use   Falls  Motor Vehicle Safety Y Weight   Food choices  Oral Health  Other    YOUR PERSONALIZED HEALTH PLAN : 1. Schedule your next subsequent Medicare Wellness visit in one year 2. Attend all of your regular appointments to address your medical issues 3. Complete the preventative screenings and services   Annual Wellness Visit   Medicare Covered Preventative Screenings and Services  Services & Screenings Men and Women Who How Often Need? Date of Last Service Action  Abdominal Aortic Aneurysm Adults with AAA risk factors Once  @[facility]IM[facility]ADMINDATE    Alcohol Misuse and Counseling All Adults Screening once a year if no alcohol misuse. Counseling up to 4 face to face sessions.     Bone Density Measurement  Adults at risk for osteoporosis Once every 2 yrs  @[facility]ATE ([phone])@    Lipid Panel Z13.6 All adults without CV disease Once every 5 yrs  @[facility]INDATE     Colorectal Cancer  Stool sample or Colonoscopy All adults 50 and older  Once every year Every 10 years  @[facility] ([phone])@ @[facility]IMMADMINDATE @[facility]IM[facility]AD[facility]INDATE ([phone])@    Depression All Adults Once a year Y Today   Diabetes Screening Blood glucose, post glucose load, or GTT Z13.1 All adults at risk Pre-diabetics Once per year Twice per year  @[facility]IM[facility]ADMINDATE    Diabetes  Self-Management Training All adults Diabetics 10 hrs first year; 2 hours subsequent years. Requires Copay     Glaucoma Diabetics Family history of glaucoma African Americans 50 yrs + Hispanic Americans 65 yrs + Annually - requires coppay  @[facility]IMMADMINDATE [phone])@    Hepatitis C Z72.89 or F19.20 High Risk for HCV Born between  1945 and 1965 Annually Once      HIV Z11.4 All adults based on risk Annually btw ages 86 & 17 regardless of risk Annually > 65 yrs if at increased risk      Lung Cancer Screening Asymptomatic adults aged 47-77 with 30 pack yr history and current smoker OR quit within the last 15 yrs Annually Must have counseling and shared decision making documentation before first screen      Medical Nutrition Therapy Adults with  Diabetes Renal disease Kidney transplant within past 3 yrs 3 hours first year; 2 hours subsequent years     Obesity and Counseling All adults Screening once a year Counseling if BMI 30 or higher Y Today   Tobacco Use Counseling Adults who use tobacco  Up to 8 visits in one year Y    Vaccines Z23 Hepatitis B Influenza  Pneumonia  Adults  Once Once every flu season Two different vaccines separated by one year     Next Annual Wellness Visit People with Medicare Every year  Today     Services & Screenings Women Who How Often Need  Date of Last Service Action  Mammogram  Z12.31 Women over 40 One baseline ages 35-39. Annually ager 40 yrs+      Pap tests All women Annually if high risk. Every 2 yrs for normal risk women      Screening for cervical cancer with  Pap (Z01.419 nl or Z01.411abnl) & HPV Z11.51 Women aged 76 to 80 Once every 5 yrs  Screening pelvic and breast exams All women Annually if high risk. Every 2 yrs for normal risk women     Sexually Transmitted Diseases Chlamydia Gonorrhea Syphilis All at risk adults Annually for non pregnant females at increased risk         Services & Screenings Men Who How Ofter Need  Date of Last Service Action  Prostate Cancer - DRE & PSA Men over 50 Annually.  DRE might require a copay.        Sexually Transmitted Diseases Syphilis All at risk adults Annually for men at increased risk      Health Maintenance List Health Maintenance  Topic Date Due   Zoster Vaccines- Shingrix (1 of 2) Never done   COVID-19  Vaccine (3 - Booster for Moderna series) 06/23/2020   INFLUENZA VACCINE  Never done   MAMMOGRAM  07/03/2022   PAP SMEAR-Modifier  08/29/2023   TETANUS/TDAP  09/02/2029   COLONOSCOPY (Pts 45-19yrs Insurance coverage will need to be confirmed)  10/19/2030   Hepatitis C Screening  Completed   HIV Screening  Completed   HPV VACCINES  Aged Out    a

## 2021-07-15 ENCOUNTER — Ambulatory Visit (INDEPENDENT_AMBULATORY_CARE_PROVIDER_SITE_OTHER): Payer: Medicare Other | Admitting: Student in an Organized Health Care Education/Training Program

## 2021-07-15 ENCOUNTER — Encounter: Payer: Self-pay | Admitting: Internal Medicine

## 2021-07-15 DIAGNOSIS — F419 Anxiety disorder, unspecified: Secondary | ICD-10-CM

## 2021-07-15 NOTE — Progress Notes (Signed)
    No charge visit for this 55 year old person here for follow up of anxiety. She came an hour late to clinic and was insistent on being evaluated, declined to reschedule. I offered to see her briefly to address her most urgent need. She told me about her concerns that her anxiety was worsening around the time of heavy menstrual periods because the paroxetine was being removed from her system by the bleeding. 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 I gave her some education from my perspective these medicines levels are going to be the same despite her bleeding, and something else is likely driving her anxiety. Her GAD and PHQ9 scores are both less than 5. I offered her options to change from paroxetine to a different SSRI, but she declined, she is worried about feeling a withdrawal sensation. I offered to increase the dose of the paroxetine, but she declined that as well. She can follow up with her as usual and we can continue this discussion in the future.

## 2021-07-19 ENCOUNTER — Encounter: Payer: Medicare Other | Admitting: Internal Medicine

## 2021-07-20 ENCOUNTER — Ambulatory Visit: Payer: Medicare Other | Admitting: Behavioral Health

## 2021-07-20 ENCOUNTER — Telehealth: Payer: Self-pay | Admitting: Behavioral Health

## 2021-07-20 NOTE — Telephone Encounter (Signed)
Unable to lv msg for Pt today re: [facility] telehealth session @ 9:30am. Pt's Mbox is full.   Dr. Monna Fam

## 2021-08-04 ENCOUNTER — Ambulatory Visit (INDEPENDENT_AMBULATORY_CARE_PROVIDER_SITE_OTHER): Payer: Medicare Other | Admitting: Internal Medicine

## 2021-08-04 ENCOUNTER — Other Ambulatory Visit: Payer: Self-pay

## 2021-08-04 ENCOUNTER — Encounter: Payer: Self-pay | Admitting: Internal Medicine

## 2021-08-04 VITALS — BP 120/76 | HR 94 | Temp 98.4°F | Resp 32 | Ht 64.0 in | Wt 238.5 lb

## 2021-08-04 DIAGNOSIS — F410 Panic disorder [episodic paroxysmal anxiety] without agoraphobia: Secondary | ICD-10-CM | POA: Diagnosis not present

## 2021-08-04 DIAGNOSIS — Z0000 Encounter for general adult medical examination without abnormal findings: Secondary | ICD-10-CM

## 2021-08-04 DIAGNOSIS — Z13220 Encounter for screening for lipoid disorders: Secondary | ICD-10-CM | POA: Diagnosis not present

## 2021-08-04 DIAGNOSIS — F419 Anxiety disorder, unspecified: Secondary | ICD-10-CM | POA: Diagnosis not present

## 2021-08-04 MED ORDER — PAROXETINE HCL 30 MG PO TABS: ORAL_TABLET | ORAL | 2 refills | Status: DC

## 2021-08-04 MED ORDER — HYDROXYZINE HCL 10 MG PO TABS: 25.0000 mg | ORAL_TABLET | Freq: Three times a day (TID) | ORAL | 2 refills | Status: DC | PRN

## 2021-08-04 NOTE — Assessment & Plan Note (Signed)
Lipid panel suggested, pt prefers a later time.

## 2021-08-04 NOTE — Assessment & Plan Note (Addendum)
Patient reports that her panic attacks have been stable on Paroxetine 20 mg, however she reports that her anxiety has been "worse" these past 3 weeks after she stopped working at [employer], and has more time and is observant of her feelings and emotions. Reports difficulty with sleep onset. Feels tired and stressed throughout the day, despite no identifiable triggers. She believes that anxiety is resulting in increased metabolism of her medications, causing the paroxetine to not work properly. Reports taking hydroxyzine 25 q8h very infrequently, only when she feels the onset of a panic attack or worsening anxiety. She is educated that this is not true, however she continues to have doubts. Suggested increasing dose of paroxetine from 20 to 30, and pt is mostly agreeable; she has concerns that increased dose may precipitate panic attacks,  counseled and reassured pt that increasing dose will not specifically cause panic attacks. Vitals and exam wnl. Denies chest pain, headaches, palpitations, SHOB, leg swelling or confusion. PHQ-9 is 0 and GAD-7 is 8. She remains agreeable to paroxetine 30 and hydroxyzine 25 q8h prn.   Vitals:   08/04/21 1015  BP: 120/76  Pulse: 94  Resp: (!) 32  Temp: 98.4 F (36.9 C)  SpO2: 100%    Plan: - Increased paroxetine 20 to 30 mg qhs - Hydroxyzine 25mg  q8h PRN  - Journal when/where anxiety is worst and when it is triggered, to better identify patterns.  - Follow up with Dr. to make an appt

## 2021-08-04 NOTE — Patient Instructions (Signed)
Thank you, Ms.[name] for allowing [place] to provide your care today. Today we discussed:  Anxiety: For this, we increased your paroxetine from 20 to 30 mg. Also, try taking hydroxyzine daily for the next several weeks until the increase dose of paroxetine starts working.    Start the following medications: Meds ordered this encounter  Medications   PARoxetine (PAXIL) 30 MG tablet    Sig: TAKE 1 TABLET(20 MG) BY MOUTH EVERY EVENING    Dispense:  90 tablet    Refill:  2   hydrOXYzine (ATARAX/VISTARIL) 10 MG tablet    Sig: Take 2.5 tablets (25 mg total) by mouth every 8 (eight) hours as needed.    Dispense:  30 tablet    Refill:  2      Follow up: 6 weeks    Should you have any questions or concerns please call the internal medicine clinic at [phone].     Carmel Sacramento, MD  [facility]

## 2021-08-04 NOTE — Assessment & Plan Note (Signed)
Reports no recent panic attacks; pt feels like panic disorder is controlled at this time and satisfied with regimen. Her last ED visit for panic attack was in July. Reports taking hydroxyzine as needed. Psychiatry referral was placed during previous visit.   -Paroxetine 20 increased to 30 mg daily for uncontrolled GAD.

## 2021-08-04 NOTE — Progress Notes (Signed)
CC: anxiety   HPI:  [name] is a 55 y.o. female with a PMHx stated below and presents today for uncontrolled anxiety on paroxetine 20 mg.   Please see problem based assessment and plan for additional details.  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 150 g 1   ferrous sulfate 325 (65 FE) MG tablet Take 1 tablet (325 mg total) by mouth daily with breakfast. (Patient not taking: No sig reported) 90 tablet 0   methadone (DOLOPHINE) 10 MG/5ML solution Take 37 mg by mouth every morning.     PARoxetine (PAXIL) 20 MG tablet TAKE 1 TABLET(20 MG) BY MOUTH EVERY EVENING 90 tablet 1   [DISCONTINUED] famotidine (PEPCID) 20 MG tablet Take 1 tablet (20 mg total) by mouth 2 (two) times daily. (Patient not taking: Reported on 09/03/2019) 30 tablet 0   No current facility-administered medications on file prior to visit.    Family History  Problem Relation Age of Onset   Cancer Mother    Cancer Father    Colon cancer Neg Hx    Colon polyps Neg Hx    Esophageal cancer Neg Hx    Rectal cancer Neg Hx    Stomach cancer Neg Hx     Social History   Socioeconomic History   Marital status: Single    Spouse name: Not on file   Number of children: Not on file   Years of education: Not on file   Highest education level: Not on file  Occupational History   Not on file  Tobacco Use   Smoking status: Every Day    Packs/day: 0.25    Types: Cigarettes   Smokeless tobacco: Never   Tobacco comments:    4-5  cigarettes per day.   Wants to stop  Vaping Use   Vaping Use: Never used  Substance and  Sexual Activity   Alcohol use: Not Currently    Comment: 2012   Drug use: Not Currently    Types: Heroin, Marijuana    Comment: last time used drugs was 2012   Sexual activity: Not Currently    Birth control/protection: Surgical  Other Topics Concern   Not on file  Social History Narrative   Not on file   Social Determinants of Health   Financial Resource Strain: Not on file  Food Insecurity: Food Insecurity Present   Worried About Running Out of Food in the Last Year: Sometimes true   Ran Out of Food in the Last Year: Sometimes true  Transportation Needs: No Transportation Needs   Lack of Transportation (Medical): No   Lack of Transportation (Non-Medical): No  Physical Activity: Not on file  Stress: Not on file  Social Connections: Not on file  Intimate Partner Violence: Not on file    Review of Systems: ROS negative except for what is noted on the assessment and plan.  Vitals:   08/04/21 1015  BP: 120/76  Pulse: 94  Resp: Marland Kitchen)  32  Temp: 98.4 F (36.9 C)  TempSrc: Oral  SpO2: 100%  Weight: 238 lb 8 oz (108.2 kg)  Height: 5\' 4"  (1.626 m)     Physical Exam: Constitutional: alert, well-appearing, in NAD HENT: normocephalic, atraumatic, mucous membranes moist Cardiovascular: RRR, no m/r/g, non-edematous bilateral LE Pulmonary/Chest: normal work of breathing on room air, LCTAB Abdominal: soft, non-tender to palpation, non-distended MSK: normal bulk and tone Neurological: A&O x 3, 5/5 strength in bilateral upper and lower extremities  Skin: warm and dry Psych: restless behavior, normal affect   Assessment & Plan:   See Encounters Tab for problem based charting.  Patient seen with Dr. , MD  Internal Medicine Resident, PGY-1 [facility]  10:36 AM, 08/04/2021

## 2021-08-05 NOTE — Progress Notes (Signed)
Internal Medicine Clinic Attending  I saw and evaluated the patient.  I personally confirmed the key portions of the history and exam documented by Dr. Patel and I reviewed pertinent patient test results.  The assessment, diagnosis, and plan were formulated together and I agree with the documentation in the resident's note.  

## 2021-08-10 ENCOUNTER — Ambulatory Visit: Payer: Medicare Other | Admitting: Behavioral Health

## 2021-08-10 ENCOUNTER — Telehealth: Payer: Self-pay | Admitting: Behavioral Health

## 2021-08-10 NOTE — Telephone Encounter (Signed)
Called Pt for 3:30pm [facility]BH telehealth appt today. Pt is busy @ work & cannot gain privacy for session. Directed Pt to [facility] & schedule @ her convenience.  Dr. Monna Fam

## 2021-08-16 ENCOUNTER — Telehealth: Payer: Self-pay

## 2021-08-16 NOTE — Telephone Encounter (Signed)
RTC, VM obtained and message left that nurse was returning her call. SChaplin, RN,BSN  

## 2021-08-16 NOTE — Telephone Encounter (Signed)
Requesting to speak with a nurse about PARoxetine (PAXIL) 30 MG tablet. Pt states she would like to stay on 20mg  instead 30mg . Please call pt back.

## 2021-09-13 ENCOUNTER — Encounter: Payer: Medicare Other | Admitting: Internal Medicine

## 2021-09-29 ENCOUNTER — Encounter: Payer: Medicare Other | Admitting: Internal Medicine

## 2021-09-29 ENCOUNTER — Telehealth: Payer: Self-pay | Admitting: *Deleted

## 2021-09-29 NOTE — Telephone Encounter (Signed)
Called patient, no answer, unable to leave message. Call regarding patient missed appointment. Patient can call the clinic at [phone] to reschedule.

## 2021-09-30 ENCOUNTER — Ambulatory Visit: Payer: Medicare Other | Admitting: Obstetrics and Gynecology

## 2021-10-05 ENCOUNTER — Ambulatory Visit: Payer: Medicare Other | Admitting: Obstetrics and Gynecology

## 2021-10-07 ENCOUNTER — Encounter: Payer: Medicare Other | Admitting: Internal Medicine

## 2021-10-28 ENCOUNTER — Encounter: Payer: Self-pay | Admitting: Internal Medicine

## 2021-10-28 ENCOUNTER — Other Ambulatory Visit: Payer: Self-pay

## 2021-10-28 ENCOUNTER — Ambulatory Visit (INDEPENDENT_AMBULATORY_CARE_PROVIDER_SITE_OTHER): Payer: Medicare Other | Admitting: Internal Medicine

## 2021-10-28 VITALS — BP 117/76 | HR 90 | Temp 98.2°F | Resp 28 | Ht 64.0 in | Wt 257.3 lb

## 2021-10-28 DIAGNOSIS — F411 Generalized anxiety disorder: Secondary | ICD-10-CM

## 2021-10-28 DIAGNOSIS — K5903 Drug induced constipation: Secondary | ICD-10-CM

## 2021-10-28 MED ORDER — POLYETHYLENE GLYCOL 3350 17 G PO PACK: 17.0000 g | PACK | Freq: Every day | ORAL | 2 refills | Status: AC

## 2021-10-28 NOTE — Progress Notes (Signed)
Subjective:  CC: GAD  HPI:  [name] is a 56 y.o. female with a past medical history stated below and presents today for GAD. Please see problem based assessment and plan for additional details.  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 150 g 1   ferrous sulfate 325 (65 FE) MG tablet Take 1 tablet (325 mg total) by mouth daily with breakfast. (Patient not taking: No sig reported) 90 tablet 0   hydrOXYzine (ATARAX/VISTARIL) 10 MG tablet Take 2.5 tablets (25 mg total) by mouth every 8 (eight) hours as needed. 30 tablet 2   methadone (DOLOPHINE) 10 MG/5ML solution Take 37 mg by mouth every morning.     PARoxetine (PAXIL) 30 MG tablet TAKE 1 TABLET(20 MG) BY MOUTH EVERY EVENING 90 tablet 2   [DISCONTINUED] famotidine (PEPCID) 20 MG tablet Take 1 tablet (20 mg total) by mouth 2 (two) times daily. (Patient not taking: Reported on 09/03/2019) 30 tablet 0   No current facility-administered medications on file prior to visit.    Family History  Problem Relation Age of Onset   Cancer Mother    Cancer Father    Colon cancer Neg Hx    Colon polyps Neg Hx    Esophageal cancer Neg Hx    Rectal cancer Neg Hx    Stomach cancer Neg Hx     Social History   Socioeconomic History   Marital status: Single    Spouse name: Not on file   Number of children: Not on file   Years of education: Not on file   Highest education level: Not on file  Occupational History   Not on file  Tobacco Use   Smoking status: Every Day    Packs/day: 0.25    Types: Cigarettes   Smokeless tobacco: Never    Tobacco comments:    4-5  cigarettes per day.   Wants to stop  Vaping Use   Vaping Use: Never used  Substance and Sexual Activity   Alcohol use: Not Currently    Comment: 2012   Drug use: Not Currently    Types: Heroin, Marijuana    Comment: last time used drugs was 2012   Sexual activity: Not Currently    Birth control/protection: Surgical  Other Topics Concern   Not on file  Social History Narrative   Not on file   Social Determinants of Health   Financial Resource Strain: Not on file  Food Insecurity: Food Insecurity Present   Worried About Running Out of Food in the Last Year: Sometimes true   Ran Out of Food in the Last Year: Sometimes true  Transportation Needs: No Transportation Needs   Lack of Transportation (Medical): No   Lack of Transportation (Non-Medical): No  Physical Activity: Not on file  Stress: Not on file  Social Connections: Not on file  Intimate Partner Violence: Not on file    Review of Systems: ROS negative except for what is  noted on the assessment and plan.  Objective:   Vitals:   10/28/21 1002  BP: 117/76  Pulse: 90  Resp: (!) 28  Temp: 98.2 F (36.8 C)  TempSrc: Oral  SpO2: 100%  Weight: 257 lb 4.8 oz (116.7 kg)  Height: 5\' 4"  (1.626 m)    Physical Exam: Gen: A&O x3 and in no apparent distress, well appearing and nourished. HEENT:    Head - normocephalic, atraumatic.    Eye - visual acuity grossly intact, conjunctiva clear, sclera non-icteric, EOM intact.  Neck: no masses or nodules, AROM intact. CV: RRR, no murmurs, S1/S2 presents  Resp: Clear to ascultation bilaterally  Abd: BS (+) x4, soft, non-tender abdomen, without hepatosplenomegaly or masses MSK: Grossly normal AROM and strength x4 extremities.  Neuro: No focal deficits, grossly normal sensation and coordination.  Psych: Oriented x3 and responding appropriately. Intact memory, anxious mood, normal judgement, affect, and insight.    Assessment & Plan:  See Encounters  Tab for problem based charting.  Patient discussed with Dr. , D.O. [facility]   PGY-3 Pager: [phone]   Phone: [phone] Date 10/29/2021   Time 8:59 AM

## 2021-10-28 NOTE — Patient Instructions (Signed)
Thank you, [name] for allowing [place] to provide your care today. Today we discussed Anxiety and Constipation.    Labs/Tests Ordered: Lab Orders  No laboratory test(s) ordered today     Referrals Ordered:  Referral Orders  No referral(s) requested today     Medication Changes:  There are no discontinued medications.   Meds ordered this encounter  Medications   polyethylene glycol (MIRALAX) 17 g packet    Sig: Take 17 g by mouth daily.    Dispense:  30 packet    Refill:  2     Health Maintenance Screening: There are no preventive care reminders to display for this patient.   Instructions:  - Please drink about 64 ounces daily (8 cups a day) - Try over the counter metamucil or fiber supplement - Start miralax daily (aim for 1 semi-soft bowel movement daily)  Follow up:  Please make a 1-2 week follow up with Dr. Monna Fam and a 3-4 week follow up with a medical doctor.    Remember: If you have any questions or concerns, call our clinic at [phone] or after hours call [phone] and ask for the internal medicine resident on call.  Dellia Cloud, D.O. [facility]

## 2021-10-29 ENCOUNTER — Encounter: Payer: Self-pay | Admitting: Internal Medicine

## 2021-10-29 DIAGNOSIS — K5900 Constipation, unspecified: Secondary | ICD-10-CM | POA: Insufficient documentation

## 2021-10-29 NOTE — Assessment & Plan Note (Signed)
HPI: Patient presents for further evaluation and management of General anxiety disorder.  Patient was previously seen back in November and was started on an increased dose of paroxetine at 30 mg and continued on hydroxyzine 25 mg as needed for panic attacks.  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  She denies any recent relapses.  She states that she thinks she is having a side effect of the increased dose of paroxetine and describes abdominal bloating and distention with constipation.  She has a bowel movement every 2 to 3 days.  She expresses concerns regarding changes in her bowel habits and how that will affect the metabolism of her anxiety medication.  She believes this medication may be too strong.   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 -We will continue paroxetine at 30 mg daily. -Offered an alternative SSRI and add on therapy of possible buspirone or even gabapentin.  Patient indicated that she would like to think about this. -Instructed patient to make a follow-up appointment with Dr. Grace Blight in the next 1 to 2 weeks. -Encourage patient to remain going to her AA meetings when possible. -We will need to follow-up in 3 to 4 weeks to discuss additional medication management at that time as her symptoms are not fully controlled.

## 2021-10-29 NOTE — Assessment & Plan Note (Signed)
HPI: Patient presents for further evaluation and management of Constipation.  Patient endorses abdominal discomfort and bloating with decreased bowel movements.  She has a bowel movement every 2 to 3 days.  She denies any symptoms of bowel obstruction.   Assessment/Plan: Constipation secondary to opioid medication: -Prescribed MiraLAX daily.

## 2021-11-01 NOTE — Progress Notes (Signed)
Internal Medicine Clinic Attending  Case discussed with Dr. Coe  At the time of the visit.  We reviewed the resident's history and exam and pertinent patient test results.  I agree with the assessment, diagnosis, and plan of care documented in the resident's note.  

## 2021-11-26 ENCOUNTER — Other Ambulatory Visit: Payer: Self-pay

## 2021-11-26 ENCOUNTER — Ambulatory Visit (HOSPITAL_COMMUNITY)
Admission: RE | Admit: 2021-11-26 | Discharge: 2021-11-26 | Disposition: A | Discharge status: Home / Self Care | Payer: Medicare Other | Source: Ambulatory Visit | Attending: Internal Medicine | Admitting: Internal Medicine

## 2021-11-26 ENCOUNTER — Ambulatory Visit (INDEPENDENT_AMBULATORY_CARE_PROVIDER_SITE_OTHER): Payer: Medicare Other | Admitting: Internal Medicine

## 2021-11-26 ENCOUNTER — Encounter: Payer: Self-pay | Admitting: Internal Medicine

## 2021-11-26 ENCOUNTER — Telehealth: Payer: Self-pay | Admitting: Behavioral Health

## 2021-11-26 VITALS — BP 121/75 | HR 88 | Temp 98.6°F | Ht 64.0 in | Wt 252.2 lb

## 2021-11-26 DIAGNOSIS — F411 Generalized anxiety disorder: Secondary | ICD-10-CM

## 2021-11-26 DIAGNOSIS — F419 Anxiety disorder, unspecified: Secondary | ICD-10-CM

## 2021-11-26 DIAGNOSIS — F410 Panic disorder [episodic paroxysmal anxiety] without agoraphobia: Secondary | ICD-10-CM | POA: Diagnosis not present

## 2021-11-26 DIAGNOSIS — R002 Palpitations: Secondary | ICD-10-CM | POA: Diagnosis present

## 2021-11-26 DIAGNOSIS — Z1211 Encounter for screening for malignant neoplasm of colon: Secondary | ICD-10-CM | POA: Diagnosis not present

## 2021-11-26 MED ORDER — PAROXETINE HCL 30 MG PO TABS: ORAL_TABLET | ORAL | 0 refills | Status: DC

## 2021-11-26 MED ORDER — PAROXETINE HCL 20 MG PO TABS: 20.0000 mg | ORAL_TABLET | Freq: Every day | ORAL | 1 refills | Status: DC

## 2021-11-26 MED ORDER — PAROXETINE HCL 30 MG PO TABS: ORAL_TABLET | ORAL | 2 refills | Status: DC

## 2021-11-26 NOTE — Progress Notes (Signed)
? ?CC: uncontrolled anxiety follow up  ? ?HPI: ? ?Ms.Lessa Huge is a 56 y.o. female with a PMHx stated below and presents today for stated above. Please see the Encounters tab for problem-based Assessment & Plan for additional details.  ? ?Past Medical History:  ?Diagnosis Date  ? Anemia   ? IDA  ? Anxiety   ? Depression   ? Hernia of abdominal wall   ? History of drug abuse (HCC)   ? has not used in ten years  ? Substance abuse (HCC)   ? 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 Transfusion history   ? due to anemia with hb 4.0  ? ? ?Current Outpatient Medications on File Prior to Visit  ?Medication Sig Dispense Refill  ? diclofenac Sodium (VOLTAREN) 1 % GEL Apply 4 g topically 4 (four) times daily. 150 g 1  ? ferrous sulfate 325 (65 FE) MG tablet Take 1 tablet (325 mg total) by mouth daily with breakfast. (Patient not taking: No sig reported) 90 tablet 0  ? hydrOXYzine (ATARAX/VISTARIL) 10 MG tablet Take 2.5 tablets (25 mg total) by mouth every 8 (eight) hours as needed. 30 tablet 2  ? methadone (DOLOPHINE) 10 MG/5ML solution Take 37 mg by mouth every morning.    ? polyethylene glycol (MIRALAX) 17 g packet Take 17 g by mouth daily. 30 packet 2  ? [DISCONTINUED] famotidine (PEPCID) 20 MG tablet Take 1 tablet (20 mg total) by mouth 2 (two) times daily. (Patient not taking: Reported on 09/03/2019) 30 tablet 0  ? ?No current facility-administered medications on file prior to visit.  ? ? ?Family History  ?Problem Relation Age of Onset  ? Cancer Mother   ? Cancer Father   ? Colon cancer Neg Hx   ? Colon polyps Neg Hx   ? Esophageal cancer Neg Hx   ? Rectal cancer Neg Hx   ? Stomach cancer Neg Hx   ? ? ?Social History  ? ?Socioeconomic History  ? Marital status: Single  ?  Spouse name: Not on file  ? Number of children: Not on file  ? Years of education: Not on file  ? Highest education level: Not on file  ?Occupational History  ? Not on file  ?Tobacco Use  ? Smoking status: Every Day  ?  Packs/day: 0.25  ?  Types: Cigarettes  ? Smokeless  tobacco: Never  ? Tobacco comments:  ?  4-5  cigarettes per day.   Wants to stop  ?Vaping Use  ? Vaping Use: Never used  ?Substance and Sexual Activity  ? Alcohol use: Not Currently  ?  Comment: 2012  ? Drug use: Not Currently  ?  Types: Heroin, Marijuana  ?  Comment: last time used drugs was 2012  ? Sexual activity: Not Currently  ?  Birth control/protection: Surgical  ?Other Topics Concern  ? Not on file  ?Social History Narrative  ? Not on file  ? ?Social Determinants of Health  ? ?Financial Resource Strain: Not on file  ?Food Insecurity: Not on file  ?Transportation Needs: Not on file  ?Physical Activity: Not on file  ?Stress: Not on file  ?Social Connections: Not on file  ?Intimate Partner Violence: Not on file  ? ? ?Review of Systems: ?ROS negative except for what is noted on the assessment and plan. ? ?Vitals:  ? 11/26/21 1003  ?BP: 121/75  ?Pulse: 88  ?Temp: 98.6 ?F (37 ?C)  ?TempSrc: Oral  ?SpO2: 98%  ?Weight: 252 lb 3.2 oz (114.4  kg)  ?Height: 5\' 4"  (1.626 m)  ? ? ? ?Physical Exam: ?Constitutional: alert, well-appearing, in NAD ?HENT: normocephalic, atraumatic, mucous membranes moist ?Eyes: conjunctiva non-erythematous, EOMI ?Cardiovascular: RRR, no m/r/g, non-edematous bilateral LE ?Pulmonary/Chest: normal work of breathing on RA, LCTAB ?Abdominal: soft, non-tender to palpation, non-distended ?MSK: normal bulk and tone  ?Neurological: A&O x 3 and follows commands  ?Skin: warm and dry  ?Psych: normal behavior, normal affect  ? ? ?Assessment & Plan:  ? ?See Encounters Tab for problem based charting. ? ?Patient discussed with Dr. ? ?Heide Spark, MD  ?Internal Medicine Resident, PGY-1 ?[facility]  ?

## 2021-11-26 NOTE — Patient Instructions (Signed)
Please continue taking Paroxetine 30 mg for 2 months, and then start taking paroxetine 20 for 2 months, and paroxetine 10 for the last 2 months.  ?

## 2021-11-26 NOTE — Assessment & Plan Note (Addendum)
Here today for further evaluation and management of GAD. She is on paroxetine 30 mg daily and Atarax 25 mg as needed for panic attacks. PHQ 6 today. She is here today as she is interested in stopping paroxetine however she afraid of discontinuing over the course of 1 month and would like to taper off over 6 months. She wants to stop because she reports taking it for years and believes "she needs to get off" and that her "methadone is interfering with paroxetine". Exam is benign but anxiety is obviously uncontrolled as pt is increasingly anxious. She does complain of occasional palpitations but no chest pain. EKG today showing NSR. Made plan with pt to titrate off of paroxetine by taking 30 mg daily for 2 months, followed by 20 mg daily for 2 months, and then 10 mg daily for 2 months. She is interested in establishing with psychiatry given years of uncontrolled anxiety; I think this is appropriate at this time.  ? ?-Titrate off of paroxetine as above ?-Continue Atarax as needed  ?-Continue visits with Dr Monna Fam ?-Psychiatry referral placed.  ? ? ?

## 2021-11-29 NOTE — Progress Notes (Signed)
Internal Medicine Clinic Attending  Case discussed with Dr. Patel  At the time of the visit.  We reviewed the resident's history and exam and pertinent patient test results.  I agree with the assessment, diagnosis, and plan of care documented in the resident's note.  

## 2021-12-14 ENCOUNTER — Telehealth: Payer: Self-pay | Admitting: Behavioral Health

## 2021-12-14 ENCOUNTER — Institutional Professional Consult (permissible substitution): Payer: Medicare Other | Admitting: Behavioral Health

## 2021-12-14 NOTE — Telephone Encounter (Signed)
Unable to reach Pt for 10:00am telehealth visit. Phone not allowing msg due to Mbox being full. Alerted Pt to this earlier.  ? ?Dr. Theodis Shove ?

## 2021-12-20 ENCOUNTER — Other Ambulatory Visit: Payer: Self-pay

## 2021-12-20 ENCOUNTER — Ambulatory Visit (INDEPENDENT_AMBULATORY_CARE_PROVIDER_SITE_OTHER): Payer: Medicare Other | Admitting: Internal Medicine

## 2021-12-20 ENCOUNTER — Telehealth: Payer: Self-pay | Admitting: Internal Medicine

## 2021-12-20 DIAGNOSIS — F411 Generalized anxiety disorder: Secondary | ICD-10-CM

## 2021-12-20 NOTE — Assessment & Plan Note (Addendum)
Tele-health encounter for question about medication. Pt reports feeling better when she was taking paroxetine 20 mg rather than 30 mg and is asking if it would be appropriate to start taking 20 mg instead of 30 mg. She reports feeling more anxious and "on edge" since starting the 30 mg (07/2021), and believes that it is interacting with her methadone. Paranoia surrounding medication has been ongoing for ~6 months at this point, and unfortunately despite several efforts by several different providers, she continues to have irrational beliefs surrounding medication, including her "body is metabolizing medication" too soon. During visit 3 weeks ago, we came up with a plan to taper off of paroxetine over the course of 6 mo as she was hesitant with tapering off over a shorter timeline. At this time, she would like to continue on paroxetine 20 mg. Suspect uncontrolled GAD is contributing to these thoughts and beleifs, although pt states that she has noticed an improvement in anxiety over the past several months (I.e since up-titrating to 30 mg). Will proceed with titrating down to 20 mg daily at this time given that this remains a stressor for pt. Continue f/u with Dr Theodis Shove. Remains agreeable to establishing with psychiatry; referral placed during prior visit 3 weeks ago.  ?

## 2021-12-20 NOTE — Patient Instructions (Signed)
Start taking Paroxetine 20 mg daily and discontinue 30 mg.  ? ?Follow up for anxiety in 3 months  ?

## 2021-12-20 NOTE — Telephone Encounter (Signed)
Pt reporting since starting the following the medications she has been noticing, low heart rate, tightness in her chest and legs.  Pt states she has been taking it for 1- 2 months and is really concerned about the side effects since the change from 20 mg to 30 mg and would like for a nurse to give her a call back. ? ?PARoxetine (PAXIL) 30 MG tablet ?

## 2021-12-20 NOTE — Progress Notes (Signed)
? ?  CC: follow up tele-health encounter for uncontrolled GAD [no charge] ? ?This is a telephone encounter between Paula Leon and [facility] on 12/20/2021 for stated above. The visit was conducted with the patient located at home and [facility] at [facility]. The patient's identity was confirmed using their DOB and current address. The patient has consented to being evaluated through a telephone encounter and understands the associated risks (an examination cannot be done and the patient may need to come in for an appointment) / benefits (allows the patient to remain at home, decreasing exposure to coronavirus). I personally spent 10 minutes on medical discussion.  ? ?HPI: ? ?[name] is a 56 y.o. with PMH as below.  ? ?Please see A&P for assessment of the patient's acute and chronic medical conditions.  ? ?Past Medical History:  ?Diagnosis Date  ? Anemia   ? IDA  ? Anxiety   ? Depression   ? Hernia of abdominal wall   ? History of drug abuse (HCC)   ? has not used in ten years  ? Substance abuse (HCC)   ? 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 Transfusion history   ? due to anemia with hb 4.0  ? ?Review of Systems:  ROS negative except for what is noted on the assessment and plan ? ? ?Assessment & Plan:  ? ?See Encounters Tab for problem based charting. ? ?Patient discussed with Dr. Heide Spark ? ?[facility], MD ?Internal Medicine Resident, PGY-1 ?Internal Medicine Clinic  ?

## 2021-12-22 NOTE — Progress Notes (Signed)
Internal Medicine Clinic Attending  Case discussed with Dr. Patel  At the time of the visit.  We reviewed the resident's history and exam and pertinent patient test results.  I agree with the assessment, diagnosis, and plan of care documented in the resident's note.  

## 2022-01-06 ENCOUNTER — Telehealth: Payer: Self-pay | Admitting: Internal Medicine

## 2022-01-06 NOTE — Telephone Encounter (Signed)
Pt requesting a call back about tapering off of the following medication: ? ? ?PARoxetine (PAXIL) 20 MG tablet ? ? ?Pt states she feels very uncomfortable with taking the methadone along with the current medication.  Pt states she feels very funny and needs to help as soon as possible. ?

## 2022-01-06 NOTE — Telephone Encounter (Signed)
Return pt's call - stated when she takes Paxil at night she feels ok,;relaxed ,no anxiety but when she takes Methadone in the morning; she feels very anxious as if she's having a panic attack. Stated she is not depressed. She's unsure if the combination is making her feel this way. She down to 10 mg of Paxil; and 35 mg of Methadone. She's wanting to know if Paxil can be tapered to 5 mg then discontinued. No available appts this week ; agreed to come in on Monday. Appt schedule w/Dr Aslam @ 0945 AM 01/10/22. ?

## 2022-01-10 ENCOUNTER — Ambulatory Visit (INDEPENDENT_AMBULATORY_CARE_PROVIDER_SITE_OTHER): Payer: Medicare Other | Admitting: Internal Medicine

## 2022-01-10 DIAGNOSIS — F411 Generalized anxiety disorder: Secondary | ICD-10-CM | POA: Diagnosis present

## 2022-01-10 MED ORDER — PAROXETINE HCL 10 MG PO TABS: 10.0000 mg | ORAL_TABLET | Freq: Every day | ORAL | 0 refills | Status: DC

## 2022-01-10 NOTE — Progress Notes (Signed)
Internal Medicine Clinic Attending ° °Case discussed with Dr. Aslam  At the time of the visit.  We reviewed the resident’s history and exam and pertinent patient test results.  I agree with the assessment, diagnosis, and plan of care documented in the resident’s note.  °

## 2022-01-10 NOTE — Progress Notes (Signed)
?  [facility] Telephone Encounter ?Continuity Care Appointment ? ?HPI:  ?This telephone encounter was created for [name] on 01/10/2022 for the following purpose/cc anti-anxiety medication adjustment.  ? ?Past Medical History:  ?Past Medical History:  ?Diagnosis Date  ? Anemia   ? IDA  ? Anxiety   ? Depression   ? Hernia of abdominal wall   ? History of drug abuse (HCC)   ? has not used in ten years  ? Substance abuse (HCC)   ? 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 Transfusion history   ? due to anemia with hb 4.0  ?  ? ?ROS:  ?Negative except as stated in HPI  ? ?Assessment / Plan / Recommendations:  ?Please see A&P under problem oriented charting for assessment of the patient's acute and chronic medical conditions.  ?As always, pt is advised that if symptoms worsen or new symptoms arise, they should go to an urgent care facility or to to ER for further evaluation.  ? ?Consent and Medical Decision Making:  ?Patient discussed with Dr. Heide Spark ?This is a telephone encounter between Paula Leon and Paula Leon on 01/10/2022 for anti-anxiety medication adjustment. The visit was conducted with the patient located at home and [facility] at [facility]. The patient's identity was confirmed using their DOB and current address. The patient has consented to being evaluated through a telephone encounter and understands the associated risks (an examination cannot be done and the patient may need to come in for an appointment) / benefits (allows the patient to remain at home, decreasing exposure to coronavirus). I personally spent 10 minutes on medical discussion.   ? ?

## 2022-01-10 NOTE — Assessment & Plan Note (Addendum)
Ms Paula Leon has a history of generalized anxiety disorder for which she has been on paroxetine for approximately 8 years. She is also on methadone for opioid dependence. Patient reports increased anxiety on paroxetine 20mg  daily. She reports that she was more anxious and on edge when on 30mg  daily that had improved when she went down to 20mg  daily. However, reports that she is still having anxiety on the 20mg  daily. Patient reports taking her paroxetine at night and it "brings me up" and she doesn't feel like she is getting a restful sleep. She reports that her anxiety is worsened when she takes methadone. She believes that her paroxetine is contributing to her anxiety. She has previously been referred to psychiatry for anxiety management; however, has not been able to follow up yet. She does report taking hydroxyzine 10mg  as needed (has had to take it 3-4 times over the past month).  ?She would like to trial on lower dose of paroxetine at this time. Appears that during previous encounters, patient was offered to taper off her paroxetine over the course of 6 months. Will continue with this taper.  ? ?- Decrease paroxetine to 10mg  daily ?- Follow up in 4-6 weeks ?- If anxiety is persistent/worsening, can consider repeating thyroid studies (TSH nl 03/2021) ?- Encouraged to follow up with psychiatry ? ?

## 2022-01-25 ENCOUNTER — Encounter: Payer: Medicare Other | Admitting: Internal Medicine

## 2022-02-01 ENCOUNTER — Encounter: Payer: Medicare Other | Admitting: Internal Medicine

## 2022-02-01 ENCOUNTER — Encounter (HOSPITAL_COMMUNITY): Payer: Self-pay

## 2022-02-01 ENCOUNTER — Other Ambulatory Visit: Payer: Self-pay

## 2022-02-01 ENCOUNTER — Emergency Department (HOSPITAL_COMMUNITY)
Admission: EM | Admit: 2022-02-01 | Discharge: 2022-02-02 | Disposition: A | Discharge status: Home / Self Care | Payer: Medicare Other | Attending: Emergency Medicine | Admitting: Emergency Medicine

## 2022-02-01 DIAGNOSIS — Z5321 Procedure and treatment not carried out due to patient leaving prior to being seen by health care provider: Secondary | ICD-10-CM | POA: Diagnosis not present

## 2022-02-01 DIAGNOSIS — F419 Anxiety disorder, unspecified: Secondary | ICD-10-CM | POA: Insufficient documentation

## 2022-02-01 DIAGNOSIS — R002 Palpitations: Secondary | ICD-10-CM | POA: Diagnosis present

## 2022-02-01 LAB — BASIC METABOLIC PANEL
Anion gap: 7 (ref 5–15)
BUN: 13 mg/dL (ref 6–20)
CO2: 24 mmol/L (ref 22–32)
Calcium: 9.3 mg/dL (ref 8.9–10.3)
Chloride: 106 mmol/L (ref 98–111)
Creatinine, Ser: 0.93 mg/dL (ref 0.44–1.00)
GFR, Estimated: >60 mL/min (ref >60)
Glucose, Bld: 95 mg/dL (ref 70–99)
Potassium: 3.8 mmol/L (ref 3.5–5.1)
Sodium: 137 mmol/L (ref 135–145)

## 2022-02-01 LAB — CBC
HCT: 34 % — ABNORMAL LOW (ref 36.0–46.0)
Hemoglobin: 10.1 g/dL — ABNORMAL LOW (ref 12.0–15.0)
MCH: 21.9 pg — ABNORMAL LOW (ref 26.0–34.0)
MCHC: 29.7 g/dL — ABNORMAL LOW (ref 30.0–36.0)
MCV: 73.8 fL — ABNORMAL LOW (ref 80.0–100.0)
Platelets: 197 10*3/uL (ref 150–400)
RBC: 4.61 MIL/uL (ref 3.87–5.11)
RDW: 17.1 % — ABNORMAL HIGH (ref 11.5–15.5)
WBC: 6.2 10*3/uL (ref 4.0–10.5)
nRBC: 0 % (ref 0.0–0.2)

## 2022-02-01 NOTE — ED Provider Triage Note (Addendum)
Emergency Medicine Provider Triage Evaluation Note ? ?Paula Leon , a 56 y.o. female  was evaluated in triage.  Pt complains of heart palpitations and anxiety. ?She attributes this to being on paxil and methadone.  ? ?Denies and CP, SOB, fever, chills, NV, LH, vertigo, fatigue.  ? ?Review of Systems  ?Positive: Anxiety, palpitations ?Negative: Fever  ? ?Physical Exam  ?BP 134/75   Pulse 83   Temp 98.3 ?F (36.8 ?C) (Oral)   Resp 18   SpO2 97%  ?Gen:   Awake, no distress   ?Resp:  Normal effort  ?MSK:   Moves extremities without difficulty  ?Other:  RRR, no MRG ? ?Medical Decision Making  ?Medically screening exam initiated at 8:48 PM.  Appropriate orders placed.  Paula Leon was informed that the remainder of the evaluation will be completed by another provider, this initial triage assessment does not replace that evaluation, and the importance of remaining in the ED until their evaluation is complete. ? ?EKG, labs (TSH normal within 1 year) ?  ?Gailen Shelter, [place] ?02/01/22 2050 ? ?  ?Gailen Shelter, [place] ?02/01/22 2051 ? ?

## 2022-02-01 NOTE — ED Notes (Signed)
Pt name called again for updated vitals, still no response 

## 2022-02-01 NOTE — ED Notes (Signed)
Pt name called for updated vitals, no respones ?

## 2022-02-01 NOTE — ED Triage Notes (Signed)
Pt reports she is here today because she feels anxious . Pt reports she feels like her heart is racing  ?

## 2022-02-02 ENCOUNTER — Encounter: Payer: Self-pay | Admitting: Internal Medicine

## 2022-02-02 ENCOUNTER — Ambulatory Visit (INDEPENDENT_AMBULATORY_CARE_PROVIDER_SITE_OTHER): Payer: Medicare Other | Admitting: Internal Medicine

## 2022-02-02 DIAGNOSIS — F411 Generalized anxiety disorder: Secondary | ICD-10-CM | POA: Diagnosis present

## 2022-02-02 MED ORDER — DULOXETINE HCL 30 MG PO CPEP: 30.0000 mg | ORAL_CAPSULE | Freq: Every day | ORAL | 0 refills | Status: DC

## 2022-02-02 MED ORDER — BUSPIRONE HCL 5 MG PO TABS: 5.0000 mg | ORAL_TABLET | Freq: Two times a day (BID) | ORAL | 0 refills | Status: DC

## 2022-02-02 NOTE — Assessment & Plan Note (Addendum)
Patient has been on paroxetine for the past 8 years.  For the past year she has been complaining of continued anxiety episodes.  Her episodes consist of feeling impending doom, tremor, sweating and palpitations.  Most recently, yesterday, patient presented to the ED for anxiety attack, she was seen by triage however left shortly after.  Prior office visit, she has been tapered off paroxetine, currently taking 10 mg daily.  The tapering began several months ago when patient believed that the medication was worsening her symptoms.  Recent stressors in her life include her son suffering from anxiety which resulted in her experiencing anxiety. She attends AA meeting daily. She feels she has been not supported there.  She speaks with her sponsor to try to work through her emotions, however she still ends up experiencing anxiety.  She had concerns that her eating habits of recently could be contributing to her attacks.  Yesterday she had 2 cups of coffee and a bag of candy and several hours later she experienced an anxiety attack.  She believes with the caffeine and sugar likely caused her to have a lot of energy buildup.  She also endorsed for the last couple days she has been waking up with palpitations and do not feel well rested in the morning.  She is interested in discontinuing paroxetine and considering a different medication.  On exam, she appeared anxious and was pacing around the room and requesting to drink lots of water. Blood pressure was elevated.  Tachycardia noted on CV exam, lungs were clear to auscultation.  She was able to calm down after extended discussion and Dr. Sallyanne Kuster joined the visit.  Patient denied SI or HI.  Expressing she just feels very overwhelmed.  After extended discussion regarding her feelings about initiating a new medication.  She has agreed to discontinue paroxetine.  And begin duloxetine and BuSpar.  She is to continue hydroxyzine as needed for acute anxiety attacks.  She was also  informed the benefit of CBT in conjunction with pharmacological therapy.  She is agreeable to in-person therapy sessions. ? ? ? ?-Start at the lowest dose of duloxetine, to avoid side effect profile and titrate up as tolerated.  ?-Start buspirone 5 mg twice daily ?-Patient also takes hydroxyzine intermittently for acute episodes.   ?-Schedule in person therapy session with Dr. Monna Fam; assistance greatly appreciated ?-Follow-up in 4 weeks ?-She was advised to call the office or go to the ED if her symptoms warrant medical intervention ?

## 2022-02-02 NOTE — Progress Notes (Signed)
? ?CC: Anxiety attack ? ?HPI: ? ?Paula Leon is a 56 y.o. female with a past medical history stated below and presents today for CC listed above. Please see problem based assessment and plan for additional details. ? ?Past Medical History:  ?Diagnosis Date  ? Anemia   ? IDA  ? Anxiety   ? Depression   ? Hernia of abdominal wall   ? History of drug abuse (HCC)   ? has not used in ten years  ? Substance abuse (HCC)   ? 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 Transfusion history   ? due to anemia with hb 4.0  ? ? ?Current Outpatient Medications on File Prior to Visit  ?Medication Sig Dispense Refill  ? diclofenac Sodium (VOLTAREN) 1 % GEL Apply 4 g topically 4 (four) times daily. 150 g 1  ? ferrous sulfate 325 (65 FE) MG tablet Take 1 tablet (325 mg total) by mouth daily with breakfast. (Patient not taking: No sig reported) 90 tablet 0  ? hydrOXYzine (ATARAX/VISTARIL) 10 MG tablet Take 2.5 tablets (25 mg total) by mouth every 8 (eight) hours as needed. 30 tablet 2  ? methadone (DOLOPHINE) 10 MG/5ML solution Take 37 mg by mouth every morning.    ? [DISCONTINUED] famotidine (PEPCID) 20 MG tablet Take 1 tablet (20 mg total) by mouth 2 (two) times daily. (Patient not taking: Reported on 09/03/2019) 30 tablet 0  ? ?No current facility-administered medications on file prior to visit.  ? ? ?Family History  ?Problem Relation Age of Onset  ? Cancer Mother   ? Cancer Father   ? Colon cancer Neg Hx   ? Colon polyps Neg Hx   ? Esophageal cancer Neg Hx   ? Rectal cancer Neg Hx   ? Stomach cancer Neg Hx   ? ? ?Social History  ? ?Socioeconomic History  ? Marital status: Single  ?  Spouse name: Not on file  ? Number of children: Not on file  ? Years of education: Not on file  ? Highest education level: Not on file  ?Occupational History  ? Not on file  ?Tobacco Use  ? Smoking status: Every Day  ?  Packs/day: 0.25  ?  Types: Cigarettes  ? Smokeless tobacco: Never  ? Tobacco comments:  ?  4-5  cigarettes per day.   Wants to stop  ?Vaping Use  ? Vaping  Use: Never used  ?Substance and Sexual Activity  ? Alcohol use: Not Currently  ?  Comment: 2012  ? Drug use: Not Currently  ?  Types: Heroin, Marijuana  ?  Comment: last time used drugs was 2012  ? Sexual activity: Not Currently  ?  Birth control/protection: Surgical  ?Other Topics Concern  ? Not on file  ?Social History Narrative  ? Not on file  ? ?Social Determinants of Health  ? ?Financial Resource Strain: Not on file  ?Food Insecurity: Not on file  ?Transportation Needs: Not on file  ?Physical Activity: Not on file  ?Stress: Not on file  ?Social Connections: Not on file  ?Intimate Partner Violence: Not on file  ? ? ?Review of Systems: ?ROS negative except for what is noted on the assessment and plan. ? ?Vitals:  ? 02/02/22 0945 02/02/22 1023  ?BP: (!) 147/93 (!) 176/101  ?Pulse: (!) 113 (!) 106  ?SpO2: 100%   ?Weight: 248 lb 4.8 oz (112.6 kg)   ?Height: 5\' 4"  (1.626 m)   ? ? ? ?Physical Exam: ?Constitutional: Anxious-appearing woman pacing back-and-forth in  the room  ?HENT: normocephalic atraumatic, mucous membranes moist ?Eyes: conjunctiva non-erythematous ?Neck: supple ?Cardiovascular: Tachycardic and regular rhythm, no m/r/g ?Pulmonary/Chest: normal work of breathing on room air, lungs clear to auscultation bilaterally ?Abdominal: soft, non-tender, non-distended ?MSK: normal bulk and tone ?Neurological: alert & oriented x 3, 5/5 strength in bilateral upper and lower extremities, normal gait ?Skin: warm and dry ?Psych: Anxious mood ? ? ?Assessment & Plan:  ? ?See Encounters Tab for problem based charting. ? ?Patient discussed with Dr. Sol Blazing ?Dellis Filbert, M.D. ?[facility] Internal Medicine, PGY-1 ?Pager: [phone], Phone: [phone] ?Date 02/02/2022 Time 12:19 PM  ?

## 2022-02-02 NOTE — Patient Instructions (Signed)
Thank you, Ms.Leandra Kern for allowing [place] to provide your care today. Today we discussed anxiety.   ? ?I have ordered the following labs for you: ? ?Lab Orders    ?     TSH    ?  ? ?Tests ordered today: ? ?None ? ?Referrals ordered today:  ? ?Referral Orders  ?No referral(s) requested today  ?  ? ?I have ordered the following medication/changed the following medications:  ? ?Stop the following medications: ?Medications Discontinued During This Encounter  ?Medication Reason  ? PARoxetine (PAXIL) 10 MG tablet   ?  ? ?Start the following medications: ?Meds ordered this encounter  ?Medications  ? busPIRone (BUSPAR) 5 MG tablet  ?  Sig: Take 1 tablet (5 mg total) by mouth 2 (two) times daily.  ?  Dispense:  180 tablet  ?  Refill:  0  ? DULoxetine (CYMBALTA) 30 MG capsule  ?  Sig: Take 1 capsule (30 mg total) by mouth daily.  ?  Dispense:  90 capsule  ?  Refill:  0  ?  ? ?Follow up: 4 weeks  ? ?Remember: Stop taking the Paroxetine; Start the buspar twice daily and duloxetine once daily ? ?Should you have any questions or concerns please call the internal medicine clinic at [phone].   ? ?Timothy Lasso, MD ?[place] ?  ?

## 2022-02-02 NOTE — ED Notes (Signed)
Pt named called a third time for vitals, no response  ?

## 2022-02-03 NOTE — Progress Notes (Signed)
Internal Medicine Clinic Attending ? ?Case discussed with Dr. Ariwodo  At the time of the visit.  We reviewed the resident?s history and exam and pertinent patient test results.  I agree with the assessment, diagnosis, and plan of care documented in the resident?s note.  ?

## 2022-02-03 NOTE — Addendum Note (Signed)
Addended by: Dellis Filbert I on: 02/03/2022 08:44 AM ? ? Modules accepted: Orders ? ?

## 2022-02-17 ENCOUNTER — Ambulatory Visit (HOSPITAL_COMMUNITY): Payer: Medicare Other | Admitting: Clinical

## 2022-02-21 ENCOUNTER — Other Ambulatory Visit: Payer: Self-pay

## 2022-02-21 ENCOUNTER — Encounter (HOSPITAL_COMMUNITY): Payer: Self-pay | Admitting: Emergency Medicine

## 2022-02-21 ENCOUNTER — Emergency Department (HOSPITAL_COMMUNITY)
Admission: EM | Admit: 2022-02-21 | Discharge: 2022-02-21 | Disposition: A | Discharge status: Home / Self Care | Payer: Medicare Other | Attending: Emergency Medicine | Admitting: Emergency Medicine

## 2022-02-21 DIAGNOSIS — R002 Palpitations: Secondary | ICD-10-CM | POA: Diagnosis present

## 2022-02-21 DIAGNOSIS — F17210 Nicotine dependence, cigarettes, uncomplicated: Secondary | ICD-10-CM | POA: Diagnosis not present

## 2022-02-21 LAB — BASIC METABOLIC PANEL
Anion gap: 6 (ref 5–15)
BUN: 11 mg/dL (ref 6–20)
CO2: 26 mmol/L (ref 22–32)
Calcium: 9.1 mg/dL (ref 8.9–10.3)
Chloride: 106 mmol/L (ref 98–111)
Creatinine, Ser: 0.84 mg/dL (ref 0.44–1.00)
GFR, Estimated: >60 mL/min (ref >60)
Glucose, Bld: 96 mg/dL (ref 70–99)
Potassium: 4 mmol/L (ref 3.5–5.1)
Sodium: 138 mmol/L (ref 135–145)

## 2022-02-21 LAB — TROPONIN I (HIGH SENSITIVITY): Troponin I (High Sensitivity): <2 ng/L (ref <18)

## 2022-02-21 LAB — CBC
HCT: 34.9 % — ABNORMAL LOW (ref 36.0–46.0)
Hemoglobin: 10.5 g/dL — ABNORMAL LOW (ref 12.0–15.0)
MCH: 22.4 pg — ABNORMAL LOW (ref 26.0–34.0)
MCHC: 30.1 g/dL (ref 30.0–36.0)
MCV: 74.6 fL — ABNORMAL LOW (ref 80.0–100.0)
Platelets: 227 10*3/uL (ref 150–400)
RBC: 4.68 MIL/uL (ref 3.87–5.11)
RDW: 18.1 % — ABNORMAL HIGH (ref 11.5–15.5)
WBC: 4 10*3/uL (ref 4.0–10.5)
nRBC: 0 % (ref 0.0–0.2)

## 2022-02-21 LAB — I-STAT BETA HCG BLOOD, ED (MC, WL, AP ONLY): I-stat hCG, quantitative: <5 m[IU]/mL (ref <5)

## 2022-02-21 NOTE — ED Provider Notes (Signed)
[facility] Emergency Department Provider Note MRN:  [id number]  Arrival date & time: 02/21/22     Chief Complaint   Palpitations   History of Present Illness   Paula Leon is a 56 y.o. year-old female with no pertinent past medical history presenting to the ED with chief complaint of palpitations.  Palpitations this evening when trying to sleep.  Has had palpitations on and off for years.  Increased rest recently, patient's son is on a psychiatric hold currently.  Review of Systems  A thorough review of systems was obtained and all systems are negative except as noted in the HPI and PMH.   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   Transfusion history    due to anemia with hb 4.0    Past Surgical History:  Procedure Laterality Date   INSERTION OF MESH N/A 04/19/2019   Procedure: INSERTION OF MESH;  Surgeon: Darnell LevelGerkin, Todd, MD;  Location: [facility];  Service: General;  Laterality: N/A;   SALPINGECTOMY Left    due to ruptured ectopic   VENTRAL HERNIA REPAIR N/A 04/19/2019   Procedure: VENTRAL HERNIA REPAIR;  Surgeon: Darnell LevelGerkin, Todd, MD;  Location: [facility];  Service: General;  Laterality: N/A;    Family History  Problem Relation Age of Onset   Cancer Mother    Cancer Father    Colon cancer Neg Hx    Colon polyps Neg Hx    Esophageal cancer Neg Hx    Rectal cancer Neg Hx    Stomach cancer Neg Hx     Social History   Socioeconomic History   Marital status: Single    Spouse name: Not on file   Number of children: Not on file   Years of education: Not on file   Highest education level: Not on file  Occupational History   Not on file  Tobacco Use   Smoking status: Every Day    Packs/day: 0.25    Types: Cigarettes    Smokeless tobacco: Never   Tobacco comments:    4-5  cigarettes per day.   Wants to stop  Vaping Use   Vaping Use: Never used  Substance and Sexual Activity   Alcohol use: Not Currently    Comment: 2012   Drug use: Not Currently    Types: Heroin, Marijuana    Comment: last time used drugs was 2012   Sexual activity: Not Currently    Birth control/protection: Surgical  Other Topics Concern   Not on file  Social History Narrative   Not on file   Social Determinants of Health   Financial Resource Strain: Not on file  Food Insecurity: Not on file  Transportation Needs: Not on file  Physical Activity: Not on file  Stress: Not on file  Social Connections: Not on file  Intimate Partner Violence: Not on file     Physical Exam   Vitals:   02/21/22 0529  BP: 111/88  Pulse: 91  Resp: 16  Temp: 98 F (36.7 C)  SpO2: 100%    CONSTITUTIONAL: Well-appearing, NAD NEURO/PSYCH:  Alert and oriented x 3, no focal deficits EYES:  eyes equal and reactive  ENT/NECK:  no LAD, no JVD CARDIO: Regular rate, well-perfused, normal S1 and S2 PULM:  CTAB no wheezing or rhonchi GI/GU:  non-distended, non-tender MSK/SPINE:  No gross deformities, no edema SKIN:  no rash, atraumatic   *Additional and/or pertinent findings included in MDM below  Diagnostic and Interventional Summary    EKG Interpretation  Date/Time:  Monday Feb 21 2022 05:26:50 EDT Ventricular Rate:  87 PR Interval:  154 QRS Duration: 80 QT Interval:  340 QTC Calculation: 409 R Axis:   72 Text Interpretation: Normal sinus rhythm Normal ECG When compared with ECG of 01-Feb-2022 21:08, PREVIOUS ECG IS PRESENT Confirmed by Kennis Carina [phone]) on 02/21/2022 5:43:03 AM       Labs Reviewed  CBC - Abnormal; Notable for the following components:      Result Value   Hemoglobin 10.5 (*)    HCT 34.9 (*)    MCV 74.6 (*)    MCH 22.4 (*)    RDW 18.1 (*)    All other components within normal limits  BASIC METABOLIC  PANEL  I-STAT BETA HCG BLOOD, ED ([facility], [facility], [facility] ONLY)  TROPONIN I (HIGH SENSITIVITY)    No orders to display    Medications - No data to display   Procedures  /  Critical Care .1-3 Lead EKG Interpretation Performed by: Sabas Sous, MD Authorized by: Sabas Sous, MD     Interpretation: normal     ECG rate:  80s   ECG rate assessment: normal     Rhythm: sinus rhythm     Ectopy: none     Conduction: normal   Comments:     Cardiac monitoring was ordered to monitor the patient for dysrhythmia.  I personally interpreted the patient's cardiac monitor while at the bedside.    ED Course and Medical Decision Making  Initial Impression and Ddx Palpitations, no chest pain, normal vital signs, more of a chronic issue.  EKG is reassuring, cardiac monitoring is showing sinus rhythm with no ectopy.  Screening labs are normal, has good follow-up with possible cardiac monitor at home being set up soon.  No signs of emergent process, appropriate for discharge.  Past medical/surgical history that increases complexity of ED encounter: None  Interpretation of Diagnostics I personally reviewed the EKG and my interpretation is as follows: Sinus rhythm    Labs reveal no significant blood count or electrolyte disturbance  Patient Reassessment and Ultimate Disposition/Management Discharge home  Patient management required discussion with the following services or consulting groups:  None  Complexity of Problems Addressed Acute illness or injury that poses threat of life of bodily function  Additional Data Reviewed and Analyzed Further history obtained from: None  Additional Factors Impacting ED Encounter Risk None  Elmer Sow. Pilar Plate, MD [facility] Emergency Medicine [facility] [email or web address]  Final Clinical Impressions(s) / ED Diagnoses     ICD-10-CM   1. Palpitations  R00.2       ED Discharge Orders     None        Discharge Instructions Discussed  with and Provided to Patient:    Discharge Instructions      You were evaluated in the Emergency Department and after careful evaluation, we did not find any emergent condition requiring admission or further testing in the hospital.  Your exam/testing today was overall reassuring.  Recommend continued follow-up with your regular doctors.  Please return to the Emergency Department if you experience any worsening of your condition.  Thank  you for allowing [place] to be a part of your care.       Sabas Sous, MD 02/21/22 [id number]

## 2022-02-21 NOTE — Discharge Instructions (Signed)
You were evaluated in the Emergency Department and after careful evaluation, we did not find any emergent condition requiring admission or further testing in the hospital.  Your exam/testing today was overall reassuring.  Recommend continued follow-up with your regular doctors.  Please return to the Emergency Department if you experience any worsening of your condition.  Thank you for allowing us to be a part of your care.  

## 2022-02-21 NOTE — ED Triage Notes (Signed)
Pt presents to ED with c/o palpitations since around midnight. Pt states it worsens when she falls asleep. Has hx of anxiety, states she was on Paxil but was taken off d/t increases palpitations possibly caused by interaction with methadone. Pt has rapid and pressured speech and seems "worried" at time of triage.

## 2022-02-21 NOTE — ED Notes (Signed)
The pt left without her  discharge instruction while the nurse was getting them

## 2022-02-23 ENCOUNTER — Telehealth: Payer: Self-pay

## 2022-02-23 ENCOUNTER — Emergency Department (HOSPITAL_COMMUNITY)
Admission: EM | Admit: 2022-02-23 | Discharge: 2022-02-23 | Discharge status: Against Medical Advice | Payer: Medicare Other | Attending: Student | Admitting: Student

## 2022-02-23 ENCOUNTER — Encounter: Payer: Self-pay | Admitting: Internal Medicine

## 2022-02-23 ENCOUNTER — Other Ambulatory Visit: Payer: Self-pay

## 2022-02-23 ENCOUNTER — Encounter (HOSPITAL_COMMUNITY): Payer: Self-pay | Admitting: Emergency Medicine

## 2022-02-23 ENCOUNTER — Encounter: Payer: Medicare Other | Admitting: Internal Medicine

## 2022-02-23 DIAGNOSIS — R0602 Shortness of breath: Secondary | ICD-10-CM | POA: Diagnosis not present

## 2022-02-23 DIAGNOSIS — Z5321 Procedure and treatment not carried out due to patient leaving prior to being seen by health care provider: Secondary | ICD-10-CM | POA: Diagnosis not present

## 2022-02-23 DIAGNOSIS — R079 Chest pain, unspecified: Secondary | ICD-10-CM | POA: Insufficient documentation

## 2022-02-23 DIAGNOSIS — R002 Palpitations: Secondary | ICD-10-CM | POA: Diagnosis not present

## 2022-02-23 LAB — CBC
HCT: 33.8 % — ABNORMAL LOW (ref 36.0–46.0)
Hemoglobin: 9.9 g/dL — ABNORMAL LOW (ref 12.0–15.0)
MCH: 22 pg — ABNORMAL LOW (ref 26.0–34.0)
MCHC: 29.3 g/dL — ABNORMAL LOW (ref 30.0–36.0)
MCV: 74.9 fL — ABNORMAL LOW (ref 80.0–100.0)
Platelets: 225 10*3/uL (ref 150–400)
RBC: 4.51 MIL/uL (ref 3.87–5.11)
RDW: 18 % — ABNORMAL HIGH (ref 11.5–15.5)
WBC: 4.4 10*3/uL (ref 4.0–10.5)
nRBC: 0 % (ref 0.0–0.2)

## 2022-02-23 LAB — TSH: TSH: 1.544 u[IU]/mL (ref 0.350–4.500)

## 2022-02-23 LAB — COMPREHENSIVE METABOLIC PANEL
ALT: 13 U/L (ref 0–44)
AST: 16 U/L (ref 15–41)
Albumin: 3.5 g/dL (ref 3.5–5.0)
Alkaline Phosphatase: 95 U/L (ref 38–126)
Anion gap: 7 (ref 5–15)
BUN: 13 mg/dL (ref 6–20)
CO2: 26 mmol/L (ref 22–32)
Calcium: 9.1 mg/dL (ref 8.9–10.3)
Chloride: 104 mmol/L (ref 98–111)
Creatinine, Ser: 0.8 mg/dL (ref 0.44–1.00)
GFR, Estimated: >60 mL/min (ref >60)
Glucose, Bld: 92 mg/dL (ref 70–99)
Potassium: 3.6 mmol/L (ref 3.5–5.1)
Sodium: 137 mmol/L (ref 135–145)
Total Bilirubin: 0.3 mg/dL (ref 0.3–1.2)
Total Protein: 6.9 g/dL (ref 6.5–8.1)

## 2022-02-23 NOTE — Telephone Encounter (Signed)
Patient called she missed her appointment that was scheduled for this morning, she went to the ED last night for palpitations,patient stated she is having a hard time sleeping due to having panic attacks and having withdrawals from stopping  paroxetine. patient stated she feels like she is "going crazy" and wants to know if she needs to go to behavior health.

## 2022-02-23 NOTE — Telephone Encounter (Signed)
Advised patient to check herself in.

## 2022-02-23 NOTE — ED Notes (Signed)
Pt called multiple times no answer and pt called outside no answer

## 2022-02-23 NOTE — Assessment & Plan Note (Deleted)
On prior office visit, patient was started on new medication for her GAD. Duloxetine 30mg  daily and Buspar 5mg  daily. She also had hydroxyzine 10mg  Q8H PRN. We discontinued her Paroxetine, as she felt this medication was worsening her symptoms. She was made aware that initiating these new medications can result in temporarily worsening of anxiety/anxiousness before things improve. She was told to call the office or go to the ED is she feels her symptoms warrant medical intervention. She was seen in the ED early this morning 1am-3am for palpitations, however left shortly after being seen by triage. Blood work was obtained, TSH was within normal limits, as we were suspecting superimposed thyroid disease causing her persistent acute episodes.

## 2022-02-23 NOTE — Assessment & Plan Note (Deleted)
Patient was seen in the ED breifly for palpitations. Blood work was obtained and CBC was significant for microcytic anemia with a hgb of 9. She has a prior hx of iron deficiency anemia 2/2 abnormal uterine bleeding.   Fatigue? Weakness? Heavy bleeding currenty?   P: -Obtain iron studies

## 2022-02-23 NOTE — ED Triage Notes (Signed)
Pt c/o palpitations that wake her from sleep. Denies chest pain/shortness of breath.

## 2022-02-25 ENCOUNTER — Encounter: Payer: Self-pay | Admitting: Internal Medicine

## 2022-02-25 ENCOUNTER — Ambulatory Visit (INDEPENDENT_AMBULATORY_CARE_PROVIDER_SITE_OTHER): Payer: Medicare Other | Admitting: Internal Medicine

## 2022-02-25 ENCOUNTER — Ambulatory Visit (INDEPENDENT_AMBULATORY_CARE_PROVIDER_SITE_OTHER): Payer: Medicare Other

## 2022-02-25 ENCOUNTER — Other Ambulatory Visit: Payer: Self-pay

## 2022-02-25 VITALS — Ht 64.0 in | Wt 241.7 lb

## 2022-02-25 VITALS — BP 131/74 | HR 113 | Temp 98.2°F | Ht 64.0 in | Wt 241.7 lb

## 2022-02-25 DIAGNOSIS — Z0000 Encounter for general adult medical examination without abnormal findings: Secondary | ICD-10-CM

## 2022-02-25 DIAGNOSIS — F411 Generalized anxiety disorder: Secondary | ICD-10-CM | POA: Diagnosis not present

## 2022-02-25 DIAGNOSIS — D500 Iron deficiency anemia secondary to blood loss (chronic): Secondary | ICD-10-CM | POA: Diagnosis present

## 2022-02-25 DIAGNOSIS — R002 Palpitations: Secondary | ICD-10-CM

## 2022-02-25 MED ORDER — BUSPIRONE HCL 5 MG PO TABS: 5.0000 mg | ORAL_TABLET | Freq: Two times a day (BID) | ORAL | 0 refills | Status: DC

## 2022-02-25 NOTE — Progress Notes (Signed)
Subjective:   Paula Leon is a 56 y.o. female who presents for an Initial Medicare Annual Wellness Visit. I connected with  Paula Leon on 02/25/22 by a IN PERSON VISIT TO [facility]  [facility]   Provider Location: Office/Clinic  I discussed the limitations of evaluation and management by telemedicine. The patient expressed understanding and agreed to proceed.  Review of Systems    DEFERRED TO PCP        Objective:    There were no vitals filed for this visit. There is no height or weight on file to calculate BMI.     02/25/2022   10:49 AM 02/21/2022    5:30 AM 02/01/2022    8:37 PM 11/26/2021    9:58 AM 10/28/2021   10:02 AM 08/04/2021   10:24 AM 07/15/2021    9:55 AM  Advanced Directives  Does Patient Have a Medical Advance Directive? No No No No No No No  Would patient like information on creating a medical advance directive? No - Patient declined  No - Patient declined No - Patient declined No - Patient declined No - Patient declined No - Patient declined    Current Medications (verified) Outpatient Encounter Medications as of 02/25/2022  Medication Sig   busPIRone (BUSPAR) 5 MG tablet Take 1 tablet (5 mg total) by mouth 2 (two) times daily.   diclofenac Sodium (VOLTAREN) 1 % GEL Apply 4 g topically 4 (four) times daily. (Patient taking differently: Apply 4 g topically 4 (four) times daily as needed (joint pain).)   DULoxetine (CYMBALTA) 30 MG capsule Take 1 capsule (30 mg total) by mouth daily. (Patient not taking: Reported on 02/21/2022)   ferrous sulfate 325 (65 FE) MG tablet Take 1 tablet (325 mg total) by mouth daily with breakfast. (Patient not taking: Reported on 03/26/2021)   hydrOXYzine (ATARAX/VISTARIL) 10 MG tablet Take 2.5 tablets (25 mg total) by mouth every 8 (eight) hours as needed. (Patient taking differently: Take 10 mg by mouth every 8 (eight) hours as needed for anxiety.)   methadone (DOLOPHINE) 10 MG/5ML solution Take 35 mg by mouth every  morning.   [DISCONTINUED] famotidine (PEPCID) 20 MG tablet Take 1 tablet (20 mg total) by mouth 2 (two) times daily. (Patient not taking: Reported on 09/03/2019)   No facility-administered encounter medications on file as of 02/25/2022.    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   Transfusion history    due to anemia with hb 4.0   Past Surgical History:  Procedure Laterality Date   INSERTION OF MESH N/A 04/19/2019   Procedure: INSERTION OF MESH;  Surgeon: Darnell LevelGerkin, Todd, MD;  Location: [facility];  Service: General;  Laterality: N/A;   SALPINGECTOMY Left    due to ruptured ectopic   VENTRAL HERNIA REPAIR N/A 04/19/2019   Procedure: VENTRAL HERNIA REPAIR;  Surgeon: Darnell LevelGerkin, Todd, MD;  Location: [facility];  Service: General;  Laterality: N/A;   Family History  Problem Relation Age of Onset   Cancer Mother    Cancer Father    Colon cancer Neg Hx    Colon polyps Neg Hx    Esophageal cancer Neg Hx  Rectal cancer Neg Hx    Stomach cancer Neg Hx    Social History   Socioeconomic History   Marital status: Single    Spouse name: Not on file   Number of children: Not on file   Years of education: Not on file   Highest education level: Not on file  Occupational History   Not on file  Tobacco Use   Smoking status: Every Day    Packs/day: 0.25    Types: Cigarettes   Smokeless tobacco: Never   Tobacco comments:    4-5  cigarettes per day.   Wants to stop  Vaping Use   Vaping Use: Never used  Substance and Sexual Activity   Alcohol use: Not Currently    Comment: 2012   Drug use: Not Currently    Types: Heroin, Marijuana    Comment: last time used drugs was 2012   Sexual activity: Not Currently    Birth control/protection: Surgical  Other  Topics Concern   Not on file  Social History Narrative   Not on file   Social Determinants of Health   Financial Resource Strain: Not on file  Food Insecurity: Not on file  Transportation Needs: Not on file  Physical Activity: Not on file  Stress: Not on file  Social Connections: Not on file    Tobacco Counseling Ready to quit: Not Answered Counseling given: Not Answered Tobacco comments: 4-5  cigarettes per day.   Wants to stop   Clinical Intake:                 Diabetic? NO          Activities of Daily Living    02/25/2022   10:47 AM 02/02/2022    9:45 AM  In your present state of health, do you have any difficulty performing the following activities:  Hearing? 0 0  Vision? 1 0  Difficulty concentrating or making decisions? 1 0  Walking or climbing stairs? 0 0  Dressing or bathing? 0 0  Doing errands, shopping? 0 0    Patient Care Team: Dellis Filbert, MD as PCP - General  Indicate any recent Medical Services you may have received from other than [facility] providers in the past year (date may be approximate).     Assessment:   This is a routine wellness examination for Paula Leon.  Hearing/Vision screen No results found.  Dietary issues and exercise activities discussed:     Goals Addressed   None   Depression Screen    02/25/2022   10:46 AM 02/02/2022    9:45 AM 11/26/2021    9:59 AM 10/28/2021   11:47 AM 07/15/2021    4:47 PM 06/11/2021   11:47 AM 04/09/2021   10:13 AM  PHQ 2/9 Scores  PHQ - 2 Score 0 0 5 6 0 5 0  PHQ- 9 Score  0 6 6 3 12 7     Fall Risk    02/25/2022   10:46 AM 02/02/2022    9:45 AM 11/26/2021    9:58 AM 10/28/2021   10:00 AM 08/04/2021   10:23 AM  Fall Risk   Falls in the past year? 0 0 0 0 0  Number falls in past yr: 0 0  0 0  Injury with Fall? 0 0  0 0  Risk for fall due to : No Fall Risks No Fall Risks  No Fall Risks No Fall Risks  Follow up Falls evaluation completed;Falls prevention discussed Falls evaluation  completed;Falls  prevention discussed Falls evaluation completed Falls evaluation completed;Falls prevention discussed Falls evaluation completed;Falls prevention discussed    FALL RISK PREVENTION PERTAINING TO THE HOME:  Any stairs in or around the home? Yes  If so, are there any without handrails? No  Home free of loose throw rugs in walkways, pet beds, electrical cords, etc? Yes  Adequate lighting in your home to reduce risk of falls? Yes   ASSISTIVE DEVICES UTILIZED TO PREVENT FALLS:  Life alert? No  Use of a cane, walker or w/c? No  Grab bars in the bathroom? No  Shower chair or bench in shower? No  Elevated toilet seat or a handicapped toilet? No   TIMED UP AND GO:  Was the test performed? No .  Length of time to ambulate 10 feet: N/A sec.     Cognitive Function:        Immunizations Immunization History  Administered Date(s) Administered   Moderna Sars-Covid-2 Vaccination 12/26/2019, 01/22/2020   Tdap 09/03/2019    TDAP status: Up to date  Flu Vaccine status: Up to date  Pneumococcal vaccine status: Declined,  Education has been provided regarding the importance of this vaccine but patient still declined. Advised may receive this vaccine at local pharmacy or Health Dept. Aware to provide a copy of the vaccination record if obtained from local pharmacy or Health Dept. Verbalized acceptance and understanding.   Covid-19 vaccine status: Completed vaccines  Qualifies for Shingles Vaccine? No   Zostavax completed No   Shingrix Completed?: No.    Education has been provided regarding the importance of this vaccine. Patient has been advised to call insurance company to determine out of pocket expense if they have not yet received this vaccine. Advised may also receive vaccine at local pharmacy or Health Dept. Verbalized acceptance and understanding.  Screening Tests Health Maintenance  Topic Date Due   Zoster Vaccines- Shingrix (1 of 2) Never done   COVID-19  Vaccine (3 - Booster for Moderna series) 03/18/2020   INFLUENZA VACCINE  04/26/2022   MAMMOGRAM  07/03/2022   PAP SMEAR-Modifier  08/29/2023   TETANUS/TDAP  09/02/2029   COLONOSCOPY (Pts 45-54yrs Insurance coverage will need to be confirmed)  10/19/2030   Hepatitis C Screening  Completed   HIV Screening  Completed   HPV VACCINES  Aged Out    Health Maintenance  Health Maintenance Due  Topic Date Due   Zoster Vaccines- Shingrix (1 of 2) Never done   COVID-19 Vaccine (3 - Booster for Moderna series) 03/18/2020    Colorectal cancer screening: Type of screening: Colonoscopy. Completed 10/19/2020. Repeat every 10 years  Mammogram status: Completed 2. Repeat every year  Bone Density status: Ordered DEFERRED TO PCP PT IS NOT TO AGE AS OF YET . Pt provided with contact info and advised to call to schedule appt.  Lung Cancer Screening: (Low Dose CT Chest recommended if Age 61-80 years, 30 pack-year currently smoking OR have quit w/in 15years.) does not qualify.   Lung Cancer Screening Referral: DEFERRED TO PCP   Additional Screening:  Hepatitis C Screening: does qualify; Completed 08/28/2020  Vision Screening: Recommended annual ophthalmology exams for early detection of glaucoma and other disorders of the eye. Is the patient up to date with their annual eye exam?  No  Who is the provider or what is the name of the office in which the patient attends annual eye exams?  DOES NOT HAVE  If pt is not established with a provider, would they like to be referred  to a provider to establish care? Yes .   Dental Screening: Recommended annual dental exams for proper oral hygiene  Community Resource Referral / Chronic Care Management: CRR required this visit?  Yes   CCM required this visit?  Yes      Plan:     I have personally reviewed and noted the following in the patient's chart:   Medical and social history Use of alcohol, tobacco or illicit drugs  Current medications and  supplements including opioid prescriptions. Patient is not currently taking opioid prescriptions. Functional ability and status Nutritional status Physical activity Advanced directives List of other physicians Hospitalizations, surgeries, and ER visits in previous 12 months Vitals Screenings to include cognitive, depression, and falls Referrals and appointments  In addition, I have reviewed and discussed with patient certain preventive protocols, quality metrics, and best practice recommendations. A written personalized care plan for preventive services as well as general preventive health recommendations were provided to patient.     Derrell Lolling, CMA   02/25/2022   Nurse Notes:  IN PERSON VISIT    Paula Leon , Thank you for taking time to come for your Medicare Wellness Visit. I appreciate your ongoing commitment to your health goals. Please review the following plan we discussed and let me know if I can assist you in the future.   These are the goals we discussed:  Goals   None     This is a list of the screening recommended for you and due dates:  Health Maintenance  Topic Date Due   Zoster (Shingles) Vaccine (1 of 2) Never done   COVID-19 Vaccine (3 - Booster for Moderna series) 03/18/2020   Flu Shot  04/26/2022   Mammogram  07/03/2022   Pap Smear  08/29/2023   Tetanus Vaccine  09/02/2029   Colon Cancer Screening  10/19/2030   Hepatitis C Screening: USPSTF Recommendation to screen - Ages 18-79 yo.  Completed   HIV Screening  Completed   HPV Vaccine  Aged Out

## 2022-02-25 NOTE — Progress Notes (Signed)
   CC: ED follow-up  HPI:  [name] is a 56 y.o. with past medical history as noted below who presents to the clinic today for an ED follow-up. Please see problem-based list for further details, assessments, and plans.   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   Physical Exam:  Vitals:   02/25/22 1039  BP: 131/74  Pulse: (!) 113  Temp: 98.2 F (36.8 C)  TempSrc: Oral  SpO2: 98%  Weight: 241 lb 11.2 oz (109.6 kg)  Height: 5\' 4"  (1.626 m)   General: NAD, nl appearance HE: Normocephalic, atraumatic, EOMI, Conjunctivae normal ENT: No congestion, no rhinorrhea, no exudate or erythema  Cardiovascular: Tachycardic, regular rhythm. No murmurs, rubs, or gallops Pulmonary: Effort normal, breath sounds normal. No wheezes, rales, or rhonchi Abdominal: soft, nontender, bowel sounds present Musculoskeletal: no swelling, deformity, injury or tenderness in extremities Skin: Warm, dry, no bruising, erythema, or rash Psychiatric/Behavioral: anxious  Assessment & Plan:   See Encounters Tab for problem based charting.  Patient discussed with Dr.  

## 2022-02-25 NOTE — Patient Instructions (Signed)
Thank you, [name] for allowing [place] to provide your care today. Today we discussed your palpitations and anxiety.  I have reordered your buspar. Take this only for now while you alk to your methadone clinic about how to wean off of methadone.  I am getting blood today. I will contact you on Monday or Tuesday with the results. This could be contributing to your heart racing. I do not believe there are any problems with your heart.    I have ordered the following labs for you:  Lab Orders         Iron, TIBC and Ferritin Panel       Referrals ordered today:   Referral Orders  No referral(s) requested today     I have ordered the following medication/changed the following medications:     Start the following medications: Meds ordered this encounter  Medications   busPIRone (BUSPAR) 5 MG tablet    Sig: Take 1 tablet (5 mg total) by mouth 2 (two) times daily.    Dispense:  180 tablet    Refill:  0     Follow up:  1 month   But I will call you next week!  Should you have any questions or concerns please call the internal medicine clinic at [phone].

## 2022-02-25 NOTE — Assessment & Plan Note (Signed)
The patient presents here for an ED follow-up.  She was seen for palpitations which started last July.  States that she has been told that these palpitations are associated with anxiety, but she is worried about her heart.  In the ED, EKG, troponins, electrolytes were all within normal limits.  She did have a hemoglobin of 9.9, down from her baseline of around 10-11.   Today, the patient is worried that her palpitations are still related to an abnormality with her heart.  She does, however, states that she feels very anxious during the day and is not on any medications for this currently.  She states that she feels like her heart is fluttering causing her to wake up at night while she "catches her breath."  Denies any chest pain or shortness of breath.  Exam: She is tachycardic, however pulses are intact and no murmurs rubs or gallops are appreciated.  Plan: Discussed that there is likely an anxiety component to her palpitations, however there could also be an iron deficiency component as well, therefore we will obtain iron studies today and contact patient with these results.  Patient is hesitant to starting an SSRI at this time due to it making her feel "more flat," especially while she is on methadone which "makes her feel flat."  Discussed that buspirone and duloxetine could be an effective combination, however will start buspirone alone and follow-up with the patient in 4 weeks to assess her response.  She also states she is interested in weaning off her methadone, stating that she thinks this is related to her palpitations and that she wants to get this under control before solely focusing on her anxiety.  I discussed that she will need to go to her methadone clinic if she is interested in weaning off her methadone.  -iron studies pending -Start BuSpar 5 mg twice daily -Patient will discuss with methadone clinic about weaning off methadone -Follow-up in 4 weeks

## 2022-02-25 NOTE — Assessment & Plan Note (Addendum)
See "palpitations" for more information. States hydroxyzine does not work for her anymore.  Prescribed BuSpar but has not tried this medication yet.  Patient does not want to start an SSRI at this time but is amenable to trying BuSpar.  -Started BuSpar 5 mg twice daily today

## 2022-02-26 LAB — IRON,TIBC AND FERRITIN PANEL
Ferritin: 10 ng/mL — ABNORMAL LOW (ref 15–150)
Iron Saturation: 6 % — CL (ref 15–55)
Iron: 22 ug/dL — ABNORMAL LOW (ref 27–159)
Total Iron Binding Capacity: 387 ug/dL (ref 250–450)
UIBC: 365 ug/dL (ref 131–425)

## 2022-02-27 ENCOUNTER — Other Ambulatory Visit: Payer: Self-pay

## 2022-02-27 ENCOUNTER — Emergency Department (HOSPITAL_COMMUNITY)
Admission: EM | Admit: 2022-02-27 | Discharge: 2022-02-27 | Discharge status: Against Medical Advice | Payer: Medicare Other

## 2022-02-27 ENCOUNTER — Emergency Department (HOSPITAL_COMMUNITY)
Admission: EM | Admit: 2022-02-27 | Discharge: 2022-02-27 | Discharge status: Against Medical Advice | Payer: Medicare Other | Attending: Emergency Medicine | Admitting: Emergency Medicine

## 2022-02-27 ENCOUNTER — Ambulatory Visit (INDEPENDENT_AMBULATORY_CARE_PROVIDER_SITE_OTHER)
Admission: EM | Admit: 2022-02-27 | Discharge: 2022-02-27 | Discharge status: Against Medical Advice | Payer: Medicare Other | Source: Home / Self Care

## 2022-02-27 ENCOUNTER — Encounter (HOSPITAL_COMMUNITY): Payer: Self-pay

## 2022-02-27 DIAGNOSIS — Z79899 Other long term (current) drug therapy: Secondary | ICD-10-CM | POA: Insufficient documentation

## 2022-02-27 DIAGNOSIS — F411 Generalized anxiety disorder: Secondary | ICD-10-CM | POA: Insufficient documentation

## 2022-02-27 DIAGNOSIS — Z20822 Contact with and (suspected) exposure to covid-19: Secondary | ICD-10-CM | POA: Insufficient documentation

## 2022-02-27 DIAGNOSIS — F32A Depression, unspecified: Secondary | ICD-10-CM | POA: Insufficient documentation

## 2022-02-27 DIAGNOSIS — D649 Anemia, unspecified: Secondary | ICD-10-CM | POA: Insufficient documentation

## 2022-02-27 DIAGNOSIS — R002 Palpitations: Secondary | ICD-10-CM | POA: Insufficient documentation

## 2022-02-27 DIAGNOSIS — R45851 Suicidal ideations: Secondary | ICD-10-CM | POA: Insufficient documentation

## 2022-02-27 DIAGNOSIS — F419 Anxiety disorder, unspecified: Secondary | ICD-10-CM | POA: Insufficient documentation

## 2022-02-27 DIAGNOSIS — Z5321 Procedure and treatment not carried out due to patient leaving prior to being seen by health care provider: Secondary | ICD-10-CM | POA: Diagnosis not present

## 2022-02-27 DIAGNOSIS — R000 Tachycardia, unspecified: Secondary | ICD-10-CM | POA: Diagnosis not present

## 2022-02-27 LAB — POCT URINE DRUG SCREEN - MANUAL ENTRY (I-SCREEN)
POC Amphetamine UR: NOT DETECTED
POC Buprenorphine (BUP): NOT DETECTED
POC Cocaine UR: NOT DETECTED
POC Marijuana UR: NOT DETECTED
POC Methadone UR: POSITIVE — AB
POC Methamphetamine UR: NOT DETECTED
POC Morphine: NOT DETECTED
POC Oxazepam (BZO): NOT DETECTED
POC Oxycodone UR: NOT DETECTED
POC Secobarbital (BAR): NOT DETECTED

## 2022-02-27 LAB — POC SARS CORONAVIRUS 2 AG -  ED: SARS Coronavirus 2 Ag: NEGATIVE

## 2022-02-27 LAB — COMPREHENSIVE METABOLIC PANEL
ALT: 10 U/L (ref 0–44)
AST: 15 U/L (ref 15–41)
Albumin: 3.6 g/dL (ref 3.5–5.0)
Alkaline Phosphatase: 97 U/L (ref 38–126)
Anion gap: 6 (ref 5–15)
BUN: 12 mg/dL (ref 6–20)
CO2: 28 mmol/L (ref 22–32)
Calcium: 9 mg/dL (ref 8.9–10.3)
Chloride: 102 mmol/L (ref 98–111)
Creatinine, Ser: 0.78 mg/dL (ref 0.44–1.00)
GFR, Estimated: >60 mL/min (ref >60)
Glucose, Bld: 84 mg/dL (ref 70–99)
Potassium: 4.1 mmol/L (ref 3.5–5.1)
Sodium: 136 mmol/L (ref 135–145)
Total Bilirubin: 0.5 mg/dL (ref 0.3–1.2)
Total Protein: 6.9 g/dL (ref 6.5–8.1)

## 2022-02-27 LAB — LIPID PANEL
Cholesterol: 137 mg/dL (ref 0–200)
HDL: 46 mg/dL (ref >40)
LDL Cholesterol: 76 mg/dL (ref 0–99)
Total CHOL/HDL Ratio: 3 RATIO
Triglycerides: 77 mg/dL (ref <150)
VLDL: 15 mg/dL (ref 0–40)

## 2022-02-27 LAB — CBC WITH DIFFERENTIAL/PLATELET
Abs Immature Granulocytes: 0.01 10*3/uL (ref 0.00–0.07)
Basophils Absolute: 0 10*3/uL (ref 0.0–0.1)
Basophils Relative: 1 %
Eosinophils Absolute: 0.1 10*3/uL (ref 0.0–0.5)
Eosinophils Relative: 3 %
HCT: 33.5 % — ABNORMAL LOW (ref 36.0–46.0)
Hemoglobin: 10.3 g/dL — ABNORMAL LOW (ref 12.0–15.0)
Immature Granulocytes: 0 %
Lymphocytes Relative: 28 %
Lymphs Abs: 1.2 10*3/uL (ref 0.7–4.0)
MCH: 22.4 pg — ABNORMAL LOW (ref 26.0–34.0)
MCHC: 30.7 g/dL (ref 30.0–36.0)
MCV: 72.8 fL — ABNORMAL LOW (ref 80.0–100.0)
Monocytes Absolute: 0.4 10*3/uL (ref 0.1–1.0)
Monocytes Relative: 10 %
Neutro Abs: 2.5 10*3/uL (ref 1.7–7.7)
Neutrophils Relative %: 58 %
Platelets: 206 10*3/uL (ref 150–400)
RBC: 4.6 MIL/uL (ref 3.87–5.11)
RDW: 18 % — ABNORMAL HIGH (ref 11.5–15.5)
WBC: 4.3 10*3/uL (ref 4.0–10.5)
nRBC: 0 % (ref 0.0–0.2)

## 2022-02-27 LAB — HEMOGLOBIN A1C
Hgb A1c MFr Bld: 5.4 % (ref 4.8–5.6)
Mean Plasma Glucose: 108.28 mg/dL

## 2022-02-27 LAB — ETHANOL: Alcohol, Ethyl (B): <10 mg/dL (ref <10)

## 2022-02-27 LAB — TSH: TSH: 1.226 u[IU]/mL (ref 0.350–4.500)

## 2022-02-27 LAB — SARS CORONAVIRUS 2 BY RT PCR: SARS Coronavirus 2 by RT PCR: NEGATIVE

## 2022-02-27 MED ORDER — MAGNESIUM HYDROXIDE 400 MG/5ML PO SUSP: 30.0000 mL | Freq: Every day | ORAL | Status: DC | PRN

## 2022-02-27 MED ORDER — METHADONE HCL 10 MG/5ML PO SOLN
35.0000 mg | Freq: Every morning | ORAL | Status: DC
  Filled 2022-02-27: qty 17.5

## 2022-02-27 MED ORDER — LORAZEPAM 1 MG PO TABS
0.5000 mg | ORAL_TABLET | Freq: Once | ORAL | Status: DC
  Filled 2022-02-27: qty 1

## 2022-02-27 MED ORDER — TRAZODONE HCL 50 MG PO TABS
50.0000 mg | ORAL_TABLET | Freq: Every day | ORAL | Status: DC
  Administered 2022-02-27: 50 mg via ORAL
  Filled 2022-02-27: qty 1

## 2022-02-27 MED ORDER — BUSPIRONE HCL 5 MG PO TABS: 5.0000 mg | ORAL_TABLET | Freq: Two times a day (BID) | ORAL | Status: DC

## 2022-02-27 MED ORDER — ALUM & MAG HYDROXIDE-SIMETH 200-200-20 MG/5ML PO SUSP: 30.0000 mL | ORAL | Status: DC | PRN

## 2022-02-27 MED ORDER — ACETAMINOPHEN 325 MG PO TABS: 650.0000 mg | ORAL_TABLET | Freq: Four times a day (QID) | ORAL | Status: DC | PRN

## 2022-02-27 MED ORDER — HYDROXYZINE HCL 25 MG PO TABS
25.0000 mg | ORAL_TABLET | Freq: Three times a day (TID) | ORAL | Status: DC | PRN
  Filled 2022-02-27: qty 1

## 2022-02-27 NOTE — BH Assessment (Signed)
Comprehensive Clinical Assessment (CCA) Note  02/27/2022 [name] [id number]  DISPOSITION: Karel Jarvis, PA completed MSE and recommended Pt be admitted for continuous assessment.  The patient demonstrates the following risk factors for suicide: Chronic risk factors for suicide include: psychiatric disorder of GAD . Acute risk factors for suicide include: loss (financial, interpersonal, professional). Protective factors for this patient include: positive social support, responsibility to others (children, family), coping skills, and hope for the future. Considering these factors, the overall suicide risk at this point appears to be low. Patient is appropriate for outpatient follow up.  Flowsheet Row ED from 02/27/2022 in [facility] ED from 02/23/2022 in [facility] EMERGENCY DEPARTMENT ED from 02/21/2022 in [facility] EMERGENCY DEPARTMENT  C-SSRS RISK CATEGORY Low Risk No Risk No Risk      Pt is a 56 year old single female who presents unaccompanied to [facility] reporting severe anxiety and daily panic attacks. Pt states panic attacks have increased in frequency and intensity and reports "what's the point of living if I have to live like this." Pt endorses passive suicidal ideation with no plan or intent. She denies history of suicide attempts. She denies history of NSSIB. She describes her mood as anxious and Pt acknowledges symptoms including crying spells, loss of interest in usual pleasures, fatigue, irritability, decreased concentration, decreased sleep, decreased appetite and feelings of hopelessness. She denies current homicidal ideation or history of aggression. She denies auditory or visual hallucinations. Pt reports a history of using heroin but has been on methadone since 2012 and maintained sobriety. She denies other substance use.  Pt states that she recently had a medication change and does not believe that the medication is  assisting with her anxiety. Pt reports that her son is currently inpatient with mental health and shares that she is living at home alone. Pt reports that she has support from her 5 children but "they have their own lives." She lost her 3rd son a few years ago. She also identifies people from meetings as supportive. Pt also reports that  Pt is not currently in therapy but shares that it was helpful in the past. She says she does not have a psychiatrist. She denies history of abuse. She denies legal problems. She denies access to firearms.  Pt is casually dressed, alert and oriented x4. Pt speaks in a soft tone, at moderate volume and normal pace. Motor behavior appears restless and Pt is bouncing her feet. Eye contact is good. Her mood is anxious and affect is congruent with mood. Thought process is coherent and relevant. There is no indication Pt is currently responding to internal stimuli or experiencing delusional thought content. She is cooperative and asking for help.   Chief Complaint:  Chief Complaint  Patient presents with   Panic Attack   Visit Diagnosis: F41.1 Generalized anxiety disorder   CCA Screening, Triage and Referral (STR)  Patient Reported Information How did you hear about [place]? Self  What Is the Reason for Your Visit/Call Today? Pt presents to [facility] unaccompanied and voluntarily. Pt states that she has increased panic attack and reports "what's the point of living if I have to live like this." Pt endorses SI ideation without a plan. Pt states that she recently had a medication change and does not believe that the medication is assisting with her anxiety. 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 Pt denies HI and AVH. Pt states that she is not depressed but  feels like she will become depressed if she has to live life like this. Pt states that she has increased tension in her shoulders and can not sleep. Pt states that she does not want to be alone. Pt reports that her son  is currently inpatient with mental health and shares that she is living at home alone. Pt reports that she has support.Pt also reports that she lost her 3rd son a few years ago. Pt is not currently in therapy but shares that it was helpful in the past.  How Long Has This Been Causing You Problems? 1 wk - 1 month  What Do You Feel Would Help You the Most Today? Treatment for Depression or other mood problem   Have You Recently Had Any Thoughts About Hurting Yourself? Yes  Are You Planning to Commit Suicide/Harm Yourself At This time? No   Have you Recently Had Thoughts About Hurting Someone Karolee Ohs? No  Are You Planning to Harm Someone at This Time? No  Explanation: No data recorded  Have You Used Any Alcohol or Drugs in the Past 24 Hours? No  How Long Ago Did You Use Drugs or Alcohol? No data recorded What Did You Use and How Much? No data recorded  Do You Currently Have a Therapist/Psychiatrist? No  Name of Therapist/Psychiatrist: No data recorded  Have You Been Recently Discharged From Any Office Practice or Programs? No  Explanation of Discharge From Practice/Program: No data recorded    CCA Screening Triage Referral Assessment Type of Contact: Face-to-Face  Telemedicine Service Delivery:   Is this Initial or Reassessment? No data recorded Date Telepsych consult ordered in [facility]:  No data recorded Time Telepsych consult ordered in [facility]:  No data recorded Location of Assessment: [facility] Assessment Services  Provider Location: [facility] Assessment Services   Collateral Involvement: None   Does Patient Have a Automotive engineer Guardian? No data recorded Name and Contact of Legal Guardian: No data recorded If Minor and Not Living with Parent(s), Who has Custody? NA  Is CPS involved or ever been involved? Never  Is APS involved or ever been involved? Never   Patient Determined To Be At Risk for Harm To Self or Others Based on Review of Patient Reported Information or  Presenting Complaint? No  Method: No data recorded Availability of Means: No data recorded Intent: No data recorded Notification Required: No data recorded Additional Information for Danger to Others Potential: No data recorded Additional Comments for Danger to Others Potential: No data recorded Are There Guns or Other Weapons in Your Home? No data recorded Types of Guns/Weapons: No data recorded Are These Weapons Safely Secured?                            No data recorded Who Could Verify You Are Able To Have These Secured: No data recorded Do You Have any Outstanding Charges, Pending Court Dates, Parole/Probation? No data recorded Contacted To Inform of Risk of Harm To Self or Others: No data recorded   Does Patient Present under Involuntary Commitment? No  IVC Papers Initial File Date: No data recorded  [place] of Residence: [place]   Patient Currently Receiving the Following Services: Not Receiving Services   Determination of Need: Emergent (2 hours)   Options For Referral: Facility-Based Crisis; Outpatient Therapy; Medication Management; Intensive Outpatient Therapy     CCA Biopsychosocial Patient Reported Schizophrenia/Schizoaffective Diagnosis in Past: No   Strengths: Pt has good  family support   Mental Health Symptoms Depression:   Change in energy/activity; Difficulty Concentrating; Fatigue; Hopelessness; Increase/decrease in appetite; Sleep (too much or little); Tearfulness; Weight gain/loss   Duration of Depressive symptoms:  Duration of Depressive Symptoms: Greater than two weeks   Mania:   None   Anxiety:    Difficulty concentrating; Fatigue; Irritability; Restlessness; Sleep; Tension; Worrying   Psychosis:   None   Duration of Psychotic symptoms:    Trauma:   None   Obsessions:   None   Compulsions:   None   Inattention:   N/A   Hyperactivity/Impulsivity:   N/A   Oppositional/Defiant Behaviors:   N/A   Emotional Irregularity:    None   Other Mood/Personality Symptoms:   NA    Mental Status Exam Appearance and self-care  Stature:   Average   Weight:   Overweight   Clothing:   Casual   Grooming:   Normal   Cosmetic use:   None   Posture/gait:   Tense   Motor activity:   Restless   Sensorium  Attention:   Normal   Concentration:   Anxiety interferes   Orientation:   X5   Recall/memory:   Normal   Affect and Mood  Affect:   Anxious   Mood:   Anxious   Relating  Eye contact:   Normal   Facial expression:   Anxious; Tense   Attitude toward examiner:   Cooperative   Thought and Language  Speech flow:  Normal   Thought content:   Appropriate to Mood and Circumstances   Preoccupation:   None   Hallucinations:   None   Organization:  No data recorded  [employer] of Knowledge:   Average   Intelligence:   Average   Abstraction:   Normal   Judgement:   Fair   Reality Testing:   Adequate   Insight:   Fair   Decision Making:   Normal   Social Functioning  Social Maturity:   Responsible   Social Judgement:   Normal   Stress  Stressors:   Grief/losses   Coping Ability:   Exhausted; Overwhelmed   Skill Deficits:   None   Supports:   Family; Friends/Service system     Religion: Religion/Spirituality Are You A Religious Person?: Yes What is Your Religious Affiliation?: None How Might This Affect Treatment?: Pt describes herself as spiritual  Leisure/Recreation: Leisure / Recreation Do You Have Hobbies?: Yes Leisure and Hobbies: Dining out, listening to music, reading  Exercise/Diet: Exercise/Diet Do You Exercise?: Yes What Type of Exercise Do You Do?: Run/Walk How Many Times a Week Do You Exercise?: 4-5 times a week Have You Gained or Lost A Significant Amount of Weight in the Past Six Months?: Yes-Lost Number of Pounds Lost?: 20 Do You Follow a Special Diet?: Yes Type of Diet: Pt reports she tried to avoid  fried foods Do You Have Any Trouble Sleeping?: Yes Explanation of Sleeping Difficulties: Pt reports poor sleep   CCA Employment/Education Employment/Work Situation: Employment / Work Situation Employment Situation: On disability Patient's Job has Been Impacted by Current Illness: No Has Patient ever Been in the [employer]?: No  Education: Education Is Patient Currently Attending School?: No Last Grade Completed: 11 Did You Product manager?: No Did You Have An Individualized Education Program (IIEP): No Did You Have Any Difficulty At School?: No Patient's Education Has Been Impacted by Current Illness: No   CCA Family/Childhood History Family and Relationship History: Family history  Marital status: Single Does patient have children?: Yes How many children?: 5 How is patient's relationship with their children?: Good relationship with children  Childhood History:  Childhood History Did patient suffer any verbal/emotional/physical/sexual abuse as a child?: No Did patient suffer from severe childhood neglect?: No Has patient ever been sexually abused/assaulted/raped as an adolescent or adult?: No Was the patient ever a victim of a crime or a disaster?: No Witnessed domestic violence?: No Has patient been affected by domestic violence as an adult?: No  Child/Adolescent Assessment:     CCA Substance Use Alcohol/Drug Use: Alcohol / Drug Use Pain Medications: Denies abuse Prescriptions: Denies abuse Over the Counter: Denies abuse History of alcohol / drug use?: Yes (Pt used to use heroin. Stopped in 2012.) Longest period of sobriety (when/how long): 11 years currently                         ASAM's:  Six Dimensions of Multidimensional Assessment  Dimension 1:  Acute Intoxication and/or Withdrawal Potential:      Dimension 2:  Biomedical Conditions and Complications:      Dimension 3:  Emotional, Behavioral, or Cognitive Conditions and Complications:      Dimension 4:  Readiness to Change:     Dimension 5:  Relapse, Continued use, or Continued Problem Potential:     Dimension 6:  Recovery/Living Environment:     ASAM Severity Score:    ASAM Recommended Level of Treatment:     Substance use Disorder (SUD)    Recommendations for Services/Supports/Treatments:    Discharge Disposition: Discharge Disposition Medical Exam completed: Yes  DSM5 Diagnoses: Patient Active Problem List   Diagnosis Date Noted   Palpitations 02/25/2022   Constipation 10/29/2021   Patellofemoral syndrome of left knee 06/11/2021   Carpal tunnel syndrome on both sides 06/11/2021   Chronic pain of both knees 10/23/2020   Anemia    Tobacco user 06/16/2020   Heavy menstrual bleeding 06/16/2020   Generalized anxiety disorder 06/16/2020   Iron deficiency anemia due to chronic blood loss 06/16/2020   Healthcare maintenance 06/16/2020   Ventral hernia 04/14/2019   Abnormal uterine bleeding 01/25/2018   Morbid obesity (HCC) 03/27/2014   Opioid dependence (HCC) 03/27/2014   Panic attack 03/17/2011     Referrals to Alternative Service(s): Referred to Alternative Service(s):   Place:   Date:   Time:    Referred to Alternative Service(s):   Place:   Date:   Time:    Referred to Alternative Service(s):   Place:   Date:   Time:    Referred to Alternative Service(s):   Place:   Date:   Time:     Pamalee LeydenWarrick Jr, Ryott Rafferty Ellis, [facility]

## 2022-02-27 NOTE — Discharge Instructions (Signed)
Follow-up with your primary, call tomorrow and help with the side effects you are having since stopping the paroxetine.  She may have some recommendations.  In the meantime continue taking the BuSpar.  If you develop any suicidal thoughts or homicidal thoughts, you have any concerns about your safety please return back to the ED or be helped for evaluation.

## 2022-02-27 NOTE — ED Provider Notes (Signed)
[facility] Continuous Assessment Admission H&P  Date: 02/27/22 Patient Name: Paula Leon MRN: [id number] Chief Complaint:  Chief Complaint  Patient presents with   Panic Attack      Diagnoses:  Final diagnoses:  Anxiety state  Passive suicidal ideations    HPI:   Paula Leon is a 56 year old female with a past psychiatric history significant for anxiety who [facility] due to worsening anxiety.  Patient reports that she has been experiencing anxiety accompanied by panic attacks. She states that she is often woken up by her panic attacks and often experiences bloodshot eyes, dry mouth, and elevated heart rate upon waking. Patient reports that she was recently taken off of paroxetine by her provider and started on buspirone.  Before being placed on buspirone, patient states that she experienced feeling flat when taking both methadone and paroxetine together.  Patient also endorsed the following symptoms when taking methadone and paroxetine together: brain fog and slurred speech.  Since taking buspirone, patient experiences more manageable anxiety but continues to experience heart palpitations.  Patient states that she noticed worsening of her symptoms when her son was admitted to the for the management of his mental health.  Prior to patient being admitted to the hospital, patient states that her son was living with her. Now that she is alone in the house, patient states that her panic attacks are made much worse. Patient is currently not set up with therapy but does have a past history of utilizing counseling back in 2012. Patient endorses a past history of hospitalization back in 2012 due to chronic panic attacks. Patient denies a past history of suicide attempt and further denies self harm.  Patient endorses passive suicidal ideations with no intention or plan. Due to her worsening anxiety, patient stated "What is the point of living if I have  to continue living like this." Patient denies homicidal ideations. She further denies auditory or visual hallucinations  and does not appear to be responding to internal external stimuli. Patient endorses poor sleep and receives on average 1 - 2 hours. Patient states she has been receiving less sleep since being taken off of paroxetine. Patient has a history of trazodone use with some success in managing her sleep. Patient denies alcohol consumption. Patient endorses tobacco use. 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  PHQ 2-9:  Flowsheet Row [facility]  Thoughts that you would be better off dead, or of hurting yourself in some way Not at all Not at all Not at all  PHQ-9 Total Score 0 0 6       Flowsheet Row ED from 02/23/2022 in [facility]  C-SSRS RISK CATEGORY No Risk No Risk No Risk        Total Time spent with patient: 15 minutes  Musculoskeletal  Strength & Muscle Tone: within normal limits Gait & Station: normal Patient leans: N/A  Psychiatric Specialty Exam  Presentation General Appearance: Appropriate for Environment; Casual  Eye Contact:Good  Speech:Clear and Coherent; Normal Rate  Speech Volume:Normal  Handedness:Right   Mood and Affect  Mood:Anxious; Irritable  Affect:Congruent   Thought  Process  Thought Processes:Coherent; Goal Directed  Descriptions of Associations:Intact  Orientation:Full (Time, Place and Person)  Thought Content:WDL    Hallucinations:Hallucinations: None  Ideas of Reference:None  Suicidal Thoughts:Suicidal Thoughts: Yes,  Passive SI Passive Intent and/or Plan: Without Intent; Without Plan (Patient states "What is the point of living if I have to continue living like this.")  Homicidal Thoughts:Homicidal Thoughts: No   Sensorium  Memory:Immediate Good; Recent Good; Remote Good  Judgment:Good  Insight:Good   Executive Functions  Concentration:Good  Attention Span:Good  Recall:Good  Fund of Knowledge:Good  Language:Good   Psychomotor Activity  Psychomotor Activity:Psychomotor Activity: Restlessness   Assets  Assets:Communication Skills; Desire for Improvement; Housing   Sleep  Sleep:Sleep: Poor Number of Hours of Sleep: 1.5   Nutritional Assessment (For OBS and FBC admissions only) Has the patient had a weight loss or gain of 10 pounds or more in the last 3 months?: No Has the patient had a decrease in food intake/or appetite?: No Does the patient have dental problems?: No Does the patient have eating habits or behaviors that may be indicators of an eating disorder including binging or inducing vomiting?: No Has the patient recently lost weight without trying?: 0 Has the patient been eating poorly because of a decreased appetite?: 0 Malnutrition Screening Tool Score: 0    Physical Exam Psychiatric:        Attention and Perception: Attention and perception normal. She does not perceive auditory or visual hallucinations.        Mood and Affect: Affect normal. Mood is anxious.        Speech: Speech normal.        Behavior: Behavior is agitated. Behavior is cooperative.        Thought Content: Thought content includes suicidal ideation. Thought content does not include homicidal ideation. Thought content does not include suicidal plan.        Cognition and Memory: Cognition and memory normal.        Judgment: Judgment normal.   Review of Systems  Psychiatric/Behavioral:  Positive for suicidal ideas (Patient endorsed passive suicidal ideations). Negative for depression,  hallucinations, memory loss and substance abuse. The patient is nervous/anxious and has insomnia.    Blood pressure 127/83, pulse (!) 104, temperature 97.9 F (36.6 C), temperature source Oral, resp. rate 18, SpO2 100 %. There is no height or weight on file to calculate BMI.  Past Psychiatric History:  Anxiety  Is the patient at risk to self? No  Has the patient been a risk to self in the past 6 months? No .    Has the patient been a risk to self within the distant past? No   Is the patient a risk to others? No   Has the patient been a risk to others in the past 6 months? No   Has the patient been a risk to others within the distant past? No   Past Medical History:  Past Medical History:  Diagnosis Date   Anemia    IDA   Depression    Hernia of abdominal wall    History of drug abuse (HCC)    has not used in ten years   Substance abuse (HCC)    2012 QUIT Heroin   Transfusion history    due to anemia with hb 4.0    Past Surgical History:  Procedure Laterality Date   INSERTION OF MESH N/A 04/19/2019   Procedure: INSERTION OF MESH;  Surgeon: Darnell Level, MD;  Location: [facility];  Service: General;  Laterality: N/A;   SALPINGECTOMY Left    due to ruptured ectopic   VENTRAL HERNIA REPAIR N/A 04/19/2019   Procedure: VENTRAL HERNIA REPAIR;  Surgeon: Darnell Level, MD;  Location:  SURGERY CENTER;  Service: General;  Laterality: N/A;    Family History:  Family History  Problem Relation Age of Onset   Cancer Mother    Cancer Father    Colon cancer Neg Hx    Colon polyps Neg Hx    Esophageal cancer Neg Hx    Rectal cancer Neg Hx    Stomach cancer Neg Hx     Social History:  Social History   Socioeconomic History   Marital status: Single    Spouse name: Not on file   Number of children: Not on file   Years of education: Not on file   Highest education level: Not on file  Occupational History   Not on file  Tobacco Use   Smoking status: Every  Day    Packs/day: 0.25    Types: Cigarettes   Smokeless tobacco: Never   Tobacco comments:    4-5  cigarettes per day.   Wants to stop  Vaping Use   Vaping Use: Never used  Substance and Sexual Activity   Alcohol use: Not Currently    Comment: 2012   Drug use: Not Currently    Types: Heroin, Marijuana    Comment: last time used drugs was 2012   Sexual activity: Not Currently    Birth control/protection: Surgical  Other Topics Concern   Not on file  Social History Narrative   Not on file   Social Determinants of Health   Financial Resource Strain: Medium Risk   Difficulty of Paying Living Expenses: Somewhat hard  Food Insecurity: Food Insecurity Present   Worried About Programme researcher, broadcasting/film/video in the Last Year: Often true   Barista in the Last Year: Often true  Transportation Needs: No Transportation Needs   Lack of Transportation (Medical): No   Lack of Transportation (Non-Medical): No  Physical Activity: Insufficiently Active   Days of Exercise per Week: 7 days   Minutes of Exercise per Session: 20 min  Stress: Stress Concern Present   Feeling of Stress : Very much  Social Connections: Moderately Isolated   Frequency of Communication with Friends and Family: More than three times a week   Frequency of Social Gatherings with Friends and Family: More than three times a week   Attends Religious Services: More than 4 times per year   Active Member of [employer] or Organizations: No   Attends Engineer, structural: Never   Marital Status: Never married  Catering manager Violence: Not At Risk   Fear of Current or Ex-Partner: No   Emotionally Abused: No   Physically Abused: No   Sexually Abused: No    SDOH:  SDOH Screenings   Alcohol Screen: Low Risk    Last Alcohol Screening Score (AUDIT): 0  Depression (PHQ2-9): Low Risk    PHQ-2 Score: 0  Financial Resource Strain: Medium Risk   Difficulty of Paying Living Expenses: Somewhat hard  Food Insecurity: Transport planner Present   Worried About Programme researcher, broadcasting/film/video in the Last Year: Often true   Barista in the Last Year: Often true  Housing: Low Risk    Last Housing Risk Score: 0  Physical Activity: Insufficiently Active   Days of Exercise per Week: 7  days   Minutes of Exercise per Session: 20 min  Social Connections: Moderately Isolated   Frequency of Communication with Friends and Family: More than three times a week   Frequency of Social Gatherings with Friends and Family: More than three times a week   Attends Religious Services: More than 4 times per year   Active Member of [employer] or Organizations: No   Attends Engineer, structural: Never   Marital Status: Never married  Stress: Stress Concern Present   Feeling of Stress : Very much  Tobacco Use: High Risk   Smoking Tobacco Use: Every Day   Smokeless Tobacco Use: Never   Passive Exposure: Not on file  Transportation Needs: No Transportation Needs   Lack of Transportation (Medical): No   Lack of Transportation (Non-Medical): No    Last Labs:  Office Visit on 02/25/2022  Component Date Value Ref Range Status   Total Iron Binding Capacity 02/25/2022 387  250 - 450 ug/dL Final   UIBC 16/06/9603 365  131 - 425 ug/dL Final   Iron 54/05/8118 22 (L)  27 - 159 ug/dL Final   Iron Saturation 02/25/2022 6 (LL)  15 - 55 % Final   Ferritin 02/25/2022 10 (L)  15 - 150 ng/mL Final  Admission on 02/23/2022, Discharged on 02/23/2022  Component Date Value Ref Range Status   WBC 02/23/2022 4.4  4.0 - 10.5 K/uL Final   RBC 02/23/2022 4.51  3.87 - 5.11 MIL/uL Final   Hemoglobin 02/23/2022 9.9 (L)  12.0 - 15.0 g/dL Final   HCT 14/78/2956 33.8 (L)  36.0 - 46.0 % Final   MCV 02/23/2022 74.9 (L)  80.0 - 100.0 fL Final   MCH 02/23/2022 22.0 (L)  26.0 - 34.0 pg Final   MCHC 02/23/2022 29.3 (L)  30.0 - 36.0 g/dL Final   RDW 21/30/8657 18.0 (H)  11.5 - 15.5 % Final   Platelets 02/23/2022 225  150 - 400 K/uL Final   nRBC 02/23/2022 0.0   0.0 - 0.2 % Final   Performed at [facility], [address] [place], [place] [address]   Sodium 02/23/2022 137  135 - 145 mmol/L Final   Potassium 02/23/2022 3.6  3.5 - 5.1 mmol/L Final   Chloride 02/23/2022 104  98 - 111 mmol/L Final   CO2 02/23/2022 26  22 - 32 mmol/L Final   Glucose, Bld 02/23/2022 92  70 - 99 mg/dL Final   Glucose reference range applies only to samples taken after fasting for at least 8 hours.   BUN 02/23/2022 13  6 - 20 mg/dL Final   Creatinine, Ser 02/23/2022 0.80  0.44 - 1.00 mg/dL Final   Calcium 29/52/8413 9.1  8.9 - 10.3 mg/dL Final   Total Protein 24/40/1027 6.9  6.5 - 8.1 g/dL Final   Albumin 25/36/6440 3.5  3.5 - 5.0 g/dL Final   AST 34/74/2595 16  15 - 41 U/L Final   ALT 02/23/2022 13  0 - 44 U/L Final   Alkaline Phosphatase 02/23/2022 95  38 - 126 U/L Final   Total Bilirubin 02/23/2022 0.3  0.3 - 1.2 mg/dL Final   GFR, Estimated 02/23/2022 >60  >60 mL/min Final   Comment: (NOTE) Calculated using the CKD-EPI Creatinine Equation (2021)    Anion gap 02/23/2022 7  5 - 15 Final   Performed at [facility], [address] [place], [place] [address]   TSH 02/23/2022 1.544  0.350 - 4.500 uIU/mL Final  Comment: Performed by a 3rd Generation assay with a functional sensitivity of <=0.01 uIU/mL. Performed at [facility], [address], [place], [place] [address]   Admission on 02/21/2022, Discharged on 02/21/2022  Component Date Value Ref Range Status   Sodium 02/21/2022 138  135 - 145 mmol/L Final   Potassium 02/21/2022 4.0  3.5 - 5.1 mmol/L Final   Chloride 02/21/2022 106  98 - 111 mmol/L Final   CO2 02/21/2022 26  22 - 32 mmol/L Final   Glucose, Bld 02/21/2022 96  70 - 99 mg/dL Final   Glucose reference range applies only to samples taken after fasting for at least 8 hours.   BUN 02/21/2022 11  6 - 20 mg/dL Final   Creatinine, Ser 02/21/2022 0.84  0.44 - 1.00 mg/dL Final   Calcium 64/38/3818 9.1  8.9 - 10.3 mg/dL Final   GFR,  Estimated 02/21/2022 >60  >60 mL/min Final   Comment: (NOTE) Calculated using the CKD-EPI Creatinine Equation (2021)    Anion gap 02/21/2022 6  5 - 15 Final   Performed at [facility], [address], [place], [place] [address]   WBC 02/21/2022 4.0  4.0 - 10.5 K/uL Final   RBC 02/21/2022 4.68  3.87 - 5.11 MIL/uL Final   Hemoglobin 02/21/2022 10.5 (L)  12.0 - 15.0 g/dL Final   HCT 43/60/6770 34.9 (L)  36.0 - 46.0 % Final   MCV 02/21/2022 74.6 (L)  80.0 - 100.0 fL Final   MCH 02/21/2022 22.4 (L)  26.0 - 34.0 pg Final   MCHC 02/21/2022 30.1  30.0 - 36.0 g/dL Final   RDW 34/11/5246 18.1 (H)  11.5 - 15.5 % Final   Platelets 02/21/2022 227  150 - 400 K/uL Final   nRBC 02/21/2022 0.0  0.0 - 0.2 % Final   Performed at [facility], [address], [place], [place] [address]   Troponin I (High Sensitivity) 02/21/2022 <2  <18 ng/L Final   Comment: (NOTE) Elevated high sensitivity troponin I (hsTnI) values and significant  changes across serial measurements may suggest ACS but many other  chronic and acute conditions are known to elevate hsTnI results.  Refer to the "Links" section for chest pain algorithms and additional  guidance. Performed at [facility], [address] [place], [place] [address]    I-stat hCG, quantitative 02/21/2022 <5.0  <5 mIU/mL Final   Comment 3 02/21/2022          Final   Comment:   GEST. AGE      CONC.  (mIU/mL)   <=1 WEEK        5 - 50     2 WEEKS       50 - 500     3 WEEKS       100 - 10,000     4 WEEKS     1,000 - 30,000        FEMALE AND NON-PREGNANT FEMALE:     LESS THAN 5 mIU/mL   Admission on 02/01/2022, Discharged on 02/02/2022  Component Date Value Ref Range Status   Sodium 02/01/2022 137  135 - 145 mmol/L Final   Potassium 02/01/2022 3.8  3.5 - 5.1 mmol/L Final   Chloride 02/01/2022 106  98 - 111 mmol/L Final   CO2 02/01/2022 24  22 - 32 mmol/L Final   Glucose, Bld 02/01/2022 95  70 - 99 mg/dL Final   Glucose reference range  applies only to  samples taken after fasting for at least 8 hours.   BUN 02/01/2022 13  6 - 20 mg/dL Final   Creatinine, Ser 02/01/2022 0.93  0.44 - 1.00 mg/dL Final   Calcium 75/91/6384 9.3  8.9 - 10.3 mg/dL Final   GFR, Estimated 02/01/2022 >60  >60 mL/min Final   Comment: (NOTE) Calculated using the CKD-EPI Creatinine Equation (2021)    Anion gap 02/01/2022 7  5 - 15 Final   Performed at [facility], [address] [place], [place] [address]   WBC 02/01/2022 6.2  4.0 - 10.5 K/uL Final   RBC 02/01/2022 4.61  3.87 - 5.11 MIL/uL Final   Hemoglobin 02/01/2022 10.1 (L)  12.0 - 15.0 g/dL Final   HCT 35/70/1779 34.0 (L)  36.0 - 46.0 % Final   MCV 02/01/2022 73.8 (L)  80.0 - 100.0 fL Final   MCH 02/01/2022 21.9 (L)  26.0 - 34.0 pg Final   MCHC 02/01/2022 29.7 (L)  30.0 - 36.0 g/dL Final   RDW 39/11/90 17.1 (H)  11.5 - 15.5 % Final   Platelets 02/01/2022 197  150 - 400 K/uL Final   nRBC 02/01/2022 0.0  0.0 - 0.2 % Final   Performed at [facility], [address], [place], [place] [address]    Allergies: Penicillins  PTA Medications: (Not in a hospital admission)   Medical Decision Making  Based on my assessment, patient meets criteria for admission to [facility] for continuous observation due to passive suicidal ideations in the presence of worsening anxiety. Admission labs to be ordered and initiated before patient is admitted to the floor. Patient to be reassess and revaluated by psychiatry in the morning.  Recommendations  Based on my evaluation the patient does not appear to have an emergency medical condition.  Meta Hatchet, PA 02/27/22  4:20 AM

## 2022-02-27 NOTE — ED Provider Notes (Signed)
[facility] EMERGENCY DEPARTMENT Provider Note   CSN: [id number] Arrival date & time: 02/27/22  1605     History  Chief Complaint  Patient presents with   Anxiety    Paula Leon is a 56 y.o. female.   Anxiety   Patient with history of substance use disorder on methadone for the last 12 years presents today due to anxiety.  She has been on paroxetine for anxiety for some number of time, her doctor was titrating her down and she recently discontinued it 2 days ago.  States she has been feeling anxious since then, was seen at behavioral health and admitted due to passive suicidal ideations and worsening anxiety state.  She was given a trazodone earlier today and left AGAINST MEDICAL ADVICE because she was feeling worsening anxiety.  She states she has not slept in 24 hours, she denies any SI or HI to me.  Not having any hallucinations or delusions.  She feels safe at home, she was brought to the ED by her daughter.  States she does not want psychiatric evaluation at this time, states she just feels really anxious and wants to know why.  Home Medications Prior to Admission medications   Medication Sig Start Date End Date Taking? Authorizing Provider  busPIRone (BUSPAR) 5 MG tablet Take 1 tablet (5 mg total) by mouth 2 (two) times daily. 02/25/22 05/26/22  Andrey Campanile, MD  diclofenac Sodium (VOLTAREN) 1 % GEL Apply 4 g topically 4 (four) times daily. Patient taking differently: Apply 4 g topically 4 (four) times daily as needed (joint pain). 06/11/21   Atway, Rayann N, DO  DULoxetine (CYMBALTA) 30 MG capsule Take 1 capsule (30 mg total) by mouth daily. Patient not taking: Reported on 02/21/2022 02/02/22 05/03/22  Dellis Filbert, MD  ferrous sulfate 325 (65 FE) MG tablet Take 1 tablet (325 mg total) by mouth daily with breakfast. Patient not taking: Reported on 03/26/2021 10/23/20   Lenward Chancellor D, DO  hydrOXYzine (ATARAX/VISTARIL) 10 MG tablet Take 2.5 tablets (25 mg  total) by mouth every 8 (eight) hours as needed. Patient taking differently: Take 10 mg by mouth every 8 (eight) hours as needed for anxiety. 08/04/21   Carmel Sacramento, MD  methadone (DOLOPHINE) 10 MG/5ML solution Take 35 mg by mouth every morning.    [provider]  famotidine (PEPCID) 20 MG tablet Take 1 tablet (20 mg total) by mouth 2 (two) times daily. Patient not taking: Reported on 09/03/2019 07/28/19 03/10/20  Merrilee Jansky, MD      Allergies    Penicillins    Review of Systems   Review of Systems  Physical Exam Updated Vital Signs BP 93/65   Pulse (!) 116   Temp 98.7 F (37.1 C) (Oral)   Resp 12   SpO2 97%  Physical Exam Vitals and nursing note reviewed. Exam conducted with a chaperone present.  Constitutional:      Appearance: Normal appearance.  HENT:     Head: Normocephalic and atraumatic.  Eyes:     General: No scleral icterus.       Right eye: No discharge.        Left eye: No discharge.     Extraocular Movements: Extraocular movements intact.     Pupils: Pupils are equal, round, and reactive to light.  Cardiovascular:     Rate and Rhythm: Regular rhythm. Tachycardia present.     Pulses: Normal pulses.     Heart sounds: Normal heart sounds. No  murmur heard.   No friction rub. No gallop.  Pulmonary:     Effort: Pulmonary effort is normal. No respiratory distress.     Breath sounds: Normal breath sounds.  Abdominal:     General: Abdomen is flat. Bowel sounds are normal. There is no distension.     Palpations: Abdomen is soft.     Tenderness: There is no abdominal tenderness.  Skin:    General: Skin is warm and dry.     Coloration: Skin is not jaundiced.  Neurological:     Mental Status: She is alert. Mental status is at baseline.     Coordination: Coordination normal.  Psychiatric:     Comments: Anxious.  Not responding to internal stimuli, no SI or HI.    ED Results / Procedures / Treatments   Labs (all labs ordered are listed, but only  abnormal results are displayed) Labs Reviewed - No data to display  EKG None  Radiology No results found.  Procedures Procedures    Medications Ordered in ED Medications  LORazepam (ATIVAN) tablet 0.5 mg (0 mg Oral Hold 02/27/22 2002)    ED Course/ Medical Decision Making/ A&P                           Medical Decision Making Risk Prescription drug management.   Patient presents due to anxiety.    Reviewed external records including her earlier visit today with behavioral health.  She was given tramadol and left AGAINST MEDICAL ADVICE but did not meet any IVC criteria.  Reviewed the laboratory work-up from today's visit, she has a stable anemia with a hemoglobin of 10.3 which is roughly baseline compared to chart review.  CMP without gross electrolyte derangement or AKI.  TSH, ethanol and lipid panel were unremarkable.  I ordered an EKG given patient was tachycardic during triage at 116, she is in sinus rhythm with heart rate of 91.  Patient was put on cardiac monitoring, I think her symptoms are most likely secondary to quit the paroxetine and adding the new BuSpar.  I discussed this with the patient, we discussed psychiatric evaluation here but patient is not interested in that.  She denies any SI or HI, does not have any psychotic features and she is not impulsive.    She was On cardiac monitoring and observed, on reevaluation patient is feeling better and after taking a nap.  Given no SI or HI, she has a psychiatrist she is going to follow-up regarding medication changes encouraged her to continue taking the busbar.  We did offer Atarax and Ambien to the patient but she declined stating she does not want to add any new medications to the mix.  I think that is reasonable.  Patient was able to sleep on the cardiac monitor.  Her heart rate improved and is in the 90s.  Not febrile, no hypoxia.  She reiterates that she has no SI or HI, she feels safe to go home.  At this time I think  she is appropriate for outpatient follow-up with her PCP and strict return precautions, she is discharged in stable condition.        Final Clinical Impression(s) / ED Diagnoses Final diagnoses:  Anxiety    Rx / DC Orders ED Discharge Orders     None         Theron Arista, Cordelia Poche 02/27/22 2056    Terald Sleeper, MD 02/28/22 1134

## 2022-02-27 NOTE — Discharge Instructions (Addendum)
Patient to keep all scheduled future follow up appointments

## 2022-02-27 NOTE — ED Notes (Signed)
No answer

## 2022-02-27 NOTE — ED Notes (Signed)
PA Otila Back at bedside to speak with pt.  Pt signed AMA form.

## 2022-02-27 NOTE — ED Triage Notes (Signed)
Patient having anxiety and will not sit down or sit still.  Patient went to UC and had passive SI to them but none in triage.

## 2022-02-27 NOTE — ED Provider Notes (Signed)
FBC/OBS ASAP Discharge Summary  Date and Time: 02/27/2022 6:03 AM  Name: Paula Leon  MRN:  [id number]   Discharge Diagnoses:  Final diagnoses:  Anxiety state  Passive suicidal ideations    Subjective:   Paula Leon is a 56 year old female with a past psychiatric history significant for anxiety who presents to [facility]gent care due to passive suicidal ideations in the presence of worsening anxiety. Once patient was admitted to the floor, patient was given a trazodone to encourage sleep. Once the medication was given to the patient, patient experienced elevated anxiety along with instances of panic.  During patient's elevated anxiety, nurse and nurse techs were present to try to alleviate her anxiety. Patient refused medications for the alleviations of her anxiety and eventually requested to leave against medical advice from the encounter. During the assessment, patient states that she is feeling good/better. When asked what happened with her anxiety, patient stated that she could feel the trazodone trying to put her to sleep. Patient states that she is comfortable with leaving. She denies suicidal or homicidal ideations. She further denies auditory or visual hallucinations. Patient denies being a danger to herself and is able to contract for safety. Although her son is in the hospital, patient states that her daughter lives up the road from her. AMA form was signed by the patient and a safety plan was discussed and established prior to the patient being discharged from the facility.  Stay Summary:   Paula Leon is a 56 year old female with a past psychiatric history significant for anxiety who presents to [facility] Urgent care due to passive suicidal ideations in the presence of worsening anxiety.   After being admitted to continuous observation, patient experienced worsening anxiety and panic when given trazodone. Once patient was able to calm down  some, patient requested to leave the facility AMA. Patient denied suicidal or homicidal ideations. She further denied auditory and visual hallucinations and did not appear to be responding to internal/external stimuli. Patient to be discharged from the facility AMA. The appropriate form was signed prior to patient leaving the facility.  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   Transfusion history    due to anemia with hb 4.0    Past Surgical History:  Procedure Laterality Date   INSERTION OF MESH N/A 04/19/2019   Procedure: INSERTION OF MESH;  Surgeon: Darnell Level, MD;  Location: [facility];  Service: General;  Laterality: N/A;   SALPINGECTOMY Left    due to ruptured ectopic   VENTRAL HERNIA REPAIR N/A 04/19/2019   Procedure: VENTRAL HERNIA REPAIR;  Surgeon: Darnell Level, MD;  Location: [facility];  Service: General;  Laterality: N/A;   Family History:  Family History  Problem Relation Age of Onset   Cancer Mother    Cancer Father    Colon cancer Neg Hx    Colon polyps Neg Hx    Esophageal cancer Neg Hx    Rectal cancer Neg Hx    Stomach cancer Neg Hx    Family Psychiatric History: Unknown  Social History:  Social History  Substance and Sexual Activity  Alcohol Use Not Currently   Comment: 2012     Social History   Substance and Sexual Activity  Drug Use Not Currently   Types: Heroin, Marijuana   Comment: last time used drugs was 2012    Social History   Socioeconomic History   Marital status: Single    Spouse name: Not on file   Number of children: Not on file   Years of education: Not on file   Highest education level: Not on file  Occupational  History   Not on file  Tobacco Use   Smoking status: Every Day    Packs/day: 0.25    Types: Cigarettes   Smokeless tobacco: Never   Tobacco comments:    4-5  cigarettes per day.   Wants to stop  Vaping Use   Vaping Use: Never used  Substance and Sexual Activity   Alcohol use: Not Currently    Comment: 2012   Drug use: Not Currently    Types: Heroin, Marijuana    Comment: last time used drugs was 2012   Sexual activity: Not Currently    Birth control/protection: Surgical  Other Topics Concern   Not on file  Social History Narrative   Not on file   Social Determinants of Health   Financial Resource Strain: Medium Risk   Difficulty of Paying Living Expenses: Somewhat hard  Food Insecurity: Food Insecurity Present   Worried About Programme researcher, broadcasting/film/video in the Last Year: Often true   Barista in the Last Year: Often true  Transportation Needs: No Transportation Needs   Lack of Transportation (Medical): No   Lack of Transportation (Non-Medical): No  Physical Activity: Insufficiently Active   Days of Exercise per Week: 7 days   Minutes of Exercise per Session: 20 min  Stress: Stress Concern Present   Feeling of Stress : Very much  Social Connections: Moderately Isolated   Frequency of Communication with Friends and Family: More than three times a week   Frequency of Social Gatherings with Friends and Family: More than three times a week   Attends Religious Services: More than 4 times per year   Active Member of [employer] or Organizations: No   Attends Engineer, structural: Never   Marital Status: Never married   SDOH:  SDOH Screenings   Alcohol Screen: Low Risk    Last Alcohol Screening Score (AUDIT): 0  Depression (PHQ2-9): Low Risk    PHQ-2 Score: 0  Financial Resource Strain: Medium Risk   Difficulty of Paying Living Expenses: Somewhat hard  Food Insecurity: Geophysicist/field seismologist Present   Worried About Programme researcher, broadcasting/film/video in the Last Year: Often true   Occupational psychologist in the Last Year: Often true  Housing: Low Risk    Last Housing Risk Score: 0  Physical Activity: Insufficiently Active   Days of Exercise per Week: 7 days   Minutes of Exercise per Session: 20 min  Social Connections: Moderately Isolated   Frequency of Communication with Friends and Family: More than three times a week   Frequency of Social Gatherings with Friends and Family: More than three times a week   Attends Religious Services: More than 4 times per year   Active Member of [employer] or Organizations: No   Attends Banker Meetings: Never   Marital Status: Never married  Stress: Stress Concern Present   Feeling of Stress : Very much  Tobacco Use: High Risk  Smoking Tobacco Use: Every Day   Smokeless Tobacco Use: Never   Passive Exposure: Not on file  Transportation Needs: No Transportation Needs   Lack of Transportation (Medical): No   Lack of Transportation (Non-Medical): No    Tobacco Cessation:  N/A, patient does not currently use tobacco products  Current Medications:  Current Facility-Administered Medications  Medication Dose Route Frequency Provider Last Rate Last Admin   acetaminophen (TYLENOL) tablet 650 mg  650 mg Oral Q6H PRN Safiyya Stokes E, PA       alum & mag hydroxide-simeth (MAALOX/MYLANTA) 200-200-20 MG/5ML suspension 30 mL  30 mL Oral Q4H PRN Iraida Cragin E, PA       busPIRone (BUSPAR) tablet 5 mg  5 mg Oral BID Adriana Quinby E, PA       hydrOXYzine (ATARAX) tablet 25 mg  25 mg Oral TID PRN Eulogio Requena E, PA       magnesium hydroxide (MILK OF MAGNESIA) suspension 30 mL  30 mL Oral Daily PRN Dariusz Brase E, PA       methadone (DOLOPHINE) 10 MG/5ML solution 35 mg  35 mg Oral q morning Yvette Loveless E, PA       traZODone (DESYREL) tablet 50 mg  50 mg Oral QHS Wilkes Potvin E, PA   50 mg at 02/27/22 0500   Current Outpatient Medications  Medication Sig Dispense Refill   busPIRone (BUSPAR) 5 MG tablet Take 1 tablet (5 mg total)  by mouth 2 (two) times daily. 180 tablet 0   diclofenac Sodium (VOLTAREN) 1 % GEL Apply 4 g topically 4 (four) times daily. (Patient taking differently: Apply 4 g topically 4 (four) times daily as needed (joint pain).) 150 g 1   DULoxetine (CYMBALTA) 30 MG capsule Take 1 capsule (30 mg total) by mouth daily. (Patient not taking: Reported on 02/21/2022) 90 capsule 0   ferrous sulfate 325 (65 FE) MG tablet Take 1 tablet (325 mg total) by mouth daily with breakfast. (Patient not taking: Reported on 03/26/2021) 90 tablet 0   hydrOXYzine (ATARAX/VISTARIL) 10 MG tablet Take 2.5 tablets (25 mg total) by mouth every 8 (eight) hours as needed. (Patient taking differently: Take 10 mg by mouth every 8 (eight) hours as needed for anxiety.) 30 tablet 2   methadone (DOLOPHINE) 10 MG/5ML solution Take 35 mg by mouth every morning.      PTA Medications: (Not in a hospital admission)   Musculoskeletal  Strength & Muscle Tone: within normal limits Gait & Station: normal Patient leans: N/A  Psychiatric Specialty Exam  Presentation  General Appearance: Appropriate for Environment; Casual  Eye Contact:Good  Speech:Clear and Coherent; Normal Rate  Speech Volume:Normal  Handedness:Right   Mood and Affect  Mood:Anxious; Irritable  Affect:Congruent   Thought Process  Thought Processes:Coherent; Goal Directed  Descriptions of Associations:Intact  Orientation:Full (Time, Place and Person)  Thought Content:WDL  Diagnosis of Schizophrenia or Schizoaffective disorder in past: No    Hallucinations:Hallucinations: None  Ideas of Reference:None  Suicidal Thoughts:Suicidal Thoughts: Yes, Passive SI Passive Intent and/or Plan: Without Intent; Without Plan (Patient states "What is the point of living if I have to continue living like this.")  Homicidal Thoughts:Homicidal Thoughts: No   Sensorium  Memory:Immediate Good; Recent Good; Remote Good  Judgment:Good  Insight:Good   Executive  Functions  Concentration:Good  Attention Span:Good  Recall:Good  Fund of Knowledge:Good  Language:Good   Psychomotor Activity  Psychomotor Activity:Psychomotor Activity: Restlessness   Assets  Assets:Communication Skills; Desire for Improvement; Housing   Sleep  Sleep:Sleep: Poor Number of Hours of Sleep: 1.5   Nutritional Assessment (For OBS and FBC admissions only) Has the patient had a weight loss or gain of 10 pounds or more in the last 3 months?: No Has the patient had a decrease in food intake/or appetite?: No Does the patient have dental problems?: No Does the patient have eating habits or behaviors that may be indicators of an eating disorder including binging or inducing vomiting?: No Has the patient recently lost weight without trying?: 0 Has the patient been eating poorly because of a decreased appetite?: 0 Malnutrition Screening Tool Score: 0    Physical Exam  Physical Exam ROS Blood pressure 127/83, pulse (!) 104, temperature 97.9 F (36.6 C), temperature source Oral, resp. rate 18, SpO2 100 %. There is no height or weight on file to calculate BMI.  Demographic Factors:  Living alone  Loss Factors: NA  Historical Factors: Family history of mental illness or substance abuse  Risk Reduction Factors:   Sense of responsibility to family, Living with another person, especially a relative, and Positive social support  Continued Clinical Symptoms:  Severe Anxiety and/or Agitation Panic Attacks  Cognitive Features That Contribute To Risk:  None    Suicide Risk:  Minimal: No identifiable suicidal ideation.  Patients presenting with no risk factors but with morbid ruminations; may be classified as minimal risk based on the severity of the depressive symptoms  Plan Of Care/Follow-up recommendations:  Activity:  Patient was encouraged to get rest/sleep following discharge from the facility  Disposition: Patient to be discharged from the facility  AMA.   Meta Hatchet, PA 02/27/2022, 6:03 AM

## 2022-02-27 NOTE — ED Notes (Signed)
Patient is extremely anxious. I gave her 50 mg of trazodone  Patient state that "that is too much it will make me more anxious.". Patient feels anxious does not want to be alone,"I want the panic to stop", "I don;t like feeling this way". Patient saw the provider for a second time now. Will continue to monitor for safety.

## 2022-02-27 NOTE — ED Provider Triage Note (Signed)
Emergency Medicine Provider Triage Evaluation Note  Paula Leon , a 56 y.o. female  was evaluated in triage.  Pt complains of anxiety. She was seen for the same at behavioral care early this morning and signed out AMA. She recently had her medication changed.  She has been taking BuSpar for 3 days and had a different medication..  She feels like she has been having back-to-back panic attacks all day today.  She had extensive labs drawn at behavioral health urgent care this morning.  Physical Exam  BP 128/87 (BP Location: Right Arm)   Pulse (!) 116   Temp 98.7 F (37.1 C) (Oral)   Resp 20   SpO2 97%  Gen:   Awake, appears anxious Resp:  Normal effort  MSK:   Moves extremities without difficulty  Other:  No SI. HI or AVH  Medical Decision Making  Medically screening exam initiated at 4:32 PM.  Appropriate orders placed.  Felisia Balcom was informed that the remainder of the evaluation will be completed by another provider, this initial triage assessment does not replace that evaluation, and the importance of remaining in the ED until their evaluation is complete.     Cristina Gong, [place] 02/27/22 1633

## 2022-02-27 NOTE — Progress Notes (Signed)
   02/27/22 0321  [facility] Triage Screening (Walk-ins at [facility] only)  What Is the Reason for Your Visit/Call Today? Pt presents to [facility] unaccompanied and voluntarily. Pt states that she has increased panic attack and reports "what's the point of living if I have to live like this." Pt endorses SI ideation without a plan. Pt states that she recently had a medication change and does not believe that the medication is assisting with her anxiety. 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 Pt denies HI and AVH. Pt states that she is not depressed but feels like she will become depressed if she has to live life like this. Pt states that she has increased tension in her shoulders and can not sleep. Pt states that she does not want to be alone. Pt reports that her son is currently inpatient with mental health and shares that she is living at home alone. Pt reports that she has support.Pt also reports that she lost her 3rd son a few years ago. Pt is not currently in therapy but shares that it was helpful in the past. CSW staffed case with provider E [place], PA and pt is emergent.  How Long Has This Been Causing You Problems? 1 wk - 1 month  Have You Recently Had Any Thoughts About Hurting Yourself? Yes  How long ago did you have thoughts about hurting yourself? "recently since medicaiton change."  Are You Planning to Commit Suicide/Harm Yourself At This time? No  Have you Recently Had Thoughts About [place]? No  Are You Planning To Harm Someone At This Time? No  Are you currently experiencing any auditory, visual or other hallucinations? No  Have You Used Any Alcohol or Drugs in the Past 24 Hours? No  Do you have any current medical co-morbidities that require immediate attention? No  Clinician description of patient physical appearance/behavior: Pt alert and oriented x5. Pt appears anxious.  What Do You Feel Would Help You the Most Today? Treatment for Depression or other mood problem  If  access to [facility] was not available, would you have sought care in the Emergency Department? Yes  Determination of Need Emergent (2 hours)  Options For Referral Facility-Based Crisis;Mobile Crisis;Medication Management;Intensive Outpatient Therapy   Benjaman Kindler, MSW, [facility] 02/27/2022 4:07 AM

## 2022-02-28 ENCOUNTER — Encounter (HOSPITAL_COMMUNITY): Payer: Self-pay | Admitting: Emergency Medicine

## 2022-02-28 ENCOUNTER — Ambulatory Visit (INDEPENDENT_AMBULATORY_CARE_PROVIDER_SITE_OTHER): Payer: Medicare Other | Admitting: Internal Medicine

## 2022-02-28 ENCOUNTER — Telehealth: Payer: Self-pay | Admitting: Internal Medicine

## 2022-02-28 ENCOUNTER — Emergency Department (HOSPITAL_COMMUNITY)
Admission: EM | Admit: 2022-02-28 | Discharge: 2022-02-28 | Discharge status: Against Medical Advice | Payer: Medicare Other | Attending: Emergency Medicine | Admitting: Emergency Medicine

## 2022-02-28 ENCOUNTER — Telehealth: Payer: Self-pay

## 2022-02-28 ENCOUNTER — Ambulatory Visit: Payer: Medicare Other | Admitting: Behavioral Health

## 2022-02-28 DIAGNOSIS — Z5321 Procedure and treatment not carried out due to patient leaving prior to being seen by health care provider: Secondary | ICD-10-CM | POA: Diagnosis not present

## 2022-02-28 DIAGNOSIS — F419 Anxiety disorder, unspecified: Secondary | ICD-10-CM | POA: Diagnosis present

## 2022-02-28 DIAGNOSIS — D500 Iron deficiency anemia secondary to blood loss (chronic): Secondary | ICD-10-CM

## 2022-02-28 DIAGNOSIS — F411 Generalized anxiety disorder: Secondary | ICD-10-CM | POA: Diagnosis present

## 2022-02-28 DIAGNOSIS — F331 Major depressive disorder, recurrent, moderate: Secondary | ICD-10-CM

## 2022-02-28 MED ORDER — LORAZEPAM 0.5 MG PO TABS: 0.2500 mg | ORAL_TABLET | Freq: Once | ORAL | Status: DC

## 2022-02-28 NOTE — Patient Instructions (Signed)
Thank you, Ms.[name] for allowing [place] to provide your care today. Today we discussed your anxiety.  I am happy that eating more food helped with your anxiety! Continue taking your buspar twice a day, with breakfast and with dinner.  We discussed your iron studies and recommendation to start iron but given that you have chosen to not start this yet, I would like to see you in a week for a telehealth appointment.    I have ordered the following labs for you:  Lab Orders  No laboratory test(s) ordered today     Referrals ordered today:   Referral Orders  No referral(s) requested today     I have ordered the following medication/changed the following medications:     Follow up:  1 week for a telehealth visit     Should you have any questions or concerns please call the internal medicine clinic at [phone].

## 2022-02-28 NOTE — Telephone Encounter (Signed)
Call was transferred. Pt has many questions about which medications she should be taking and the ones she prefers not to take. Stated she trying to get off Methadone -stated she had already talked to the methadone clinic. Informed pt she can take Buspar twice a day as prescribed by Dr Alroy Bailiff 6/2 - stated it worked for her then the other night,  she could not sleep after taking it at nighttime. She's not taking Hydroxyzine. Over the w/e, She was seen at [facility] ED and was given Trazodone. Then seen at [facility] ED. Pt has many questions about her medications - I told her , I will have the doctor to call her back.

## 2022-02-28 NOTE — Progress Notes (Signed)
   CC: anxiety  HPI:  [name] is a 56 y.o. with past medical history as noted below who presents to the clinic today for anxiety. Please see problem-based list for further details, assessments, and plans.   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   Physical Exam:  There were no vitals filed for this visit. - see ED triage note General: sweaty-appearing, anxious, and pacing the room HE: Normocephalic, atraumatic ENT: Deferred Cardiovascular: Tachycardic, regular rhythm. No murmurs, rubs, or gallops Pulmonary: Tachypneic, normal WOB on RA Abdominal: Deferred Musculoskeletal: Deferred Skin: Deferred Psychiatric/Behavioral: anxious and pacing the room  Assessment & Plan:   See Encounters Tab for problem based charting.  Patient discussed with Dr. Mayford Knife

## 2022-02-28 NOTE — Telephone Encounter (Signed)
Attempted to contact patient to clarify which medications she should be taking for her anxiety. Also reviewed recent labs which showed findings consistent with IDA. Called patient but did not receive an answer. Unable to leave VM as mailbox is full. Will reattempt at a later time.

## 2022-02-28 NOTE — ED Triage Notes (Signed)
Patient here with complaint of anxiety that started a few days ago after changing for paroxetine to Buspar and increasing her methadone from 35mg  to 38mg . Patient is alert, oriented, and pacing around the triage room.

## 2022-02-28 NOTE — BH Specialist Note (Signed)
Integrated Behavioral Health via Telemedicine Visit  02/28/2022 Paula Leon [id number]  Number of Integrated Behavioral Health Clinician visits: 1st complete visit on telehealth conducted today. Prior visits have been in [facility] per Physician consult. Pt has missed the fllwg calls: 12/14/21, 11/26/21, 08/10/21, 07/20/22, 06/29/21, 05/13/21, & 04/26/21 Session Start time: Reached Pt via telehealth @ 9:10am Session End time: 9:45am Total time in minutes: 35 min  Referring Provider: Dr. Dellis Filbert, MD & Dr. Kathrynn Speed, MD & Dr. Toma Aran, MD Patient/Family location: Pt is in private [facility] Provider location: [facility] All persons participating in visit: Pt & Clinician Types of Service: Individual psychotherapy  I connected with [name] and/or Cloyd Stagers  self  via  Telephone or Video Enabled Telemedicine Application  (Video is Caregility application) and verified that I am speaking with the correct person using two identifiers. Discussed confidentiality: Yes   I discussed the limitations of telemedicine and the availability of in person appointments.  Discussed there is a possibility of technology failure and discussed alternative modes of communication if that failure occurs.  I discussed that engaging in this telemedicine visit, they consent to the provision of behavioral healthcare and the services will be billed under their insurance.  Patient and/or legal guardian expressed understanding and consented to Telemedicine visit: Yes   Presenting Concerns: Patient and/or family reports the following symptoms/concerns: Pt was in the ED over the wknd after overdoing it on Sat while @ home cleaning. She has been dealing w/stressful Family issues & was admitted to [facility] overnight. She was given new medication @ that time. Directed Pt to fllw the written instructions of the Prescribing Physician for administration of medication. Duration of problem: Pt has been dealing w/elevated anx/with panic  for over a month. Her methodone has [facility] 38[facility] ; Severity of problem: moderate trending severe  Patient and/or Family's Strengths/Protective Factors: Concrete supports in place (healthy food, safe environments, etc.), Physical Health (exercise, healthy diet, medication compliance, etc.), and Parental Resilience  Goals Addressed: Patient will:  Reduce symptoms of: anxiety, depression, mood instability, stress, and sleep issues she is resolving    Increase knowledge and/or ability of: coping skills, self-management skills, and stress reduction   Demonstrate ability to: Increase healthy adjustment to current life circumstances  Progress towards Goals: Discontinued today: Pt will continue Tx with alternate Provider.   Interventions: Interventions utilized:  Solution-Focused Strategies and Supportive Counseling Standardized Assessments completed:  screeners prn  Patient and/or Family Response: Pt was receptive to call today explaining her situation over the wknd, her Tx & her successes. Pt was d/c'd from [facility] today @ 5:00am, but still wishes to take call for psychotherapy as scheduled.   Pt became belligerent @ conclusion of session as Clinician has a pending appt right after her visit. Pt became angry & hung up phone on Clinician.  Pt can be referred to an alternate Provider per her wishes.   Assessment: Patient currently experiencing elevated anx/dep & concern for her mental, medical, & health in Recovery. Pt has been "clean" for 11 yrs attending AA & working her Recovery actively.   Pt contacts her Sponsor weekly & throughout the week prn. She agrees to fllw the Prescribing Physician's instructions.   Pt given input on Deep Breathing exercises & the use of calm.com for support. Pt has told her Sons she is blocking them bc she is under a great deal of stress & has to focus on her own wellbeing. Supported Pt  in her growth & progress today. Provided  resources & tips on how to deal w/her anx. Emphasized to Pt her desire to use self-care practices & how this will empower her further & benefit her when psychological overwhelm happens.  Patient may benefit from Referral to Alternate Provider so Pt can gain more skills & further support.  Plan: Follow up with behavioral health clinician on : Therapy has been terminated today per Pt request. Behavioral recommendations: None @ this time Referral(s):  [facility], [facility], [facility]  I discussed the assessment and treatment plan with the patient and/or parent/guardian. They were provided an opportunity to ask questions and all were answered. They agreed with the plan and demonstrated an understanding of the instructions.   They were advised to call back or seek an in-person evaluation if the symptoms worsen or if the condition fails to improve as anticipated.  Deneise Lever, LMFT

## 2022-02-28 NOTE — ED Notes (Signed)
Pt called by ED staff and registration, no answer

## 2022-02-28 NOTE — ED Provider Triage Note (Addendum)
Emergency Medicine Provider Triage Evaluation Note  Avry Monteleone , a 56 y.o. female  was evaluated in triage.  Pt complains of anxiety.  Pt reports the interanl medicine clinic changed her medications.    Review of Systems  Positive:  Negative:   Physical Exam  BP 120/77 (BP Location: Left Arm)   Pulse (!) 134   Temp 98 F (36.7 C) (Oral)   Resp 20   SpO2 99%  Gen:   Awake, no distress   Resp:  Normal effort  MSK:   Moves extremities without difficulty  Other:    Medical Decision Making  Medically screening exam initiated at 2:08 PM.  Appropriate orders placed.  Ledora Delker was informed that the remainder of the evaluation will be completed by another provider, this initial triage assessment does not replace that evaluation, and the importance of remaining in the ED until their evaluation is complete.     Elson Areas, PA-C 02/28/22 1414    Elson Areas, [place] 02/28/22 1415

## 2022-02-28 NOTE — Telephone Encounter (Signed)
Pt states she is having anxiety attack.  Pt wanting to know how and when she should be taking the following medication.  Pt states she had a visit this morning with Dr. Monna Fam already but states she needs some help. Pt is begging to talk to someone.   busPIRone (BUSPAR) 5 MG tablet

## 2022-02-28 NOTE — Telephone Encounter (Signed)
Pt is requesting a call back .. she stated that she took the medication and her attack has slowed down after .Marland Kitchen but her  heart is still racing and if she can take another pill because it was a 5 mg tab .Marland KitchenMarland Kitchen

## 2022-02-28 NOTE — Assessment & Plan Note (Addendum)
Patient was seen here as a same-day visit for anxiety. The patient states that he started taking BuSpar over the weekend and stated that it helped her anxiety and help with decreasing panic attacks.  However, she was seen by behavioral health yesterday for anxiety, at which time she received trazodone.  However, this made her more anxious, and she represented to the ED for anxiety.  At that time, her work-up was unremarkable, and she was recommended to continue her BuSpar.  In the exam room, patient was anxious and sweaty, pacing the room during interview.  She states that the BuSpar has been helping with her anxiety, however she had 1 night that she was not able to sleep due to the BuSpar.  She is very hesitant to take certain medications due to feeling "out of control" when she takes any new medications. No SI/HI.  Plan: Discussed recommendation to take 0.5 mg Ativan once here in the clinic to help her calm down. However, patient ate some food in the room and reported feeling "100% better." States that she hasn't eaten in the past couple of days.  -Continue buspar as prescribed -Follow-up in 1 week for telehealth visit

## 2022-02-28 NOTE — Assessment & Plan Note (Signed)
Lab results obtained from last week are reflective of IDA. Pt states that she was on iron supplementation in the past but that she stopped taking them and tried to eat more red meat instead.   Plan: Discussed recommendation to start iron supplementation however the patient declined. She wants to make sure her anxiety is under control first. Will discuss this further at next telehealth visit.

## 2022-02-28 NOTE — Progress Notes (Signed)
Received Ativan 0.5 mg from inpatient pharmacy - 0.25 mg had been ordered. Pt stated she's feeling better after eating and refused medication; Dr Alroy Bailiff aware. Ativan 0.5 mg wasted; witnessed by Valentina Gu.

## 2022-03-01 ENCOUNTER — Encounter: Payer: Medicare Other | Admitting: Internal Medicine

## 2022-03-01 ENCOUNTER — Telehealth: Payer: Self-pay

## 2022-03-01 NOTE — Progress Notes (Signed)
Internal Medicine Clinic Attending ° °Case discussed with Dr. Bonanno  °  At the time of the visit.  We reviewed the resident’s history and exam and pertinent patient test results.  I agree with the assessment, diagnosis, and plan of care documented in the resident’s note. ° °

## 2022-03-01 NOTE — Telephone Encounter (Signed)
Requesting to speak with a nurse about medications. Please call pt back.  °

## 2022-03-01 NOTE — Telephone Encounter (Signed)
Call from pt - stated she's doing better. Pt's  asking what's the risk of decreasing Methadone; either now or later. Stated Methadone making the anxiety worse; this happened when it was increased from 35 mg to 38 mg.I asked if she had talked to someone at the methadone clinic; stated she wanted to ask her doctor first.Also she stated she takes Buspar early in the am first, hour later Duloxetine then Methadone ; then around 10Am she's "jumpy" - she wants to know why this is happening?

## 2022-03-03 ENCOUNTER — Telehealth: Payer: Self-pay

## 2022-03-03 NOTE — Progress Notes (Signed)
Internal Medicine Clinic Attending  Case discussed with Dr. Alroy Bailiff  At the time of the visit.  We reviewed the resident's history and exam and pertinent patient test results.  I agree with the assessment, diagnosis, and plan of care documented in the resident's note. Ms. Muise dramatic presentation was completely ameliorated when she ate food which she had brought; having declined assistance from our behavioral health provider and anxiolytic offered by Dr. Jiles Garter well into the visit, with whom she had not disclosed feeling hungry.  Low blood sugar may have contributed to her symptoms.  No additional care needed, except for advising her to maintain meal intake when she feels hungry.

## 2022-03-03 NOTE — Telephone Encounter (Signed)
Patient called she stated when she takes the Buspar during the day she is okay and doesn't have any problems with the medication and when she takes it at night the medication has her up and awake at night which is hard for her to relax and sleep she is requesting the MG to be increased up to 10MG  she stated her anxiety is up but not as bad. Patient stated the methadone is working and she is feeling much better and is less stressed and is in a good state of mind.

## 2022-03-07 ENCOUNTER — Ambulatory Visit (INDEPENDENT_AMBULATORY_CARE_PROVIDER_SITE_OTHER): Payer: Medicare Other | Admitting: Internal Medicine

## 2022-03-07 DIAGNOSIS — F411 Generalized anxiety disorder: Secondary | ICD-10-CM

## 2022-03-07 DIAGNOSIS — D500 Iron deficiency anemia secondary to blood loss (chronic): Secondary | ICD-10-CM

## 2022-03-07 DIAGNOSIS — Z0000 Encounter for general adult medical examination without abnormal findings: Secondary | ICD-10-CM

## 2022-03-07 MED ORDER — POLYETHYLENE GLYCOL 3350 17 G PO PACK: 17.0000 g | PACK | Freq: Every day | ORAL | 2 refills | Status: DC

## 2022-03-07 MED ORDER — BUSPIRONE HCL 5 MG PO TABS: 5.0000 mg | ORAL_TABLET | ORAL | 0 refills | Status: DC | PRN

## 2022-03-07 MED ORDER — SENNA 8.6 MG PO TABS: 2.0000 | ORAL_TABLET | Freq: Every day | ORAL | 0 refills | Status: DC

## 2022-03-07 MED ORDER — FERROUS SULFATE 325 (65 FE) MG PO TABS: 325.0000 mg | ORAL_TABLET | Freq: Every day | ORAL | 2 refills | Status: AC

## 2022-03-07 NOTE — Progress Notes (Signed)
  [facility] Internal Medicine Residency Telephone Encounter Continuity Care Appointment  HPI:  This telephone encounter was created for [name] on 03/07/2022 for the following purpose/cc: anxiety follow-up.    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    Assessment / Plan / Recommendations:  Please see A&P under problem oriented charting for assessment of the patient's acute and chronic medical conditions.  As always, pt is advised that if symptoms worsen or new symptoms arise, they should go to an urgent care facility or to to ER for further evaluation.   Consent and Medical Decision Making:  Patient discussed with Dr. Mayford Knife This is a telephone encounter between Paula Leon and Andrey Campanile on 03/07/2022 for: anxiety follow-up. The visit was conducted with the patient located at home and Andrey Campanile at [facility]. The patient's identity was confirmed using their DOB and current address. The patient has consented to being evaluated through a telephone encounter and understands the associated risks (an examination cannot be done and the patient may need to come in for an appointment) / benefits (allows the patient to remain at home, decreasing exposure to coronavirus). I personally spent 14 minutes on medical discussion.

## 2022-03-07 NOTE — Assessment & Plan Note (Addendum)
Patient is requesting a referral to a nutritionist as well as to a dentist  -Amb referrals to MNT and dentistry

## 2022-03-07 NOTE — Assessment & Plan Note (Addendum)
Patient is here today for a follow-up for her anxiety. She states that the buspar helps only when she is having panic attacks. However, when she is not having attacks and takes the buspar, she feels more anxious. Therefore, she has only been taking this medication as needed during panic attacks. Now, she feels that her anxiety is well-controlled with PRN buspar. She is now aware that the medication suppresses her appetite and has been trying to eat to keep her anxiety at bay. She was told by her methadone clinic that they can start weaning her off methadone when her anxiety is better controlled, which she does feel is the case now.  -Continue buspar PRN -Pt to discuss weaning off methadone with the methadone clinic

## 2022-03-07 NOTE — Assessment & Plan Note (Addendum)
During her last visit, pt stated that she wanted to get her anxiety under control before starting iron supplementation. Today, she says she is willing to start taking iron.  - Start daily ferrous sulfate 325 mg - Will prescribe senna and miralax for constipation

## 2022-03-08 ENCOUNTER — Emergency Department (HOSPITAL_COMMUNITY)
Admission: EM | Admit: 2022-03-08 | Discharge: 2022-03-09 | Disposition: A | Discharge status: Home / Self Care | Payer: Medicare Other | Attending: Emergency Medicine | Admitting: Emergency Medicine

## 2022-03-08 ENCOUNTER — Encounter (HOSPITAL_COMMUNITY): Payer: Self-pay

## 2022-03-08 ENCOUNTER — Other Ambulatory Visit: Payer: Self-pay

## 2022-03-08 DIAGNOSIS — F419 Anxiety disorder, unspecified: Secondary | ICD-10-CM | POA: Insufficient documentation

## 2022-03-08 NOTE — ED Triage Notes (Signed)
Pt not sleeping bc she has been having panic attacks. Pt has not slept in two days. Had medication to help her calm down and has been taking it and it helps but pt still wants to be seen.

## 2022-03-08 NOTE — ED Notes (Signed)
Pt up to desk stating that she is trying to relax to go to sleep, but that she cannot relax to go to sleep. Pt asked what she needed to be able to rest, pt not requesting anything at this time.

## 2022-03-08 NOTE — ED Provider Triage Note (Signed)
Emergency Medicine Provider Triage Evaluation Note  Paula Leon , a 56 y.o. female  was evaluated in triage.  Pt complains of anxiety. Reports she's not sleeping due to anxiety attacks. Has not slept in two days. Had medication that helps her calm down but says that she needs to "figure out what's wrong with her" because the medicine doesn't make her symptoms completely go away.   Has been seen numerous times for similar symptoms at this facility and others in the area.   Review of Systems  Positive: anxiety Negative: SI, HI, AVH  Physical Exam  BP (!) 150/94 (BP Location: Right Arm)   Pulse 98   Temp 98.4 F (36.9 C) (Oral)   Resp (!) 28   SpO2 99%  Gen:   Awake, no distress   Resp:  Normal effort  MSK:   Moves extremities without difficulty  Other:    Medical Decision Making  Medically screening exam initiated at 9:53 PM.  Appropriate orders placed.  [name] was informed that the remainder of the evaluation will be completed by another provider, this initial triage assessment does not replace that evaluation, and the importance of remaining in the ED until their evaluation is complete.  Upon chart review, patient was seen a few hours ago at [facility] ER for the following: "Increased anxiety x 2 months. Hx anxiety x 8 years. Increasing panic attacks. Last attack was this am. Requesting Methadone detox. She feels methadone is making her anxiety worse. Pt takes 35 mg/Day and gets it from [facility] in [place]. Pt took methadone this am."  Was noted to strip off her clothes and yell at staff, was evaluated by behavioral health team and discharged.    Su Monks, Cordelia Poche 03/08/22 2158

## 2022-03-09 ENCOUNTER — Telehealth: Payer: Self-pay | Admitting: *Deleted

## 2022-03-09 NOTE — ED Provider Notes (Signed)
[facility] EMERGENCY DEPARTMENT Provider Note   CSN: [id number] Arrival date & time: 03/08/22  2141     History  Chief Complaint  Patient presents with   not sleeping   Panic Attack    Paula Leon is a 56 y.o. female.  HPI  Medical history including anxiety, polysubstance dependency, presents with complaints of anxiety.  Patient states that she went to the behavioral health unit yesterday and they gave her Haldol, she states after having Haldol she felt anxious, so she came here for further evaluation.  She states she typically takes BuSpar to help with her anxiety but she did not want to take it because she had the Haldol so she has not had her normal dose of BuSpar.  She states that when she felt anxious yesterday she had some slight heart palpitations but denies having actual chest pain  shortness of breath become lightheaded dizziness nausea or vomiting.  Patient states since being here she fell asleep now feels fine she has no complaints, she states that she does feel anxious, she denies any suicidal homicidal ideations, she has no complaints at this time.    Home Medications Prior to Admission medications   Medication Sig Start Date End Date Taking? Authorizing Provider  busPIRone (BUSPAR) 5 MG tablet Take 1 tablet (5 mg total) by mouth as needed. As needed for panic attacks 03/07/22 06/05/22  Orvis Brill, MD  diclofenac Sodium (VOLTAREN) 1 % GEL Apply 4 g topically 4 (four) times daily. Patient taking differently: Apply 4 g topically 4 (four) times daily as needed (joint pain). 06/11/21   Atway, Rayann N, DO  DULoxetine (CYMBALTA) 30 MG capsule Take 1 capsule (30 mg total) by mouth daily. Patient not taking: Reported on 02/21/2022 02/02/22 05/03/22  Timothy Lasso, MD  ferrous sulfate 325 (65 FE) MG tablet Take 1 tablet (325 mg total) by mouth daily with breakfast. 03/07/22   Orvis Brill, MD  hydrOXYzine (ATARAX/VISTARIL) 10 MG tablet Take 2.5  tablets (25 mg total) by mouth every 8 (eight) hours as needed. Patient taking differently: Take 10 mg by mouth every 8 (eight) hours as needed for anxiety. 08/04/21   Lajean Manes, MD  methadone (DOLOPHINE) 10 MG/5ML solution Take 35 mg by mouth every morning.    [provider]  polyethylene glycol (MIRALAX) 17 g packet Take 17 g by mouth daily. 03/07/22   Orvis Brill, MD  senna (SENOKOT) 8.6 MG TABS tablet Take 2 tablets (17.2 mg total) by mouth daily. 03/07/22   Orvis Brill, MD  famotidine (PEPCID) 20 MG tablet Take 1 tablet (20 mg total) by mouth 2 (two) times daily. Patient not taking: Reported on 09/03/2019 07/28/19 03/10/20  Chase Picket, MD      Allergies    Penicillins    Review of Systems   Review of Systems  Constitutional:  Negative for chills and fever.  Respiratory:  Negative for shortness of breath.   Cardiovascular:  Negative for chest pain.  Gastrointestinal:  Negative for abdominal pain.  Neurological:  Negative for headaches.    Physical Exam Updated Vital Signs BP (!) 152/92 (BP Location: Right Arm)   Pulse (!) 107   Temp 98.4 F (36.9 C) (Oral)   Resp 18   Ht 5\' 4"  (1.626 m)   Wt 109.6 kg   SpO2 97%   BMI 41.47 kg/m  Physical Exam Vitals and nursing note reviewed.  Constitutional:      General: She is  not in acute distress.    Appearance: She is not ill-appearing.  HENT:     Head: Normocephalic and atraumatic.     Nose: No congestion.  Eyes:     Conjunctiva/sclera: Conjunctivae normal.  Cardiovascular:     Rate and Rhythm: Normal rate and regular rhythm.     Pulses: Normal pulses.     Heart sounds: No murmur heard.    No friction rub. No gallop.  Pulmonary:     Effort: No respiratory distress.     Breath sounds: No wheezing, rhonchi or rales.  Abdominal:     Palpations: Abdomen is soft.  Skin:    General: Skin is warm and dry.  Neurological:     Mental Status: She is alert.     Comments: No facial asymmetry no  difficulty with word finding following two-step commands no unilateral weakness present.  Psychiatric:        Mood and Affect: Mood normal.     Comments: Patient is alert and oriented x4, she is responding to all questions, does not appear to be responding to internal stimuli, denies suicidal homicidal ideations.     ED Results / Procedures / Treatments   Labs (all labs ordered are listed, but only abnormal results are displayed) Labs Reviewed - No data to display  EKG None  Radiology No results found.  Procedures Procedures    Medications Ordered in ED Medications - No data to display  ED Course/ Medical Decision Making/ A&P                           Medical Decision Making  This patient presents to the ED for concern of anxiety, this involves an extensive number of treatment options, and is a complaint that carries with it a high risk of complications and morbidity.  The differential diagnosis includes psychiatric emergency, metabolic derailments, ACS    Additional history obtained:  Additional history obtained from N/A External records from outside source obtained and reviewed including internal medicine notes, previous ED notes, psychiatry notes   Co morbidities that complicate the patient evaluation  Anxiety, polysubstance dependency  Social Determinants of Health:  N/A    Lab Tests:  I Ordered, and personally interpreted labs.  The pertinent results include: N/A   Imaging Studies ordered:  I ordered imaging studies including N/A I independently visualized and interpreted imaging which showed N/A I agree with the radiologist interpretation   Cardiac Monitoring:  The patient was maintained on a cardiac monitor.  I personally viewed and interpreted the cardiac monitored which showed an underlying rhythm of: N/A   Medicines ordered and prescription drug management:  I ordered medication including N/A I have reviewed the patients home medicines  and have made adjustments as needed  Critical Interventions:  N/A   Reevaluation:  Presents with anxiety which has since resolved, she has no complaints, patient states she is ready for discharge.  Consultations Obtained:  N/A    Test Considered:  We will defer on lab work at this time as patient had lab work performed yesterday, I personally reviewed them seen in [place], stable normocytic anemia hemoglobin 10.2, CMP is unremarkable, rapid urine drug unremarkable.    Rule out Low suspicion for psychiatric emergency does not endorse suicidal or homicidal ideations responding appropriately does not appear to be responding to internal stimuli.  I have low suspicion for ACS does not endorse chest pain or shortness of breath, presentation is  atypical etiology, more consistent with patient's established history of anxiety.  Low suspicion for metabolic derailments patient had lab work performed yesterday which was unremarkable.    Dispostion and problem list  After consideration of the diagnostic results and the patients response to treatment, I feel that the patent would benefit from discharge.  Anxiety-since resolved, we will have her continue with her home medications, follow-up with her PCP/behavioral health for further evaluation and strict return precautions.            Final Clinical Impression(s) / ED Diagnoses Final diagnoses:  None    Rx / DC Orders ED Discharge Orders     None         Marcello Fennel, PA-C 03/09/22 0532    Fatima Blank, MD 03/09/22 [phone]

## 2022-03-09 NOTE — Telephone Encounter (Signed)
Patient called in stating Buspar PRN is not working to control panic attacks. Requesting something to take on a daily basis to "stay in my system." States she did better on paroxetine. "When I got to 30 mg it made me flat but that was because of the methadone. I'm trying to wean off the methadone." Please advise.

## 2022-03-09 NOTE — ED Notes (Signed)
Called patient name for vitals and no response.. will try again in a few minutes.

## 2022-03-09 NOTE — Discharge Instructions (Addendum)
Please continue all your home medications.  Please follow with your PCP and/or the behavioral health unit for further evaluation  Come back to the emergency department if you develop chest pain, shortness of breath, severe abdominal pain, uncontrolled nausea, vomiting, diarrhea.

## 2022-03-10 MED ORDER — BUSPIRONE HCL 5 MG PO TABS: 5.0000 mg | ORAL_TABLET | Freq: Every day | ORAL | 2 refills | Status: DC

## 2022-03-10 NOTE — Telephone Encounter (Addendum)
Called patient to discuss anxiety and buspar regimen. States she has good days and bad days but that she feels better today. She states she has been taking buspar once a day rather than PRN and that it helps with her anxiety. States that taking buspar at night makes her anxious. She spoke with her methadone clinic and is starting to slowly wean off this medication. She is optimistic that weaning off methadone will help with her anxiety. Discussed that can continue AM buspar and advised that this can take a few weeks for her body to adjust to. Pt is in agreement with this plan and would like to follow-up for a telephone visit in about a month to see how buspar is working.

## 2022-03-11 ENCOUNTER — Other Ambulatory Visit: Payer: Self-pay | Admitting: Student

## 2022-03-14 ENCOUNTER — Other Ambulatory Visit: Payer: Self-pay | Admitting: Student

## 2022-03-14 DIAGNOSIS — F411 Generalized anxiety disorder: Secondary | ICD-10-CM

## 2022-03-16 ENCOUNTER — Telehealth (HOSPITAL_COMMUNITY): Payer: Self-pay | Admitting: Internal Medicine

## 2022-03-21 ENCOUNTER — Telehealth: Payer: Self-pay

## 2022-03-23 NOTE — Progress Notes (Signed)
Internal Medicine Clinic Attending  I reviewed the AWV findings.  I agree with the assessment, diagnosis, and plan of care documented in the AWV note.     

## 2022-03-28 ENCOUNTER — Encounter: Payer: Medicare Other | Admitting: Student

## 2022-03-28 DIAGNOSIS — R69 Illness, unspecified: Secondary | ICD-10-CM | POA: Diagnosis not present

## 2022-03-28 DIAGNOSIS — F1120 Opioid dependence, uncomplicated: Secondary | ICD-10-CM | POA: Diagnosis not present

## 2022-03-30 ENCOUNTER — Ambulatory Visit (HOSPITAL_COMMUNITY)
Admission: EM | Admit: 2022-03-30 | Discharge: 2022-03-30 | Disposition: A | Discharge status: Home / Self Care | Payer: Medicare Other

## 2022-03-30 NOTE — ED Notes (Signed)
Discharge instructions provided and Pt stated understanding. Pt alert, orient and ambulatory prior to d/c from facility. Personal belongings returned. Safety maintained.  

## 2022-04-01 ENCOUNTER — Ambulatory Visit (INDEPENDENT_AMBULATORY_CARE_PROVIDER_SITE_OTHER): Payer: Medicare Other

## 2022-04-01 ENCOUNTER — Encounter (HOSPITAL_COMMUNITY): Payer: Self-pay

## 2022-04-01 ENCOUNTER — Other Ambulatory Visit: Payer: Self-pay

## 2022-04-01 ENCOUNTER — Encounter (HOSPITAL_COMMUNITY): Payer: Self-pay | Admitting: Emergency Medicine

## 2022-04-01 ENCOUNTER — Emergency Department (HOSPITAL_COMMUNITY)
Admission: EM | Admit: 2022-04-01 | Discharge: 2022-04-02 | Discharge status: Against Medical Advice | Payer: Medicare HMO | Attending: Emergency Medicine | Admitting: Emergency Medicine

## 2022-04-01 ENCOUNTER — Ambulatory Visit (HOSPITAL_COMMUNITY)
Admission: EM | Admit: 2022-04-01 | Discharge: 2022-04-01 | Disposition: A | Discharge status: Home / Self Care | Payer: Medicare Other | Attending: Internal Medicine | Admitting: Internal Medicine

## 2022-04-01 DIAGNOSIS — K5900 Constipation, unspecified: Secondary | ICD-10-CM | POA: Insufficient documentation

## 2022-04-01 DIAGNOSIS — Z5321 Procedure and treatment not carried out due to patient leaving prior to being seen by health care provider: Secondary | ICD-10-CM | POA: Diagnosis not present

## 2022-04-01 DIAGNOSIS — F419 Anxiety disorder, unspecified: Secondary | ICD-10-CM | POA: Insufficient documentation

## 2022-04-01 DIAGNOSIS — F411 Generalized anxiety disorder: Secondary | ICD-10-CM

## 2022-04-01 DIAGNOSIS — K5903 Drug induced constipation: Secondary | ICD-10-CM | POA: Diagnosis not present

## 2022-04-01 DIAGNOSIS — R1010 Upper abdominal pain, unspecified: Secondary | ICD-10-CM | POA: Diagnosis not present

## 2022-04-01 DIAGNOSIS — F17210 Nicotine dependence, cigarettes, uncomplicated: Secondary | ICD-10-CM | POA: Insufficient documentation

## 2022-04-01 DIAGNOSIS — R109 Unspecified abdominal pain: Secondary | ICD-10-CM | POA: Diagnosis not present

## 2022-04-01 DIAGNOSIS — R69 Illness, unspecified: Secondary | ICD-10-CM | POA: Diagnosis not present

## 2022-04-01 LAB — COMPREHENSIVE METABOLIC PANEL
ALT: 12 U/L (ref 0–44)
AST: 15 U/L (ref 15–41)
Albumin: 3.6 g/dL (ref 3.5–5.0)
Alkaline Phosphatase: 75 U/L (ref 38–126)
Anion gap: 8 (ref 5–15)
BUN: 8 mg/dL (ref 6–20)
CO2: 26 mmol/L (ref 22–32)
Calcium: 9.1 mg/dL (ref 8.9–10.3)
Chloride: 103 mmol/L (ref 98–111)
Creatinine, Ser: 1.11 mg/dL — ABNORMAL HIGH (ref 0.44–1.00)
GFR, Estimated: 59 mL/min — ABNORMAL LOW (ref >60)
Glucose, Bld: 105 mg/dL — ABNORMAL HIGH (ref 70–99)
Potassium: 2.9 mmol/L — ABNORMAL LOW (ref 3.5–5.1)
Sodium: 137 mmol/L (ref 135–145)
Total Bilirubin: 0.5 mg/dL (ref 0.3–1.2)
Total Protein: 7.2 g/dL (ref 6.5–8.1)

## 2022-04-01 LAB — POCT URINALYSIS DIPSTICK, ED / UC
Bilirubin Urine: NEGATIVE
Glucose, UA: NEGATIVE mg/dL
Hgb urine dipstick: NEGATIVE
Ketones, ur: NEGATIVE mg/dL
Leukocytes,Ua: NEGATIVE
Nitrite: NEGATIVE
Protein, ur: NEGATIVE mg/dL
Specific Gravity, Urine: <=1.005 (ref 1.005–1.030)
Urobilinogen, UA: 0.2 mg/dL (ref 0.0–1.0)
pH: 5 (ref 5.0–8.0)

## 2022-04-01 LAB — CBC WITH DIFFERENTIAL/PLATELET
Abs Immature Granulocytes: 0.02 10*3/uL (ref 0.00–0.07)
Basophils Absolute: 0 10*3/uL (ref 0.0–0.1)
Basophils Relative: 0 %
Eosinophils Absolute: 0 10*3/uL (ref 0.0–0.5)
Eosinophils Relative: 1 %
HCT: 33.5 % — ABNORMAL LOW (ref 36.0–46.0)
Hemoglobin: 10.2 g/dL — ABNORMAL LOW (ref 12.0–15.0)
Immature Granulocytes: 0 %
Lymphocytes Relative: 22 %
Lymphs Abs: 1.1 10*3/uL (ref 0.7–4.0)
MCH: 21.9 pg — ABNORMAL LOW (ref 26.0–34.0)
MCHC: 30.4 g/dL (ref 30.0–36.0)
MCV: 72 fL — ABNORMAL LOW (ref 80.0–100.0)
Monocytes Absolute: 0.4 10*3/uL (ref 0.1–1.0)
Monocytes Relative: 9 %
Neutro Abs: 3.3 10*3/uL (ref 1.7–7.7)
Neutrophils Relative %: 68 %
Platelets: 228 10*3/uL (ref 150–400)
RBC: 4.65 MIL/uL (ref 3.87–5.11)
RDW: 18.6 % — ABNORMAL HIGH (ref 11.5–15.5)
WBC: 4.9 10*3/uL (ref 4.0–10.5)
nRBC: 0 % (ref 0.0–0.2)

## 2022-04-01 LAB — LIPASE, BLOOD: Lipase: 21 U/L (ref 11–51)

## 2022-04-01 MED ORDER — POLYETHYLENE GLYCOL 3350 17 GM/SCOOP PO POWD: 1.0000 | Freq: Once | ORAL | 0 refills | Status: AC

## 2022-04-01 MED ORDER — DOCUSATE SODIUM 100 MG PO CAPS: 100.0000 mg | ORAL_CAPSULE | Freq: Two times a day (BID) | ORAL | 0 refills | Status: DC

## 2022-04-01 MED ORDER — BISACODYL 10 MG RE SUPP: 10.0000 mg | RECTAL | 0 refills | Status: DC | PRN

## 2022-04-01 NOTE — ED Triage Notes (Signed)
Pt c/o constipation, reports no BM x 2 weeks. States she used a suppository today with no relief.

## 2022-04-01 NOTE — ED Triage Notes (Signed)
Onset 2-3 days pt reports heart racing and having trouble going to the bathroom. Pt reports her provider change her psych medication, and feels dehydrated.

## 2022-04-01 NOTE — Discharge Instructions (Signed)
Take 1 capful of MiraLAX twice daily mixed with 8 ounces of liquid until you are able to have a bowel movement.  Then, take MiraLAX 1 capful once daily for 5 days, then as needed.  Take Colace stool softener daily.  Drink plenty of water while taking MiraLAX.   Call [facility] to discuss your psychiatric medications further.  Do not abruptly stop your methadone.  Continue taking BuSpar daily.   If you are not able to have a bowel movement in the next 2 days, please return to urgent care.  If your abdominal pain becomes severe, please go to the emergency department.

## 2022-04-01 NOTE — ED Provider Notes (Incomplete)
[facility]    CSN: [id number] Arrival date & time: 04/01/22  1438      History   Chief Complaint Chief Complaint  Patient presents with  . Dehydration  . Panic Attack    HPI Paula Leon Date is a 56 y.o. female.   Patient presents to urgent care for evaluation of periumbilical abdominal pain and panic symptoms for the last 2 weeks.  Patient states that she believes that her methadone is causing her to have these panic symptoms and she wishes to completely come off of her methadone medication.  She used to take Paxil, but was tapered off of this over a period of 5 months starting in February 2023.  Patient is no longer taking Paxil and is instead taking 1/2 tablet of BuSpar every morning.  She states that she takes her 1/2 tablet of BuSpar and her methadone every morning, then has a panic attack.  This has been happening for the last month consistently.  She used to take 100 mg of methadone and is now taking 31 mg.  She has been titrated down by her psychiatrist ([facility]).  She last saw her psychiatrist this morning, but did not mention any of her panic symptoms or desire to completely come off of her methadone.  Initially, patient stated that she had not had a bowel movement in 2 weeks.  Upon further discussion and during physical exam, patient states that she has not had a bowel movement since yesterday, then states that it was 1 week ago.  She states that she has been drinking "a lot of water", but reports she continues to have dry mouth.  She also reports that she has been eating lots of vegetables at home and attempt to be able to have a bowel movement.  The last time she had a bowel movement, she states that it was very "hard and painful".  Denies blood in the stool.  Denies nausea, vomiting, fever/chills, sore throat, neck pain, back pain, and diarrhea.  Significant surgical history of umbilical hernia repair.  No other aggravating or relieving factors identified  at this time for patient's symptoms.    Past Medical History:  Diagnosis Date  . Anemia    IDA  . Depression   . Hernia of abdominal wall   . History of drug abuse (HCC)    has not used in ten years  . 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 Transfusion history    due to anemia with hb 4.0    Patient Active Problem List   Diagnosis Date Noted  . Palpitations 02/25/2022  . Constipation 10/29/2021  . Patellofemoral syndrome of left knee 06/11/2021  . Carpal tunnel syndrome on both sides 06/11/2021  . Chronic pain of both knees 10/23/2020  . Anemia   . Tobacco user 06/16/2020  . Heavy menstrual bleeding 06/16/2020  . Generalized anxiety disorder 06/16/2020  . Iron deficiency anemia due to chronic blood loss 06/16/2020  . Healthcare maintenance 06/16/2020  . Ventral hernia 04/14/2019  . Abnormal uterine bleeding 01/25/2018  . Morbid obesity (HCC) 03/27/2014  . Opioid dependence (HCC) 03/27/2014  . Panic attack 03/17/2011    Past Surgical History:  Procedure Laterality Date  . INSERTION OF MESH N/A 04/19/2019   Procedure: INSERTION OF MESH;  Surgeon: Darnell Level, MD;  Location: [facility];  Service: General;  Laterality: N/A;  . SALPINGECTOMY Left    due to ruptured ectopic  . VENTRAL HERNIA  REPAIR N/A 04/19/2019   Procedure: VENTRAL HERNIA REPAIR;  Surgeon: Darnell Level, MD;  Location:  SUR[facility]ERY [facility];  Service: General;  Laterality: N/A;    OB History     Gravida  10   Para  5   Term  5   Preterm  0   AB  5   Living  4      SAB  2   IAB  3   Ectopic  0   Multiple  0   Live Births  5            Home Medications    Prior to Admission medications   Medication Sig Start Date End Date Taking? Authorizing Provider  docusate sodium (COLACE) 100 MG capsule Take 1 capsule (100 mg total) by mouth every 12 (twelve) hours. 04/01/22  Yes Carlisle Beers, FNP  polyethylene glycol powder (MIRALAX) 17 GM/SCOOP powder  Take 255 g by mouth once for 1 dose. 04/01/22 04/01/22 Yes Lanore Renderos, Donavan Burnet, FNP  busPIRone (BUSPAR) 5 MG tablet Take 1 tablet (5 mg total) by mouth daily. As needed for panic attacks 03/10/22 06/08/22  Andrey Campanile, MD  diclofenac Sodium (VOLTAREN) 1 % GEL Apply 4 g topically 4 (four) times daily. Patient taking differently: Apply 4 g topically 4 (four) times daily as needed (joint pain). 06/11/21   Atway, Rayann N, DO  DULoxetine (CYMBALTA) 30 MG capsule Take 1 capsule (30 mg total) by mouth daily. Patient not taking: Reported on 02/21/2022 02/02/22 05/03/22  Dellis Filbert, MD  ferrous sulfate 325 (65 FE) MG tablet Take 1 tablet (325 mg total) by mouth daily with breakfast. 03/07/22   Andrey Campanile, MD  hydrOXYzine (ATARAX/VISTARIL) 10 MG tablet Take 2.5 tablets (25 mg total) by mouth every 8 (eight) hours as needed. Patient taking differently: Take 10 mg by mouth every 8 (eight) hours as needed for anxiety. 08/04/21   Carmel Sacramento, MD  methadone (DOLOPHINE) 10 MG/5ML solution Take 35 mg by mouth every morning.    [provider]  senna (SENOKOT) 8.6 MG TABS tablet Take 2 tablets (17.2 mg total) by mouth daily. 03/07/22   Andrey Campanile, MD  famotidine (PEPCID) 20 MG tablet Take 1 tablet (20 mg total) by mouth 2 (two) times daily. Patient not taking: Reported on 09/03/2019 07/28/19 03/10/20  Merrilee Jansky, MD    Family History Family History  Problem Relation Age of Onset  . Cancer Mother   . Cancer Father   . Colon cancer Neg Hx   . Colon polyps Neg Hx   . Esophageal cancer Neg Hx   . Rectal cancer Neg Hx   . Stomach cancer Neg Hx     Social History Social History   Tobacco Use  . Smoking status: Every Day    Packs/day: 0.25    Types: Cigarettes  . Smokeless tobacco: Never  . Tobacco comments:    4-5  cigarettes per day.   Wants to stop  Vaping Use  . Vaping Use: Never used  Substance Use Topics  . Alcohol use: Not Currently    Comment: 2012  . Drug  use: Not Currently    Types: Heroin, Marijuana    Comment: last time used drugs was 2012     Allergies   Penicillins   Review of Systems Review of Systems Per HPI  Physical Exam Triage Vital Signs ED Triage Vitals [04/01/22 1447]  Enc Vitals Group     BP (!) 110/38  Pulse Rate (!) 108     Resp 18     Temp 98.2 F (36.8 C)     Temp Source Oral     SpO2 100 %     Weight      Height      Head Circumference      Peak Flow      Pain Score      Pain Loc      Pain Edu?      Excl. in GC?    No data found.  Updated Vital Signs BP (!) 110/38 (BP Location: Left Arm)   Pulse (!) 108   Temp 98.2 F (36.8 C) (Oral)   Resp 18   SpO2 100%   Visual Acuity Right Eye Distance:   Left Eye Distance:   Bilateral Distance:    Right Eye Near:   Left Eye Near:    Bilateral Near:     Physical Exam Vitals and nursing note reviewed.  Constitutional:      Appearance: Normal appearance. She is not ill-appearing or toxic-appearing.     Comments: Very pleasant patient sitting on exam in position of comfort table in no acute distress.   HENT:     Head: Normocephalic and atraumatic.     Right Ear: Hearing and external ear normal.     Left Ear: Hearing and external ear normal.     Nose: Nose normal. No rhinorrhea.     Mouth/Throat:     Lips: Pink.     Mouth: Mucous membranes are moist.     Pharynx: No posterior oropharyngeal erythema.  Eyes:     General: Lids are normal. Vision grossly intact. Gaze aligned appropriately.        Right eye: No discharge.        Left eye: No discharge.     Extraocular Movements: Extraocular movements intact.     Conjunctiva/sclera: Conjunctivae normal.     Pupils: Pupils are equal, round, and reactive to light.  Cardiovascular:     Rate and Rhythm: Normal rate and regular rhythm.     Heart sounds: Normal heart sounds, S1 normal and S2 normal.  Pulmonary:     Effort: Pulmonary effort is normal. No respiratory distress.     Breath sounds:  Normal breath sounds and air entry.  Abdominal:     General: Abdomen is flat. Bowel sounds are normal.     Palpations: Abdomen is soft.     Tenderness: There is abdominal tenderness in the periumbilical area. There is no right CVA tenderness, left CVA tenderness or guarding.     Comments: No peritoneal signs elicited with physical exam of abdomen.  Musculoskeletal:     Cervical back: Neck supple.  Skin:    General: Skin is warm and dry.     Capillary Refill: Capillary refill takes less than 2 seconds.     Findings: No rash.  Neurological:     General: No focal deficit present.     Mental Status: She is alert and oriented to person, place, and time. Mental status is at baseline.     Cranial Nerves: No dysarthria or facial asymmetry.     Gait: Gait is intact.  Psychiatric:        Mood and Affect: Mood normal.        Speech: Speech normal.        Behavior: Behavior normal.        Thought Content: Thought content normal.  Judgment: Judgment normal.     UC Treatments / Results  Labs (all labs ordered are listed, but only abnormal results are displayed) Labs Reviewed  POCT URINALYSIS DIPSTICK, ED / UC    EKG   Radiology DG Abd 2 Views  Result Date: 04/01/2022 CLINICAL DATA:  A 56 year old female presents for a value UA shin of constipation, last bowel movement 1 week ago with upper abdominal pain. EXAM: ABDOMEN - 2 VIEW COMPARISON:  Imaging from Feb 23, 2019. FINDINGS: There is scattered stool and gas throughout the:. Gas in the area of the rectum. There is no sign of small bowel obstruction. No free air beneath either the RIGHT or LEFT hemidiaphragm on upright radiograph in no air-fluid levels. No abnormal calcifications. On limited assessment there is no acute skeletal finding. Soft tissues are grossly unremarkable. IMPRESSION: Mild fecal burden, no signs of obstruction or free air. Electronically Signed   By: Donzetta Kohut M.D.   On: 04/01/2022 16:18     Procedures Procedures (including critical care time)  Medications Ordered in UC Medications - No data to display  Initial Impression / Assessment and Plan / UC Course  I have reviewed the triage vital signs and the nursing notes.  Pertinent labs & imaging results that were available during my care of the patient were reviewed by me and considered in my medical decision making (see chart for details).  1.  Drug-induced constipation   *** Final Clinical Impressions(s) / UC Diagnoses   Final diagnoses:  Drug-induced constipation  Anxiety state     Discharge Instructions      Take 1 capful of MiraLAX twice daily mixed with 8 ounces of liquid until you are able to have a bowel movement.  Then, take MiraLAX 1 capful once daily for 5 days, then as needed.  Take Colace stool softener daily.  Drink plenty of water while taking MiraLAX.   Call [facility] to discuss your psychiatric medications further.  Do not abruptly stop your methadone.  Continue taking BuSpar daily.   If you are not able to have a bowel movement in the next 2 days, please return to urgent care.  If your abdominal pain becomes severe, please go to the emergency department.      ED Prescriptions     Medication Sig Dispense Auth. Provider   docusate sodium (COLACE) 100 MG capsule Take 1 capsule (100 mg total) by mouth every 12 (twelve) hours. 60 capsule Carlisle Beers, FNP   polyethylene glycol powder (MIRALAX) 17 GM/SCOOP powder Take 255 g by mouth once for 1 dose. 255 g Carlisle Beers, FNP      PDMP not reviewed this encounter.

## 2022-04-01 NOTE — ED Provider Triage Note (Signed)
Emergency Medicine Provider Triage Evaluation Note  Paula Leon , a 56 y.o. female  was evaluated in triage.  Pt complains of constipation and abdominal pain.  Patient states that she was sent over from an urgent care for this.  She had an abdominal x-ray.  I reviewed these images that showed large stool burden, no evidence of fecal impaction.  Patient states that she does feel like she has stool stuck in her rectum.  She was discharged from the urgent care and unfinished note states that she had drug-induced constipation start MiraLAX.  Patient states that she was sent here for further evaluation.  She has tried multiple remedies at home without results.  Review of Systems  Positive: constipation Negative: fever  Physical Exam  BP 123/89 (BP Location: Right Arm)   Pulse 100   Temp 98.3 F (36.8 C) (Oral)   Resp 18   SpO2 96%  Gen:   Awake, no distress   Resp:  Normal effort  MSK:   Moves extremities without difficulty  Other:  No sig tenderness  Medical Decision Making  Medically screening exam initiated at 9:19 PM.  Appropriate orders placed.  Paula Leon was informed that the remainder of the evaluation will be completed by another provider, this initial triage assessment does not replace that evaluation, and the importance of remaining in the ED until their evaluation is complete.  Work up initiated.   Arthor Captain, PA-C 04/01/22 2121

## 2022-04-02 ENCOUNTER — Emergency Department (HOSPITAL_COMMUNITY)
Admission: EM | Admit: 2022-04-02 | Discharge: 2022-04-02 | Disposition: A | Discharge status: Home / Self Care | Payer: Medicare HMO | Source: Home / Self Care | Attending: Student | Admitting: Student

## 2022-04-02 DIAGNOSIS — R69 Illness, unspecified: Secondary | ICD-10-CM | POA: Diagnosis not present

## 2022-04-02 DIAGNOSIS — F419 Anxiety disorder, unspecified: Secondary | ICD-10-CM | POA: Diagnosis not present

## 2022-04-02 DIAGNOSIS — F17210 Nicotine dependence, cigarettes, uncomplicated: Secondary | ICD-10-CM | POA: Insufficient documentation

## 2022-04-02 DIAGNOSIS — K5900 Constipation, unspecified: Secondary | ICD-10-CM | POA: Diagnosis not present

## 2022-04-02 LAB — CBC WITH DIFFERENTIAL/PLATELET
Abs Immature Granulocytes: 0.02 10*3/uL (ref 0.00–0.07)
Basophils Absolute: 0 10*3/uL (ref 0.0–0.1)
Basophils Relative: 1 %
Eosinophils Absolute: 0 10*3/uL (ref 0.0–0.5)
Eosinophils Relative: 1 %
HCT: 35.5 % — ABNORMAL LOW (ref 36.0–46.0)
Hemoglobin: 10.8 g/dL — ABNORMAL LOW (ref 12.0–15.0)
Immature Granulocytes: 0 %
Lymphocytes Relative: 16 %
Lymphs Abs: 0.9 10*3/uL (ref 0.7–4.0)
MCH: 21.9 pg — ABNORMAL LOW (ref 26.0–34.0)
MCHC: 30.4 g/dL (ref 30.0–36.0)
MCV: 71.9 fL — ABNORMAL LOW (ref 80.0–100.0)
Monocytes Absolute: 0.4 10*3/uL (ref 0.1–1.0)
Monocytes Relative: 7 %
Neutro Abs: 4.4 10*3/uL (ref 1.7–7.7)
Neutrophils Relative %: 75 %
Platelets: 243 10*3/uL (ref 150–400)
RBC: 4.94 MIL/uL (ref 3.87–5.11)
RDW: 18.6 % — ABNORMAL HIGH (ref 11.5–15.5)
WBC: 5.8 10*3/uL (ref 4.0–10.5)
nRBC: 0 % (ref 0.0–0.2)

## 2022-04-02 LAB — COMPREHENSIVE METABOLIC PANEL
ALT: 14 U/L (ref 0–44)
AST: 15 U/L (ref 15–41)
Albumin: 3.8 g/dL (ref 3.5–5.0)
Alkaline Phosphatase: 88 U/L (ref 38–126)
Anion gap: 12 (ref 5–15)
BUN: 8 mg/dL (ref 6–20)
CO2: 28 mmol/L (ref 22–32)
Calcium: 9.3 mg/dL (ref 8.9–10.3)
Chloride: 99 mmol/L (ref 98–111)
Creatinine, Ser: 1.01 mg/dL — ABNORMAL HIGH (ref 0.44–1.00)
GFR, Estimated: >60 mL/min (ref >60)
Glucose, Bld: 122 mg/dL — ABNORMAL HIGH (ref 70–99)
Potassium: 3.1 mmol/L — ABNORMAL LOW (ref 3.5–5.1)
Sodium: 139 mmol/L (ref 135–145)
Total Bilirubin: 0.4 mg/dL (ref 0.3–1.2)
Total Protein: 7.9 g/dL (ref 6.5–8.1)

## 2022-04-02 LAB — LIPASE, BLOOD: Lipase: 22 U/L (ref 11–51)

## 2022-04-02 MED ORDER — HYDROXYZINE HCL 25 MG PO TABS: 25.0000 mg | ORAL_TABLET | Freq: Three times a day (TID) | ORAL | 0 refills | Status: DC | PRN

## 2022-04-02 MED ORDER — HYDROXYZINE HCL 25 MG PO TABS
50.0000 mg | ORAL_TABLET | Freq: Once | ORAL | Status: AC
  Administered 2022-04-02: 25 mg via ORAL
  Filled 2022-04-02: qty 2

## 2022-04-02 NOTE — ED Triage Notes (Signed)
Pt reports having panic attacks every day at 1000. Tapering off of Methadone and reports symptoms are worse each time the dose is decreased. Also reports constant palpitations since coming off of paroxetine.

## 2022-04-02 NOTE — ED Provider Notes (Signed)
[facility] Provider Note  CSN: [id number] Arrival date & time: 04/02/22 1444  Chief Complaint(s) No chief complaint on file.  HPI Paula Leon is a 56 y.o. female with PMH anxiety, previous history of opioid abuse currently on methadone and actively weaning down who presents to the emergency department for evaluation of anxiety and panic attacks.  Patient has been tapered off of her Paxil and states that over the last few months she has had recurrent panic attacks.  She has been seen in the outpatient setting, as well as the ER multiple times for the same complaint.  She states that she is currently taking no medication for her anxiety other than BuSpar which she is only taking half.  She denies chest pain, shortness of breath, abdominal pain, nausea, vomiting or other systemic symptoms.   Past Medical History Past Medical History:  Diagnosis Date   Anemia    IDA   Depression    Hernia of abdominal wall    History of drug abuse ([place])    has not used in ten years   Substance abuse ([place])    2012 QUIT Heroin   Transfusion history    due to anemia with hb 4.0   Patient Active Problem List   Diagnosis Date Noted   Palpitations 02/25/2022   Constipation 10/29/2021   Patellofemoral syndrome of left knee 06/11/2021   Carpal tunnel syndrome on both sides 06/11/2021   Chronic pain of both knees 10/23/2020   Anemia    Tobacco user 06/16/2020   Heavy menstrual bleeding 06/16/2020   Generalized anxiety disorder 06/16/2020   Iron deficiency anemia due to chronic blood loss 06/16/2020   Healthcare maintenance 06/16/2020   Ventral hernia 04/14/2019   Abnormal uterine bleeding 01/25/2018   Morbid obesity ([place]) 03/27/2014   Opioid dependence ([place]) 03/27/2014   Panic attack 03/17/2011   Home Medication(s) Prior to Admission medications   Medication Sig Start Date End Date Taking? Authorizing Provider  hydrOXYzine (ATARAX) 25 MG tablet Take 1 tablet  (25 mg total) by mouth every 8 (eight) hours as needed. 04/02/22  Yes Kayce Betty, MD  bisacodyl (DULCOLAX) 10 MG suppository Place 1 suppository (10 mg total) rectally as needed for moderate constipation. 04/01/22   Talbot Grumbling, FNP  busPIRone (BUSPAR) 5 MG tablet Take 1 tablet (5 mg total) by mouth daily. As needed for panic attacks 03/10/22 06/08/22  Orvis Brill, MD  diclofenac Sodium (VOLTAREN) 1 % GEL Apply 4 g topically 4 (four) times daily. Patient taking differently: Apply 4 g topically 4 (four) times daily as needed (joint pain). 06/11/21   Atway, Rayann N, DO  docusate sodium (COLACE) 100 MG capsule Take 1 capsule (100 mg total) by mouth every 12 (twelve) hours. 04/01/22   Talbot Grumbling, FNP  DULoxetine (CYMBALTA) 30 MG capsule Take 1 capsule (30 mg total) by mouth daily. Patient not taking: Reported on 02/21/2022 02/02/22 05/03/22  Timothy Lasso, MD  ferrous sulfate 325 (65 FE) MG tablet Take 1 tablet (325 mg total) by mouth daily with breakfast. 03/07/22   Orvis Brill, MD  methadone (DOLOPHINE) 10 MG/5ML solution Take 35 mg by mouth every morning.    [provider]  senna (SENOKOT) 8.6 MG TABS tablet Take 2 tablets (17.2 mg total) by mouth daily. 03/07/22   Orvis Brill, MD  famotidine (PEPCID) 20 MG tablet Take 1 tablet (20 mg total) by mouth 2 (two) times daily. Patient not taking: Reported on  09/03/2019 07/28/19 03/10/20  Merrilee Jansky, MD                                                                                                                                    Past Surgical History Past Surgical History:  Procedure Laterality Date   INSERTION OF MESH N/A 04/19/2019   Procedure: INSERTION OF MESH;  Surgeon: Darnell Level, MD;  Location: [facility];  Service: General;  Laterality: N/A;   SALPINGECTOMY Left    due to ruptured ectopic   VENTRAL HERNIA REPAIR N/A 04/19/2019   Procedure: VENTRAL HERNIA REPAIR;  Surgeon:  Darnell Level, MD;  Location: [facility];  Service: General;  Laterality: N/A;   Family History Family History  Problem Relation Age of Onset   Cancer Mother    Cancer Father    Colon cancer Neg Hx    Colon polyps Neg Hx    Esophageal cancer Neg Hx    Rectal cancer Neg Hx    Stomach cancer Neg Hx     Social History Social History   Tobacco Use   Smoking status: Every Day    Packs/day: 0.25    Types: Cigarettes   Smokeless tobacco: Never   Tobacco comments:    4-5  cigarettes per day.   Wants to stop  Vaping Use   Vaping Use: Never used  Substance Use Topics   Alcohol use: Not Currently    Comment: 2012   Drug use: Not Currently    Types: Heroin, Marijuana    Comment: last time used drugs was 2012   Allergies Penicillins  Review of Systems Review of Systems  Psychiatric/Behavioral:  The patient is nervous/anxious.     Physical Exam Vital Signs  I have reviewed the triage vital signs BP (!) 140/91   Pulse 87   Temp 98.2 F (36.8 C)   Resp 15   SpO2 100%   Physical Exam Vitals and nursing note reviewed.  Constitutional:      General: She is not in acute distress.    Appearance: She is well-developed.  HENT:     Head: Normocephalic and atraumatic.  Eyes:     Conjunctiva/sclera: Conjunctivae normal.  Cardiovascular:     Rate and Rhythm: Normal rate and regular rhythm.     Heart sounds: No murmur heard. Pulmonary:     Effort: Pulmonary effort is normal. No respiratory distress.     Breath sounds: Normal breath sounds.  Abdominal:     Palpations: Abdomen is soft.     Tenderness: There is no abdominal tenderness.  Musculoskeletal:        General: No swelling.     Cervical back: Neck supple.  Skin:    General: Skin is warm and dry.     Capillary Refill: Capillary refill takes less than 2 seconds.  Neurological:     Mental Status: She is alert.  Psychiatric:  Comments: Anxious appearing     ED Results and Treatments Labs (all  labs ordered are listed, but only abnormal results are displayed) Labs Reviewed  CBC WITH DIFFERENTIAL/PLATELET - Abnormal; Notable for the following components:      Result Value   Hemoglobin 10.8 (*)    HCT 35.5 (*)    MCV 71.9 (*)    MCH 21.9 (*)    RDW 18.6 (*)    All other components within normal limits  COMPREHENSIVE METABOLIC PANEL - Abnormal; Notable for the following components:   Potassium 3.1 (*)    Glucose, Bld 122 (*)    Creatinine, Ser 1.01 (*)    All other components within normal limits  LIPASE, BLOOD                                                                                                                          Radiology No results found.  Pertinent labs & imaging results that were available during my care of the patient were reviewed by me and considered in my medical decision making (see MDM for details).  Medications Ordered in ED Medications  hydrOXYzine (ATARAX) tablet 50 mg (25 mg Oral Given 04/02/22 1924)                                                                                                                                     Procedures Procedures  (including critical care time)  Medical Decision Making / ED Course   This patient presents to the ED for concern of anxiety, panic attack, this involves an extensive number of treatment options, and is a complaint that carries with it a high risk of complications and morbidity.  The differential diagnosis includes anxiety, panic disorder, methadone withdrawal  MDM: Patient seen emergency room for evaluation of anxiety.  Physical exam reveals an anxious appearing patient but is otherwise unremarkable.  Laboratory evaluation with hemoglobin of 10.8 which is chronic for this patient and a potassium of 3.1.  Patient encouraged to increase potassium rich foods.  No PVCs or arrhythmias on the monitor seen.  Patient given Atarax and her anxiety and panic sensation completely resolved.  It looks like  the patient was previously prescribed this medication but she currently is no longer on this.  Thus I refilled her Atarax prescription and patient was discharged with [facility] follow-up.   Additional history obtained:  -External  records from outside source obtained and reviewed including: Chart review including previous notes, labs, imaging, consultation notes   Lab Tests: -I ordered, reviewed, and interpreted labs.   The pertinent results include:   Labs Reviewed  CBC WITH DIFFERENTIAL/PLATELET - Abnormal; Notable for the following components:      Result Value   Hemoglobin 10.8 (*)    HCT 35.5 (*)    MCV 71.9 (*)    MCH 21.9 (*)    RDW 18.6 (*)    All other components within normal limits  COMPREHENSIVE METABOLIC PANEL - Abnormal; Notable for the following components:   Potassium 3.1 (*)    Glucose, Bld 122 (*)    Creatinine, Ser 1.01 (*)    All other components within normal limits  LIPASE, BLOOD    Medicines ordered and prescription drug management: Meds ordered this encounter  Medications   hydrOXYzine (ATARAX) tablet 50 mg   hydrOXYzine (ATARAX) 25 MG tablet    Sig: Take 1 tablet (25 mg total) by mouth every 8 (eight) hours as needed.    Dispense:  60 tablet    Refill:  0    -I have reviewed the patients home medicines and have made adjustments as needed  Critical interventions none  Cardiac Monitoring: The patient was maintained on a cardiac monitor.  I personally viewed and interpreted the cardiac monitored which showed an underlying rhythm of: NSR  Social Determinants of Health:  Factors impacting patients care include: Currently weaning off of methadone   Reevaluation: After the interventions noted above, I reevaluated the patient and found that they have :improved  Co morbidities that complicate the patient evaluation  Past Medical History:  Diagnosis Date   Anemia    IDA   Depression    Hernia of abdominal wall    History of drug abuse (HCC)    has  not used in ten years   Substance abuse (HCC)    2012 QUIT Heroin   Transfusion history    due to anemia with hb 4.0      Dispostion: I considered admission for this patient, but she does not meet admission criteria and is safe for discharge with outpatient psychiatric follow-up     Final Clinical Impression(s) / ED Diagnoses Final diagnoses:  Anxiety     @PCDICTATION @    , MD 04/02/22 2054

## 2022-04-02 NOTE — ED Notes (Signed)
X2 for vitals recheck with no response °

## 2022-04-02 NOTE — ED Provider Triage Note (Signed)
Emergency Medicine Provider Triage Evaluation Note  Paula Leon , a 56 y.o. female  was evaluated in triage.  Pt complains of patient seen last night for constipation, returns today with complaint of anxiety after she has stopped paroxetine.  Patient also had her methadone reduced today.  She is complaining of abdominal pain, racing heart, sweating.  She is also having palpitations.  She feels anxious.  Review of Systems  Positive: Racing heart Negative: Fever  Physical Exam  BP (!) 133/93 (BP Location: Right Arm)   Pulse (!) 127   Temp 98.3 F (36.8 C) (Oral)   Resp 18   SpO2 100%  Gen:   Awake, no distress   Resp:  Normal effort  MSK:   Moves extremities without difficulty  Other:  Anxious  Medical Decision Making  Medically screening exam initiated at 3:17 PM.  Appropriate orders placed.  Paula Leon was informed that the remainder of the evaluation will be completed by another provider, this initial triage assessment does not replace that evaluation, and the importance of remaining in the ED until their evaluation is complete.  Work-up initiated   Paula Captain, PA-C 04/02/22 1520

## 2022-04-04 ENCOUNTER — Telehealth: Payer: Self-pay | Admitting: *Deleted

## 2022-04-04 ENCOUNTER — Ambulatory Visit (INDEPENDENT_AMBULATORY_CARE_PROVIDER_SITE_OTHER): Payer: Medicare HMO | Admitting: Student

## 2022-04-04 DIAGNOSIS — F1121 Opioid dependence, in remission: Secondary | ICD-10-CM

## 2022-04-04 DIAGNOSIS — R69 Illness, unspecified: Secondary | ICD-10-CM | POA: Diagnosis not present

## 2022-04-04 DIAGNOSIS — F411 Generalized anxiety disorder: Secondary | ICD-10-CM | POA: Diagnosis not present

## 2022-04-04 DIAGNOSIS — F1120 Opioid dependence, uncomplicated: Secondary | ICD-10-CM | POA: Diagnosis not present

## 2022-04-04 NOTE — Telephone Encounter (Signed)
Patient called in stating she is having an anxiety attack and cannot wait "hours and hours for doctor to call me." She was given tele appt today at 1545 with PCP, and transferred to Triage. She did not state reason for increased anxiety. She was given contact info for [employer], and [employer]. States she already has Lexicographer for [facility] on [address] She would still like call back from PCP.

## 2022-04-04 NOTE — Assessment & Plan Note (Signed)
Assessment: History of GAD. Was on paxil for many years doing well, however, over the past year this was titrated off per the patient's request. She was trialed on cymbalta, buspar PRN, and more recently atarax PRN. She is no longer on an SSRI/SNRI for her anxiety. She has also been self titrating her methadone. Prior documentation notes patient feeling as though none of these medications are working and they are all interacting with her methadone. Possible she has other underlying psychiatric condition contributing to medication non-adherence.   Unclear what occurred that changed, but believe she needs further evaluation by psychatiry to help with her GAD and substance use disorder history. Discussed with her follow up [facility] tomorrow.   Plan: -continue atarax PRN -follow [facility] -consider restarting paxil

## 2022-04-04 NOTE — Progress Notes (Signed)
   CC: generalized anxiety disorder  This is a telephone encounter between Paula Leon and Paula Leon on 04/04/2022 for generalized anxiety disorder. The visit was conducted with the patient located at home and Paula Leon at [facility]. The patient's identity was confirmed using their DOB and current address. The patient has consented to being evaluated through a telephone encounter and understands the associated risks (an examination cannot be done and the patient may need to come in for an appointment) / benefits (allows the patient to remain at home, decreasing exposure to coronavirus). I personally spent 15 minutes on medical discussion.   HPI:  [name] is a 56 y.o. with PMH as below.   Please see A&P for assessment of the patient's acute and chronic medical conditions.   Past Medical History:  Diagnosis Date   Anemia    IDA   Depression    Hernia of abdominal wall    History of drug abuse (HCC)    has not used in ten years   Substance abuse (HCC)    2012 QUIT Heroin   Transfusion history    due to anemia with hb 4.0   Review of Systems:   Positive for anxiety   Assessment & Plan:   Opioid dependence (HCC) Assessment: Patient following with methadone clinic and has been on 35 mg every morning. She notes recently that she started to self titrate herself off of the medication, currently taking 28 mg daily. I believe this self titration is contributing to her anxiety. Recommended she continue current dose and call her methadone clinic tomorrow.   Plan: -Follow up with her methadone clinic -Recommend to stop self taper medicine and discuss with methadone prescribers  Generalized anxiety disorder Assessment: History of GAD. Was on paxil for many years doing well, however, over the past year this was titrated off per the patient's request. She was trialed on cymbalta, buspar PRN, and more recently atarax PRN. She is no longer on an SSRI/SNRI for her anxiety. She has  also been self titrating her methadone. Prior documentation notes patient feeling as though none of these medications are working and they are all interacting with her methadone. Possible she has other underlying psychiatric condition contributing to medication non-adherence.   Unclear what occurred that changed, but believe she needs further evaluation by psychatiry to help with her GAD and substance use disorder history. Discussed with her follow up with [facility] tomorrow.   Plan: -continue atarax PRN -[facility] -consider restarting paxil   Patient discussed with Dr. Lorriane Shire Internal Medicine Resident

## 2022-04-04 NOTE — Assessment & Plan Note (Signed)
Assessment: Patient following with methadone clinic and has been on 35 mg every morning. She notes recently that she started to self titrate herself off of the medication, currently taking 28 mg daily. I believe this self titration is contributing to her anxiety. Recommended she continue current dose and call her methadone clinic tomorrow.   Plan: -Follow up with her methadone clinic -Recommend to stop self taper medicine and discuss with methadone prescribers

## 2022-04-05 ENCOUNTER — Ambulatory Visit (HOSPITAL_COMMUNITY)
Admission: EM | Admit: 2022-04-05 | Discharge: 2022-04-05 | Disposition: A | Discharge status: Home / Self Care | Payer: Medicaid Other | Attending: Behavioral Health | Admitting: Behavioral Health

## 2022-04-05 DIAGNOSIS — F410 Panic disorder [episodic paroxysmal anxiety] without agoraphobia: Secondary | ICD-10-CM | POA: Insufficient documentation

## 2022-04-05 NOTE — Progress Notes (Signed)
   04/05/22 1034  [facility] Triage Screening (Walk-ins at [facility] only)  How Did You Hear About [place]? Self  What Is the Reason for Your Visit/Call Today? 56 year old Paula Leon presents to the [facility] with compliants of anxiety. She reports for the past 46-months she has been having panic attacks between the hours of 9:45am-12pm. Report her doctor has been detoxing her off her medication from March - May going down 5mg  per month off Paroxetine. Report May 2023 was put on Buspur for sleep. Report she has been going to the ER every other day due to her anxiety symptoms. Report she was given Trazodone last time she was here and feels that her body had an allergic reaction because she's been having worsening symptoms of anxiety. Patient denied suicidal/homicidal ideations and denied auditory/visual hallucinations. Patient states, "I just want my body to calm down." Report she has been taking methadone for 11 years and feels that it's not the methadone but then stated after she takes her Methadone 1-hour later she's having panic attacks.  How Long Has This Been Causing You Problems? 1-6 months  Have You Recently Had Any Thoughts About Hurting Yourself? No  Are You Planning to Commit Suicide/Harm Yourself At This time? No  Have you Recently Had Thoughts About Hurting Someone June 2023? No  Are You Planning To Harm Someone At This Time? No  Are you currently experiencing any auditory, visual or other hallucinations? No  Have You Used Any Alcohol or Drugs in the Past 24 Hours? No  Do you have any current medical co-morbidities that require immediate attention? No  Clinician description of patient physical appearance/behavior: Patient is anxious yet calm and cooparative.  What Do You Feel Would Help You the Most Today? Medication(s)  If access to [facility] was not available, would you have sought care in the Emergency Department? Yes  Determination of Need Routine (7 days)  Options For Referral Medication Management

## 2022-04-05 NOTE — Discharge Instructions (Addendum)
Good morning!  It is imperative that you follow through with treatment recommendations within 7-10 days from the day of discharge to maintain stabilization and mitigate further risk to your safety and overall mental well-being.  Based on what you have shared with [place], a list of providers for psychiatry, therapy, and substance use treatment has been provided below to get you started.  In case of an urgent emergency, you have the option of contacting the Mobile Crisis Unit with [facility] at [phone].  When coming off of Methadone, it is very important to taper at a pace that is comfortable for you and is medically monitored by a provider who provides this service.  This list includes providers who have opioid replacement therapies in case you no longer want to do it through [facility].  It is easy to become reliant on ORT over the years that the thought of coming off of it can create anxiety because of the psychological dependency on it.  It is best practice to be linked with a therapist who can teach you the coping skills to bring the level of anxiety down to where you are able to manage it with what skills and grounding techniques you have learned.  Mindfulness practices, meditation, exercise, restorative sleep, and yoga are extremely helpful.  You also have the option to call 211 or go to www.nc211.org for additional resources.  Outpatient Therapy and Psychiatry for Medicare Recipients  [facility] [address] [place], [place], [address] [phone] phone  [facility] [address], [address] [place], [place], [address] [phone] phone  [facility] [address], [address] [place], [place], [address] [phone] phone  [facility] [address] [place], [place], [address] [phone] phone [phone] fax  [facility], [place] [address], [place],  [address] [phone] phone  [facility] [address] [place], [place], [address] [phone] phone [phone] fax  [facility] [address] [place], [place], [address] [phone] phone  Jovita Kussmaul [address] [place], [place], [address] [phone] phone  [facility] [address] [place], [place], [address] [phone] phone [phone] fax    SUBSTANCE USE TREATMENT  [facility] [address] [place], [place] [address] [phone]  ((CD-IOP is currently not operational; Opioid replacement clinic is operational))    [facility] (private insurance) [address] [place] [address] [phone]  ((CD-IOP (afternoon program); individual therapy))   [facility] (Medicaid for [employer] and [employer]; some private insurance) [address] [place], [place] [address] [phone]  ((CD-IOP))   [facility] & private insurance) [phone] [place], [place] [address] [phone]  ((CD-IOP (morning & evening programs); individual therapy))   OPIOID REPLACEMENT PROVIDERS:  [facility]) [address], [place] [address] [phone]  [facility] [address] [place], [place] [address] [phone]  [facility] [address] [place], [place] [address] [phone]   12 STEP PROGRAMS:  Alcoholics Anonymous of [place] [employer].be  Narcotics Anonymous of [place] [employer]dk  Al-Anon of  [place], [place] www.[place]alan[place].org/find-meetings.html  Nar-Anon https://nar-anon.org/find-a-meetin

## 2022-04-05 NOTE — ED Provider Notes (Signed)
Behavioral Health Urgent Care Medical Screening Exam  Patient Name: Paula Leon MRN: [id number] Date of Evaluation: 04/05/22 Diagnosis:  Final diagnoses:  Panic attacks    History of Present illness: Paula Leon is a 56 y.o. female patient who presents to the [facility] behavioral health urgent care voluntary as a walk-in with a chief complaint of increased anxiety and panic attacks.  On evaluation, patient is alert and oriented x 4. Her thought process is logical and linear. Her speech is clear and coherent. Her mood is anxious and affect is congruent. .   Patient states that she has been experiencing anxiety and panic attacks since she was taken off of the paroxetine by her PCP at [facility] due to the side effects of depression, sleeping too much, and fogginess. She states that she is not a depressed person. She states that she was weaned off of the paroxetine in May and was put on BuSpar 0.5 mg twice daily for anxiety. She states that when she started the BuSpar she could not sleep at night. She states that she came here to the [facility] at the end of May because the BuSpar was causing her not to sleep. She states that at that time she was prescribed trazodone which triggered her anxiety and panic attacks. She states that she only took the trazodone once. She then states that she experiences anxiety and panic attacks after taking her daily dose of Methadone 29 mg. She describes her anxiety as palpitations, feeling anxious, worrying and dry mouth at least 4-5 times per day. She denies current stressors or triggers attributing her increase in anxiety or frequent panic attacks. She states that a few weeks ago she went to the ED due to anxiety and was prescribed hydroxyzine 25 mg x 60 pills for anxiety. She states that she only takes a half of tablet and that seems to help her anxiety. She also states that her Methadone was decreased from 35 mg to 29 mg in May when she began having panic  attacks. She states that she is wanting to taper off the Methadone because she feels like she is at a good point in her life to stop the maintenance treatment. She reports a sobriety of 11 years. She states that she currently receives opiate replacement with [facility]. She denies outpatient psychiatry or therapy. She denies depressive symptoms. She denies SI/HI/AVH. There is no evidence that the patient is currently responding to internal or external stimuli she reports fair sleep and appetite. She states that she resides with her 65 year old son. She identifies her church members and sponsor as her support system.   I discussed with the patient that she may be exhibiting withdrawal symptoms from the decreased in methadone dosage. Patient was advised to speak with her psychiatrist at [facility] regarding medication adjustments and side effects. She states that she spoke to the psychiatrist at [facility] and he offered to go up on her medication but she does not want her current medication increased. She states that she wants her case to be investigated to find out why she is having panic attacks. She denies current stressors or triggers attributing to increasing anxiety and frequent panic attacks. I discussed with the patient following up with outpatient psychiatry for medication management and therapy to address anxiety and panic attacks. Patient verbalizes understanding and agrees to the stated plan. Per chart review on 04/02/22, patient was prescribed hydroxyzine 25 mg by mouth every 8 hours as needed for anxiety. Patient  advised to continue hydroxyzine 25 mg every 8 hours as needed for anxiety.  Psychiatric Specialty Exam  Presentation  General Appearance:Appropriate for Environment  Eye Contact:Fair  Speech:Clear and Coherent  Speech Volume:Normal  Handedness:Right   Mood and Affect  Mood:Anxious  Affect:Congruent   Thought Process  Thought Processes:Coherent; Goal  Directed  Descriptions of Associations:Intact  Orientation:Full (Time, Place and Person)  Thought Content:Logical  Diagnosis of Schizophrenia or Schizoaffective disorder in past: No   Hallucinations:None  Ideas of Reference:None  Suicidal Thoughts:No Without Intent; Without Plan (Patient states "What is the point of living if I have to continue living like this.")  Homicidal Thoughts:No   Sensorium  Memory:Immediate Fair; Recent Fair; Remote Fair  Judgment:Fair  Insight:Fair   Executive Functions  Concentration:Fair  Attention Span:Fair  Recall:Fair  Fund of Knowledge:Fair  Language:Fair   Psychomotor Activity  Psychomotor Activity:Normal   Assets  Assets:Communication Skills; Desire for Improvement; Financial Resources/Insurance; Housing; Leisure Time; Physical Health; Resilience; Social Support; Transportation   Sleep  Sleep:Fair  Number of hours: 1.5   No data recorded  Physical Exam: Physical Exam HENT:     Head: Normocephalic.     Nose: Nose normal.  Eyes:     Conjunctiva/sclera: Conjunctivae normal.  Cardiovascular:     Rate and Rhythm: Normal rate.  Pulmonary:     Effort: Pulmonary effort is normal.  Musculoskeletal:        General: Normal range of motion.     Cervical back: Normal range of motion.  Neurological:     Mental Status: She is alert and oriented to person, place, and time.    Review of Systems  Constitutional: Negative.   HENT: Negative.    Eyes: Negative.   Respiratory: Negative.    Cardiovascular: Negative.   Gastrointestinal: Negative.   Genitourinary: Negative.   Musculoskeletal: Negative.   Skin: Negative.   Neurological: Negative.   Endo/Heme/Allergies: Negative.   Psychiatric/Behavioral:  The patient is nervous/anxious.    Blood pressure 140/83, pulse (!) 111, temperature 98.6 F (37 C), temperature source Oral, resp. rate 19, SpO2 97 %. There is no height or weight on file to calculate  BMI.  Musculoskeletal: Strength & Muscle Tone: within normal limits Gait & Station: normal Patient leans: N/A   BH[facility] MSE Discharge Disposition for Follow up and Recommendations: Based on my evaluation the patient does not appear to have an emergency medical condition and can be discharged with resources and follow up care in outpatient services for Medication Management, Individual Therapy, and Group Therapy  Good morning!  It is imperative that you follow through with treatment recommendations within 7-10 days from the day of discharge to maintain stabilization and mitigate further risk to your safety and overall mental well-being.  Based on what you have shared with [place], a list of providers for psychiatry, therapy, and substance use treatment has been provided below to get you started.  In case of an urgent emergency, you have the option of contacting the Mobile Crisis Unit with [facility] at [phone].  When coming off of Methadone, it is very important to taper at a pace that is comfortable for you and is medically monitored by a provider who provides this service.  This list includes providers who have opioid replacement therapies in case you no longer want to do it through [facility].  It is easy to become reliant on ORT over the years that the thought of coming off of it can create anxiety because of the psychological dependency  on it.  It is best practice to be linked with a therapist who can teach you the coping skills to bring the level of anxiety down to where you are able to manage it with what skills and grounding techniques you have learned.  Mindfulness practices, meditation, exercise, restorative sleep, and yoga are extremely helpful.  You also have the option to call 211 or go to www.nc211.org for additional resources.  Outpatient Therapy and Psychiatry for Medicare Recipients  [facility] [address] [place], [place], [address] [phone] phone  [facility] [address] [place], [place], [address] [phone] phone  [facility] [address] [place], [place], [address] [phone] phone  [facility] [address] [place], [place], [address] [phone] phone [phone] fax  [facility], [place] [address], [place], [address] [phone] phone  [facility] [address] [place], [place], [address] [phone] phone [phone] fax  [facility] [address] [place], [place], [address] [phone] phone  [facility] Kussmaul [address] [place], [place], [address] [phone] phone  [facility] [address] [place], [place], [address] [phone] phone [phone] fax    SUBSTANCE USE TREATMENT  [facility]) [address], [place] [address] [phone]  ((CD-IOP is currently not operational; Opioid replacement clinic is operational))   [facility] (private insurance) [address], [place] [address] [phone]  ((CD-IOP (afternoon program); individual therapy))   [facility] (Medicaid for [employer] and [employer]; some private insurance) [address] [place], [place] [address] [phone]  ((CD-IOP))   [facility] & private insurance) [phone] [place], [place] [address] [phone]  ((CD-IOP (morning & evening programs); individual therapy))   OPIOID REPLACEMENT PROVIDERS:  [facility]) [address], [place] [address] [phone]  [facility] [address] [place], [place] [address] [phone]  [facility] [address] [place], [place] [address] [phone]   12 STEP PROGRAMS:  Alcoholics Anonymous of [place] [employer].be  Narcotics Anonymous of [place] [employer].dk  Al-Anon of [place], [place] www.[place]alanon.org/find-meetings.html  Nar-Anon https://nar-anon.org/find-a-meetin   Layla Barter, NP 04/05/2022, 11:58 AM

## 2022-04-05 NOTE — BH Assessment (Signed)
LCSW Progress Note   Per Liborio Nixon, NP, this pt does not require psychiatric hospitalization at this time.  Pt is psychiatrically cleared.  Discharge instructions include several resources for outpatient therapy, psychiatry, and substance use treatment providers along with the mobile crisis number.  EDP Liborio Nixon, NP, has been notified.  Hansel Starling, MSW, LCSW [facility] [phone] or [phone]

## 2022-04-06 NOTE — Progress Notes (Signed)
Internal Medicine Clinic Attending  Case discussed with Dr. Katsadouros  At the time of the visit.  We reviewed the resident's history and exam and pertinent patient test results.  I agree with the assessment, diagnosis, and plan of care documented in the resident's note.  

## 2022-04-07 ENCOUNTER — Ambulatory Visit (INDEPENDENT_AMBULATORY_CARE_PROVIDER_SITE_OTHER): Payer: Medicare HMO | Admitting: Student

## 2022-04-07 ENCOUNTER — Encounter (HOSPITAL_COMMUNITY): Payer: Self-pay

## 2022-04-07 ENCOUNTER — Emergency Department (HOSPITAL_COMMUNITY): Payer: Medicare HMO

## 2022-04-07 ENCOUNTER — Emergency Department (HOSPITAL_COMMUNITY)
Admission: EM | Admit: 2022-04-07 | Discharge: 2022-04-08 | Discharge status: Against Medical Advice | Payer: Medicare HMO | Attending: Emergency Medicine | Admitting: Emergency Medicine

## 2022-04-07 VITALS — BP 150/85 | HR 111

## 2022-04-07 DIAGNOSIS — Z5321 Procedure and treatment not carried out due to patient leaving prior to being seen by health care provider: Secondary | ICD-10-CM | POA: Insufficient documentation

## 2022-04-07 DIAGNOSIS — F411 Generalized anxiety disorder: Secondary | ICD-10-CM

## 2022-04-07 DIAGNOSIS — F419 Anxiety disorder, unspecified: Secondary | ICD-10-CM | POA: Diagnosis not present

## 2022-04-07 DIAGNOSIS — R002 Palpitations: Secondary | ICD-10-CM | POA: Insufficient documentation

## 2022-04-07 DIAGNOSIS — R110 Nausea: Secondary | ICD-10-CM | POA: Diagnosis not present

## 2022-04-07 DIAGNOSIS — K5903 Drug induced constipation: Secondary | ICD-10-CM

## 2022-04-07 DIAGNOSIS — R079 Chest pain, unspecified: Secondary | ICD-10-CM | POA: Diagnosis not present

## 2022-04-07 DIAGNOSIS — R69 Illness, unspecified: Secondary | ICD-10-CM | POA: Diagnosis not present

## 2022-04-07 DIAGNOSIS — R42 Dizziness and giddiness: Secondary | ICD-10-CM | POA: Diagnosis not present

## 2022-04-07 LAB — BASIC METABOLIC PANEL
Anion gap: 11 (ref 5–15)
BUN: 13 mg/dL (ref 6–20)
CO2: 24 mmol/L (ref 22–32)
Calcium: 9.3 mg/dL (ref 8.9–10.3)
Chloride: 103 mmol/L (ref 98–111)
Creatinine, Ser: 1.11 mg/dL — ABNORMAL HIGH (ref 0.44–1.00)
GFR, Estimated: 59 mL/min — ABNORMAL LOW (ref >60)
Glucose, Bld: 110 mg/dL — ABNORMAL HIGH (ref 70–99)
Potassium: 3.6 mmol/L (ref 3.5–5.1)
Sodium: 138 mmol/L (ref 135–145)

## 2022-04-07 LAB — CBC
HCT: 33.6 % — ABNORMAL LOW (ref 36.0–46.0)
Hemoglobin: 10.1 g/dL — ABNORMAL LOW (ref 12.0–15.0)
MCH: 22 pg — ABNORMAL LOW (ref 26.0–34.0)
MCHC: 30.1 g/dL (ref 30.0–36.0)
MCV: 73 fL — ABNORMAL LOW (ref 80.0–100.0)
Platelets: 223 10*3/uL (ref 150–400)
RBC: 4.6 MIL/uL (ref 3.87–5.11)
RDW: 18.6 % — ABNORMAL HIGH (ref 11.5–15.5)
WBC: 6.2 10*3/uL (ref 4.0–10.5)
nRBC: 0 % (ref 0.0–0.2)

## 2022-04-07 LAB — TROPONIN I (HIGH SENSITIVITY): Troponin I (High Sensitivity): 3 ng/L (ref <18)

## 2022-04-07 NOTE — Assessment & Plan Note (Addendum)
Patient presents for concern of constipation.  Patient states that she normally has 1 bowel movement every 2 days.  She states that for the past week and a half she has been having bowel movements every 5-6 days.  She states that she is straining.  Patient reports that she is eating less because she gets fuller faster.  Patient states that she recently was seen by the the emergency department regarding her anxiety and was given hydroxyzine for anxiety attacks.  Since then she has developed this constipation.  She states that she has tried to use Dulcolax and MiraLAX with some relief.  Patient reports that 4 days ago she had a minor bowel movement.  Patient denies any nausea or vomiting.  Patient reports passing gas.  She informs me that she was seen in the emergency department regarding her constipation.  This was earlier in July.  She states they took an x-ray, which showed no obstruction but did show mild fecal burden.  During my exam, patient goes to the bathroom to try to have a bowel movement.  She reports that she did have a bowel movement while in the bathroom.  She states that it made her feel better.  She states that she has been doing MiraLAX every morning for the past 4 days.  Patient also reports that she has been feeling really dry, and has been drinking lots of water.  Plan: - Given her history of methadone, and the addition of hydroxyzine, I believe this combo is contributing to her constipation. - I offer the patient a motility agent in senna S, but the patient declines stating that she has used this before and it made her have lots of diarrhea.  I do inform her that if she does want this Senna-S at anytime, she can call back and we can give it to her - I encouraged her to continue using MiraLAX as this has been helping.  She states that she only uses a little bit at a time, and I told her to use more if she needed.

## 2022-04-07 NOTE — ED Triage Notes (Signed)
Pt states that they decreased her methadone dose and now she feels hot all over and feeling anxious. C/o of some nausea and dizziness

## 2022-04-07 NOTE — Patient Instructions (Addendum)
Ms. Gural ,Thank you for allowing me to take part in your care today.  Here are your instructions.  1.  It was a pleasure meeting you in clinic today.  We discussed your constipation today.  2.  Please continue taking your MiraLAX.  You can fill the up half way if you feel like you are constipated more than usual.  If you feel like this is not working you can call us and we can send you the senna-S.  3.  Regarding your anxiety, please continue to follow-up with behavioral health for this.  You can use the hydroxyzine as needed.  Use the hydroxyzine on very bad days.  It is likely that this medication is causing you to be more constipated, but this would hold you over until you see your specialist on August 8.  4.  Please follow-up if you start vomiting or stop passing gas.  5.  Please remember that you will be okay.   Thank you, Dr. Allena Katz  If you have any other questions please contact the internal medicine clinic at [phone]

## 2022-04-07 NOTE — ED Notes (Signed)
Pt called several times for triage, no answer.  

## 2022-04-07 NOTE — ED Provider Triage Note (Signed)
Emergency Medicine Provider Triage Evaluation Note  Paula Leon , Leon 56 y.o. female  was evaluated in triage.  Pt complains of palpitations onset today.  She notes that she had her methadone dose decreased today.  Her last dose of methadone was today.  Has associated increased anxiety, nausea, dizziness.  Denies vomiting or abdominal pain.  Review of Systems  Positive: As per HPI Negative:   Physical Exam  BP (!) 151/93   Pulse (!) 118   Temp 98.7 F (37.1 C) (Oral)   Resp 15   SpO2 99%  Gen:   Awake, patient anxious in the room and pacing. Resp:  Normal effort  MSK:   Moves extremities without difficulty  Other:    Medical Decision Making  Medically screening exam initiated at 8:56 PM.  Appropriate orders placed.  Paula Leon was informed that the remainder of the evaluation will be completed by another provider, this initial triage assessment does not replace that evaluation, and the importance of remaining in the ED until their evaluation is complete.  Work-up initiated   Paula Ey A, PA-C 04/07/22 2058

## 2022-04-07 NOTE — Assessment & Plan Note (Addendum)
Assessment: Patient has a longstanding history of generalized anxiety disorder.  Patient reports that she has used many medications such as Paxil, BuSpar, and trazodone.  She states that these medications did not help her as much.  She was recently seen in the emergency department on 07/08 for an anxiety attack.  She was given hydroxyzine.  She states that this has been helping.  Patient also reports that she is scheduled to see a psychiatrist at behavioral health on August 10.  On exam today, patient was looking very uncomfortable, she was fidgety, and was pacing.  She reports that her methadone clinic has been titrating down her methadone.  I do have some concern that this could be related to titrating down her methadone as well.  I informed her to tell her methadone clinic about her anxiety symptoms.  She states that she has been journaling and walking to try to relieve her anxiety.  Patient is tearful in exam room stating that she is tired and wants help.  Plan: - Plan is to have her follow-up with behavioral health on August 10. - Given her clinical symptoms today in the exam room, I want her to remain on her hydroxyzine as needed.  I informed her that this can dry her out, but it is important for her to take it if she has a anxiety attack.  This can hold her over until her behavioral health appointment on August 10, which at that point I recommend her provider switch her from hydroxyzine to something that does not potentiate her constipation.

## 2022-04-07 NOTE — Progress Notes (Signed)
CC: Constipation  HPI:  [name] is a 56 y.o. female with a past medical history of opioid use disorder, generalized anxiety disorder, and constipation who presents to the clinic with concerns of constipation.  Patient states that she usually has a bowel movement every 2 days, and recently she has been having bowel movements every 5 to 6 days.  Patient reports that she was recently seen in the emergency department for this constipation, and was discharged on MiraLAX.  Patient reports that they also took an x-ray of her abdomen, which did not show any obstruction.  Please see assessment and plan for full HPI.  Past Medical History:  Diagnosis Date   Anemia    IDA   Depression    Hernia of abdominal wall    History of drug abuse (HCC)    has not used in ten years   Substance abuse (HCC)    2012 QUIT Heroin   Transfusion history    due to anemia with hb 4.0     Current Outpatient Medications:    bisacodyl (DULCOLAX) 10 MG suppository, Place 1 suppository (10 mg total) rectally as needed for moderate constipation., Disp: 5 suppository, Rfl: 0   busPIRone (BUSPAR) 5 MG tablet, Take 1 tablet (5 mg total) by mouth daily. As needed for panic attacks, Disp: 30 tablet, Rfl: 2   diclofenac Sodium (VOLTAREN) 1 % GEL, Apply 4 g topically 4 (four) times daily. (Patient taking differently: Apply 4 g topically 4 (four) times daily as needed (joint pain).), Disp: 150 g, Rfl: 1   docusate sodium (COLACE) 100 MG capsule, Take 1 capsule (100 mg total) by mouth every 12 (twelve) hours., Disp: 60 capsule, Rfl: 0   ferrous sulfate 325 (65 FE) MG tablet, Take 1 tablet (325 mg total) by mouth daily with breakfast., Disp: 90 tablet, Rfl: 2   hydrOXYzine (ATARAX) 25 MG tablet, Take 1 tablet (25 mg total) by mouth every 8 (eight) hours as needed., Disp: 60 tablet, Rfl: 0   methadone (DOLOPHINE) 10 MG/5ML solution, Take 35 mg by mouth every morning., Disp: , Rfl:    senna (SENOKOT) 8.6 MG TABS tablet, Take  2 tablets (17.2 mg total) by mouth daily., Disp: 120 tablet, Rfl: 0  Review of Systems:   GI: Patient reports constipation Psych: Patient reports anxiety  Physical Exam:  Vitals:   04/07/22 1619  BP: (!) 150/85  Pulse: (!) 111  SpO2: 99%   General: Alert and orientated x3. Eyes: EOM intact  Head: Normocephalic, atraumatic  Cardio: Tachycardic with regular rhythm.  No murmurs noted. Pulmonary: Clear to ausculation bilaterally with no rales, rhonchi, and crackles  Abdomen: Patient has a soft abdomen.  Normoactive bowel sounds.  There is some tenderness to her left upper and lower quadrants.  No rebound tenderness appreciated. Psych: Patient is anxious on exam.  Patient is fidgeting and pacing.  Patient looks uncomfortable.   Assessment & Plan:   Constipation Patient presents for concern of constipation.  Patient states that she normally has 1 bowel movement every 2 days.  She states that for the past week and a half she has been having bowel movements every 5-6 days.  She states that she is straining.  Patient reports that she is eating less because she gets fuller faster.  Patient states that she recently was seen by the the emergency department regarding her anxiety and was given hydroxyzine for anxiety attacks.  Since then she has developed this constipation.  She states that  she has tried to use Dulcolax and MiraLAX with some relief.  Patient reports that 4 days ago she had a minor bowel movement.  Patient denies any nausea or vomiting.  Patient reports passing gas.  She informs me that she was seen in the emergency department regarding her constipation.  This was earlier in July.  She states they took an x-ray, which showed no obstruction but did show mild fecal burden.  During my exam, patient goes to the bathroom to try to have a bowel movement.  She reports that she did have a bowel movement while in the bathroom.  She states that it made her feel better.  She states that she has  been doing MiraLAX every morning for the past 4 days.  Patient also reports that she has been feeling really dry, and has been drinking lots of water.  Plan: - Given her history of methadone, and the addition of hydroxyzine, I believe this combo is contributing to her constipation. - I offer the patient a motility agent in senna S, but the patient declines stating that she has used this before and it made her have lots of diarrhea.  I do inform her that if she does want this Senna-S at anytime, she can call back and we can give it to her - I encouraged her to continue using MiraLAX as this has been helping.  She states that she only uses a little bit at a time, and I told her to use more if she needed.  Generalized anxiety disorder Assessment: Patient has a longstanding history of generalized anxiety disorder.  Patient reports that she has used many medications such as Paxil, BuSpar, and trazodone.  She states that these medications did not help her as much.  She was recently seen in the emergency department on 07/08 for an anxiety attack.  She was given hydroxyzine.  She states that this has been helping.  Patient also reports that she is scheduled to see a psychiatrist at behavioral health on August 10.  On exam today, patient was looking very uncomfortable, she was fidgety, and was pacing.  She reports that her methadone clinic has been titrating down her methadone.  I do have some concern that this could be related to titrating down her methadone as well.  I informed her to tell her methadone clinic about her anxiety symptoms.  She states that she has been journaling and walking to try to relieve her anxiety.  Patient is tearful in exam room stating that she is tired and wants help.  Plan: - Plan is to have her follow-up with behavioral health on August 10. - Given her clinical symptoms today in the exam room, I want her to remain on her hydroxyzine as needed.  I informed her that this can dry her  out, but it is important for her to take it if she has a anxiety attack.  This can hold her over until her behavioral health appointment on August 10, which at that point I recommend her provider switch her from hydroxyzine to something that does not potentiate her constipation.     Patient seen with Dr. Marquita Palms, DO PGY-1 Internal Medicine Resident  Pager: [phone]

## 2022-04-08 LAB — TROPONIN I (HIGH SENSITIVITY): Troponin I (High Sensitivity): 3 ng/L (ref <18)

## 2022-04-08 NOTE — ED Notes (Signed)
X3 no response and not visible in the lobby 

## 2022-04-11 ENCOUNTER — Ambulatory Visit (INDEPENDENT_AMBULATORY_CARE_PROVIDER_SITE_OTHER): Payer: Medicare HMO | Admitting: Student in an Organized Health Care Education/Training Program

## 2022-04-11 ENCOUNTER — Encounter: Payer: Medicare Other | Admitting: Student

## 2022-04-11 ENCOUNTER — Encounter (HOSPITAL_COMMUNITY): Payer: Self-pay | Admitting: Student in an Organized Health Care Education/Training Program

## 2022-04-11 VITALS — BP 128/89 | HR 89 | Wt 222.4 lb

## 2022-04-11 DIAGNOSIS — F410 Panic disorder [episodic paroxysmal anxiety] without agoraphobia: Secondary | ICD-10-CM

## 2022-04-11 DIAGNOSIS — F1120 Opioid dependence, uncomplicated: Secondary | ICD-10-CM | POA: Diagnosis not present

## 2022-04-11 DIAGNOSIS — R69 Illness, unspecified: Secondary | ICD-10-CM | POA: Diagnosis not present

## 2022-04-11 DIAGNOSIS — F411 Generalized anxiety disorder: Secondary | ICD-10-CM

## 2022-04-11 MED ORDER — SERTRALINE HCL 25 MG PO TABS: 25.0000 mg | ORAL_TABLET | Freq: Every day | ORAL | 0 refills | Status: DC
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
## 2022-04-16 ENCOUNTER — Encounter (HOSPITAL_COMMUNITY): Payer: Self-pay | Admitting: Emergency Medicine

## 2022-04-16 ENCOUNTER — Emergency Department (HOSPITAL_COMMUNITY)
Admission: EM | Admit: 2022-04-16 | Discharge: 2022-04-17 | Disposition: A | Discharge status: Home / Self Care | Payer: Medicare HMO | Attending: Emergency Medicine | Admitting: Emergency Medicine

## 2022-04-16 DIAGNOSIS — Z79899 Other long term (current) drug therapy: Secondary | ICD-10-CM | POA: Insufficient documentation

## 2022-04-16 DIAGNOSIS — F17210 Nicotine dependence, cigarettes, uncomplicated: Secondary | ICD-10-CM | POA: Insufficient documentation

## 2022-04-16 DIAGNOSIS — R002 Palpitations: Secondary | ICD-10-CM | POA: Diagnosis not present

## 2022-04-16 DIAGNOSIS — R69 Illness, unspecified: Secondary | ICD-10-CM | POA: Diagnosis not present

## 2022-04-16 DIAGNOSIS — Z5321 Procedure and treatment not carried out due to patient leaving prior to being seen by health care provider: Secondary | ICD-10-CM | POA: Insufficient documentation

## 2022-04-16 DIAGNOSIS — F419 Anxiety disorder, unspecified: Secondary | ICD-10-CM | POA: Insufficient documentation

## 2022-04-16 LAB — BASIC METABOLIC PANEL
Anion gap: 10 (ref 5–15)
BUN: 10 mg/dL (ref 6–20)
CO2: 22 mmol/L (ref 22–32)
Calcium: 9 mg/dL (ref 8.9–10.3)
Chloride: 104 mmol/L (ref 98–111)
Creatinine, Ser: 1 mg/dL (ref 0.44–1.00)
GFR, Estimated: >60 mL/min (ref >60)
Glucose, Bld: 113 mg/dL — ABNORMAL HIGH (ref 70–99)
Potassium: 3.6 mmol/L (ref 3.5–5.1)
Sodium: 136 mmol/L (ref 135–145)

## 2022-04-16 LAB — CBC WITH DIFFERENTIAL/PLATELET
Abs Immature Granulocytes: 0.02 10*3/uL (ref 0.00–0.07)
Basophils Absolute: 0 10*3/uL (ref 0.0–0.1)
Basophils Relative: 0 %
Eosinophils Absolute: 0 10*3/uL (ref 0.0–0.5)
Eosinophils Relative: 0 %
HCT: 33.7 % — ABNORMAL LOW (ref 36.0–46.0)
Hemoglobin: 10.3 g/dL — ABNORMAL LOW (ref 12.0–15.0)
Immature Granulocytes: 0 %
Lymphocytes Relative: 15 %
Lymphs Abs: 0.9 10*3/uL (ref 0.7–4.0)
MCH: 21.9 pg — ABNORMAL LOW (ref 26.0–34.0)
MCHC: 30.6 g/dL (ref 30.0–36.0)
MCV: 71.5 fL — ABNORMAL LOW (ref 80.0–100.0)
Monocytes Absolute: 0.4 10*3/uL (ref 0.1–1.0)
Monocytes Relative: 6 %
Neutro Abs: 5 10*3/uL (ref 1.7–7.7)
Neutrophils Relative %: 79 %
Platelets: 230 10*3/uL (ref 150–400)
RBC: 4.71 MIL/uL (ref 3.87–5.11)
RDW: 18.6 % — ABNORMAL HIGH (ref 11.5–15.5)
WBC: 6.4 10*3/uL (ref 4.0–10.5)
nRBC: 0 % (ref 0.0–0.2)

## 2022-04-16 LAB — RAPID URINE DRUG SCREEN, HOSP PERFORMED
Amphetamines: NOT DETECTED
Barbiturates: NOT DETECTED
Benzodiazepines: NOT DETECTED
Cocaine: NOT DETECTED
Opiates: NOT DETECTED
Tetrahydrocannabinol: NOT DETECTED

## 2022-04-16 LAB — URINALYSIS, ROUTINE W REFLEX MICROSCOPIC
Bacteria, UA: NONE SEEN
Bilirubin Urine: NEGATIVE
Glucose, UA: NEGATIVE mg/dL
Ketones, ur: NEGATIVE mg/dL
Nitrite: NEGATIVE
Protein, ur: NEGATIVE mg/dL
Specific Gravity, Urine: 1.006 (ref 1.005–1.030)
pH: 6 (ref 5.0–8.0)

## 2022-04-16 NOTE — ED Triage Notes (Signed)
Patient here with complaint of feeling anxious since taking her methadone dose at 1000 today. Patient is alert, oriented, ambulatory, and pacing in the triage room. PAtient states she tried smoking a cigarette but that made her more jittery.

## 2022-04-16 NOTE — ED Provider Triage Note (Signed)
Emergency Medicine Provider Triage Evaluation Note  Paula Leon , a 56 y.o. female  was evaluated in triage.  Pt complains of palpitations and feeling anxious after taking her Zoloft and methadone today.  States she took the same medication combination yesterday without difficulty but is now feeling her heart race.  Denies other drug use.  Review of Systems  Positive: Palpitations, anxiety Negative: Chest pain, shortness of breath  Physical Exam  BP 132/88   Pulse (!) 130   Temp 99.1 F (37.3 C) (Oral)   Resp 18   SpO2 95%  Gen:   Awake, no distress   Resp:  Normal effort  MSK:   Moves extremities without difficulty  Other:  HR tachy, anxious  Medical Decision Making  Medically screening exam initiated at 5:52 PM.  Appropriate orders placed.  Paula Leon was informed that the remainder of the evaluation will be completed by another provider, this initial triage assessment does not replace that evaluation, and the importance of remaining in the ED until their evaluation is complete.     Jeannie Fend, PA-C 04/16/22 1754

## 2022-04-16 NOTE — Progress Notes (Signed)
Internal Medicine Clinic Attending  I saw and evaluated the patient.  I personally confirmed the key portions of the history and exam documented by Dr. Allena Katz and I reviewed pertinent patient test results.  The assessment, diagnosis, and plan were formulated together and I agree with the documentation in the resident's note. Multiple medications including iron, hydroxyzine, and methadone suspected to be contributing to constipation.

## 2022-04-17 NOTE — ED Notes (Signed)
Pt not answering in lobby to go to a room

## 2022-04-18 DIAGNOSIS — R69 Illness, unspecified: Secondary | ICD-10-CM | POA: Diagnosis not present

## 2022-04-18 DIAGNOSIS — F1120 Opioid dependence, uncomplicated: Secondary | ICD-10-CM | POA: Diagnosis not present

## 2022-04-19 ENCOUNTER — Ambulatory Visit (INDEPENDENT_AMBULATORY_CARE_PROVIDER_SITE_OTHER): Payer: Medicare HMO | Admitting: Student in an Organized Health Care Education/Training Program

## 2022-04-19 ENCOUNTER — Telehealth (HOSPITAL_COMMUNITY): Payer: Self-pay | Admitting: Student

## 2022-04-19 DIAGNOSIS — F410 Panic disorder [episodic paroxysmal anxiety] without agoraphobia: Secondary | ICD-10-CM | POA: Diagnosis not present

## 2022-04-19 DIAGNOSIS — R69 Illness, unspecified: Secondary | ICD-10-CM | POA: Diagnosis not present

## 2022-04-19 DIAGNOSIS — F411 Generalized anxiety disorder: Secondary | ICD-10-CM

## 2022-04-19 MED ORDER — SERTRALINE HCL 25 MG PO TABS: 25.0000 mg | ORAL_TABLET | Freq: Every day | ORAL | 0 refills | Status: DC

## 2022-04-19 NOTE — Progress Notes (Signed)
BH MD/PA/NP OP Progress Note  04/19/2022 2:53 PM Paula Leon  MRN:  [id number]  Chief Complaint: No chief complaint on file.  HPI:  Paula Leon is a 56 yr old female who presents to establish care and for medication management.  PPHx is significant for Anxiety, Panic Attacks, and Polysubstance Abuse (Heroin, Cocaine, EtOH), currently on Methadone at [facility], no Suicide Attempts nor Self Injurious Behavior, and 1 prior hospitalization (2015 [address]).  She reports that she had been nervous to take the full dose of Zoloft and so had been breaking the tablet in half and only taking half.  She reports that she had some sedation during the day since starting the Zoloft so she moved it to the evening and this resolved.  She reports she had a mild headache the first day after starting the Zoloft but that this has resolved and not come back.  She reports she was hesitant to take the full dose of the Zoloft because she thought it might have been too strong.  Encouraged her to take the full dose at this time and she was agreeable to this.  She does report that her symptoms have been better and that her panic attacks are lessening since starting the medication.  She already has a follow-up appointment scheduled for August 22 so discussed sending in a 2-week refill of the Zoloft to ensure she has enough to make it to the next appointment and that at that time we would discuss further increasing the Zoloft.  She reports that her pain clinic is continuing to decrease her methadone every 3 weeks and soon she is currently at 28 mg daily.  She reports no SI, HI, or AVH.  She reports her appetite is doing well.  She reports her sleep is improving.  She reports no other concerns at present.   Visit Diagnosis:    ICD-10-CM   1. Generalized anxiety disorder with panic attacks  F41.1 sertraline (ZOLOFT) 25 MG tablet   F41.0       Past Psychiatric History: Anxiety, Panic Attacks, and Polysubstance  Abuse (Heroin, Cocaine, EtOH), currently on Methadone at [facility], no Suicide Attempts nor Self Injurious Behavior, and 1 prior hospitalization (2015 [address]).  Past Medical History:  Past Medical History:  Diagnosis Date   Anemia    IDA   Depression    Hernia of abdominal wall    History of drug abuse (HCC)    has not used in ten years   Substance abuse (HCC)    2012 QUIT Heroin   Transfusion history    due to anemia with hb 4.0    Past Surgical History:  Procedure Laterality Date   INSERTION OF MESH N/A 04/19/2019   Procedure: INSERTION OF MESH;  Surgeon: Darnell Level, MD;  Location: [facility];  Service: General;  Laterality: N/A;   SALPINGECTOMY Left    due to ruptured ectopic   VENTRAL HERNIA REPAIR N/A 04/19/2019   Procedure: VENTRAL HERNIA REPAIR;  Surgeon: Darnell Level, MD;  Location: [facility];  Service: General;  Laterality: N/A;    Family Psychiatric History: Multiple Maternal Members- EtOH abuse Maternal Aunt- Anxiety  Family History:  Family History  Problem Relation Age of Onset   Cancer Mother    Cancer Father    Colon cancer Neg Hx    Colon polyps Neg Hx    Esophageal cancer Neg Hx    Rectal cancer Neg Hx    Stomach cancer  Neg Hx     Social History:  Social History   Socioeconomic History   Marital status: Single    Spouse name: Not on file   Number of children: Not on file   Years of education: Not on file   Highest education level: Not on file  Occupational History   Not on file  Tobacco Use   Smoking status: Every Day    Packs/day: 0.25    Types: Cigarettes   Smokeless tobacco: Never   Tobacco comments:    4-5  cigarettes per day.   Wants to stop  Vaping Use   Vaping Use: Never used  Substance and Sexual Activity   Alcohol use: Not Currently    Comment: 2012   Drug use: Not Currently    Types: Heroin, Marijuana    Comment: last time used drugs was 2012   Sexual activity: Not Currently     Birth control/protection: Surgical  Other Topics Concern   Not on file  Social History Narrative   Not on file   Social Determinants of Health   Financial Resource Strain: Medium Risk (02/25/2022)   Overall Financial Resource Strain (CARDIA)    Difficulty of Paying Living Expenses: Somewhat hard  Food Insecurity: Food Insecurity Present (02/25/2022)   Hunger Vital Sign    Worried About Running Out of Food in the Last Year: Often true    Ran Out of Food in the Last Year: Often true  Transportation Needs: No Transportation Needs (02/25/2022)   PRAPARE - Administrator, Civil Service (Medical): No    Lack of Transportation (Non-Medical): No  Physical Activity: Insufficiently Active (02/25/2022)   Exercise Vital Sign    Days of Exercise per Week: 7 days    Minutes of Exercise per Session: 20 min  Stress: Stress Concern Present (02/25/2022)   [employer] of Occupational Health - Occupational Stress Questionnaire    Feeling of Stress : Very much  Social Connections: Moderately Isolated (02/25/2022)   Social Connection and Isolation Panel [NHANES]    Frequency of Communication with Friends and Family: More than three times a week    Frequency of Social Gatherings with Friends and Family: More than three times a week    Attends Religious Services: More than 4 times per year    Active Member of [employer] or Organizations: No    Attends Banker Meetings: Never    Marital Status: Never married    Allergies:  Allergies  Allergen Reactions   Penicillins Swelling    Throat swelling    Metabolic Disorder Labs: Lab Results  Component Value Date   HGBA1C 5.4 02/27/2022   MPG 108.28 02/27/2022   No results found for: "PROLACTIN" Lab Results  Component Value Date   CHOL 137 02/27/2022   TRIG 77 02/27/2022   HDL 46 02/27/2022   CHOLHDL 3.0 02/27/2022   VLDL 15 02/27/2022   LDLCALC 76 02/27/2022   Lab Results  Component Value Date   TSH 1.226 02/27/2022    TSH 1.544 02/23/2022    Therapeutic Level Labs: No results found for: "LITHIUM" No results found for: "VALPROATE" No results found for: "CBMZ"  Current Medications: Current Outpatient Medications  Medication Sig Dispense Refill   bisacodyl (DULCOLAX) 10 MG suppository Place 1 suppository (10 mg total) rectally as needed for moderate constipation. 5 suppository 0   diclofenac Sodium (VOLTAREN) 1 % GEL Apply 4 g topically 4 (four) times daily. (Patient taking differently: Apply 4 g topically 4 (  four) times daily as needed (joint pain).) 150 g 1   docusate sodium (COLACE) 100 MG capsule Take 1 capsule (100 mg total) by mouth every 12 (twelve) hours. 60 capsule 0   ferrous sulfate 325 (65 FE) MG tablet Take 1 tablet (325 mg total) by mouth daily with breakfast. 90 tablet 2   hydrOXYzine (ATARAX) 25 MG tablet Take 1 tablet (25 mg total) by mouth every 8 (eight) hours as needed. 60 tablet 0   methadone (DOLOPHINE) 10 MG/5ML solution Take 35 mg by mouth every morning.     senna (SENOKOT) 8.6 MG TABS tablet Take 2 tablets (17.2 mg total) by mouth daily. 120 tablet 0   sertraline (ZOLOFT) 25 MG tablet Take 1 tablet (25 mg total) by mouth daily. 14 tablet 0   No current facility-administered medications for this visit.     Musculoskeletal: Strength & Muscle Tone: within normal limits Gait & Station: normal Patient leans: N/A  Psychiatric Specialty Exam: Review of Systems  Respiratory:  Negative for shortness of breath.   Cardiovascular:  Negative for chest pain.  Gastrointestinal:  Negative for abdominal pain, constipation, diarrhea, nausea and vomiting.  Neurological:  Negative for dizziness, weakness and headaches.  Psychiatric/Behavioral:  Negative for hallucinations, self-injury, sleep disturbance and suicidal ideas. The patient is nervous/anxious.     There were no vitals taken for this visit.There is no height or weight on file to calculate BMI.  General Appearance: Casual and  Fairly Groomed  Eye Contact:  Good  Speech:  Clear and Coherent and Normal Rate  Volume:  Normal  Mood:  Anxious  Affect:  Appropriate and Congruent  Thought Process:  Coherent and Goal Directed  Orientation:  Full (Time, Place, and Person)  Thought Content: Logical   Suicidal Thoughts:  No  Homicidal Thoughts:  No  Memory:  Immediate;   Fair Recent;   Fair  Judgement:  Fair  Insight:  Fair  Psychomotor Activity:  Normal  Concentration:  Concentration: Fair and Attention Span: Fair  Recall:  [employer] of Knowledge: Fair  Language: Good  Akathisia:  Negative  Handed:  Right  AIMS (if indicated): not done  Assets:  Communication Skills Desire for Improvement Housing Resilience Social Support  ADL's:  Intact  Cognition: WNL  Sleep:  Fair   Screenings: GAD-7    Advertising copywriter from 04/11/2022 in [facility] Office Visit from 04/07/2022 in [facility] Office Visit from 02/25/2022 in [facility] Office Visit from 10/28/2021 in [facility] Office Visit from 06/11/2021 in [facility]  Total GAD-7 Score 21 18 20 10 10       PHQ2-9    Flowsheet Row Counselor from 04/11/2022 in [facility] Most recent reading at 04/11/2022  9:07 AM Office Visit from 04/07/2022 in [facility] Most recent reading at 04/07/2022  4:20 PM Office Visit from 02/25/2022 in [facility] Most recent reading at 02/25/2022 11:08 AM Office Visit from 02/25/2022 in [facility] Most recent reading at 02/25/2022 10:46 AM Office Visit from 02/02/2022 in [facility] Most recent reading at 02/02/2022  9:45 AM  PHQ-2 Total Score 0 0 0 0 0  PHQ-9 Total Score -- -- 0 -- 0      Flowsheet Row ED from 04/16/2022 in [facility] EMERGENCY DEPARTMENT ED from 04/07/2022 in  [facility] EMERGENCY DEPARTMENT ED from 04/02/2022 in [facility] EMERGENCY DEPARTMENT  C-SSRS RISK CATEGORY No Risk No Risk No Risk        Assessment and Plan:  Paula Leon is a 56 yr old female who presents to establish care and for medication management.  PPHx is significant for Anxiety, Panic Attacks, and Polysubstance Abuse (Heroin, Cocaine, EtOH), currently on Methadone at [facility], no Suicide Attempts nor Self Injurious Behavior, and 1 prior hospitalization (2015 [address]).   Rion continues to be very anxious, however, she does report that the Zoloft has been helpful at half dose.  She did report some sedation with it so shifted it to the evening.  She will begin to take a full 25 mg tablet starting tonight.  Her follow up is scheduled for Aug 22 so we will send in a 2 week refill of 25 mg Zoloft to ensure she does not run out of medicine prior to our next appointment.  At that time we will most likely increase the Zoloft further.   GAD with Panic Attacks: -Increase Zoloft to 25 mg daily. 14 tablets with 0 refills sent. -Continue Atarax 25 mg TID PRN. No refill sent this time.    Collaboration of Care:   Patient/Guardian was advised Release of Information must be obtained prior to any record release in order to collaborate their care with an outside provider. Patient/Guardian was advised if they have not already done so to contact the registration department to sign all necessary forms in order for [place] to release information regarding their care.   Consent: Patient/Guardian gives verbal consent for treatment and assignment of benefits for services provided during this visit. Patient/Guardian expressed understanding and agreed to proceed.    Lauro Franklin, MD 04/19/2022, 2:53 PM

## 2022-04-19 NOTE — BH Assessment (Signed)
Care Management - [facility] Follow Up Discharges   Writer attempted to make contact with patient today and was unsuccessful.  Writer left a HIPPA compliant voice message.   Per chart review, patient left AMA.

## 2022-04-25 DIAGNOSIS — R69 Illness, unspecified: Secondary | ICD-10-CM | POA: Diagnosis not present

## 2022-04-25 DIAGNOSIS — F1120 Opioid dependence, uncomplicated: Secondary | ICD-10-CM | POA: Diagnosis not present

## 2022-05-02 DIAGNOSIS — R69 Illness, unspecified: Secondary | ICD-10-CM | POA: Diagnosis not present

## 2022-05-02 DIAGNOSIS — F1120 Opioid dependence, uncomplicated: Secondary | ICD-10-CM | POA: Diagnosis not present

## 2022-05-05 ENCOUNTER — Ambulatory Visit (INDEPENDENT_AMBULATORY_CARE_PROVIDER_SITE_OTHER): Payer: Medicare HMO | Admitting: Clinical

## 2022-05-05 DIAGNOSIS — F410 Panic disorder [episodic paroxysmal anxiety] without agoraphobia: Secondary | ICD-10-CM

## 2022-05-05 DIAGNOSIS — F411 Generalized anxiety disorder: Secondary | ICD-10-CM | POA: Diagnosis not present

## 2022-05-05 DIAGNOSIS — R69 Illness, unspecified: Secondary | ICD-10-CM | POA: Diagnosis not present

## 2022-05-06 ENCOUNTER — Emergency Department (HOSPITAL_COMMUNITY)
Admission: EM | Admit: 2022-05-06 | Discharge: 2022-05-07 | Disposition: A | Discharge status: Home / Self Care | Payer: Medicare HMO | Attending: Emergency Medicine | Admitting: Emergency Medicine

## 2022-05-06 DIAGNOSIS — F410 Panic disorder [episodic paroxysmal anxiety] without agoraphobia: Secondary | ICD-10-CM | POA: Insufficient documentation

## 2022-05-06 DIAGNOSIS — R002 Palpitations: Secondary | ICD-10-CM | POA: Insufficient documentation

## 2022-05-06 DIAGNOSIS — Z5321 Procedure and treatment not carried out due to patient leaving prior to being seen by health care provider: Secondary | ICD-10-CM | POA: Diagnosis not present

## 2022-05-06 DIAGNOSIS — R69 Illness, unspecified: Secondary | ICD-10-CM | POA: Diagnosis not present

## 2022-05-07 ENCOUNTER — Emergency Department (HOSPITAL_COMMUNITY): Payer: Medicare HMO

## 2022-05-07 ENCOUNTER — Other Ambulatory Visit: Payer: Self-pay

## 2022-05-07 ENCOUNTER — Encounter (HOSPITAL_COMMUNITY): Payer: Self-pay | Admitting: Emergency Medicine

## 2022-05-07 DIAGNOSIS — R002 Palpitations: Secondary | ICD-10-CM | POA: Diagnosis not present

## 2022-05-07 LAB — CBC WITH DIFFERENTIAL/PLATELET
Abs Immature Granulocytes: 0.01 10*3/uL (ref 0.00–0.07)
Basophils Absolute: 0 10*3/uL (ref 0.0–0.1)
Basophils Relative: 0 %
Eosinophils Absolute: 0.1 10*3/uL (ref 0.0–0.5)
Eosinophils Relative: 2 %
HCT: 34.9 % — ABNORMAL LOW (ref 36.0–46.0)
Hemoglobin: 10.7 g/dL — ABNORMAL LOW (ref 12.0–15.0)
Immature Granulocytes: 0 %
Lymphocytes Relative: 25 %
Lymphs Abs: 1.2 10*3/uL (ref 0.7–4.0)
MCH: 22.1 pg — ABNORMAL LOW (ref 26.0–34.0)
MCHC: 30.7 g/dL (ref 30.0–36.0)
MCV: 72.1 fL — ABNORMAL LOW (ref 80.0–100.0)
Monocytes Absolute: 0.4 10*3/uL (ref 0.1–1.0)
Monocytes Relative: 8 %
Neutro Abs: 3.1 10*3/uL (ref 1.7–7.7)
Neutrophils Relative %: 65 %
Platelets: 226 10*3/uL (ref 150–400)
RBC: 4.84 MIL/uL (ref 3.87–5.11)
RDW: 18.2 % — ABNORMAL HIGH (ref 11.5–15.5)
WBC: 4.8 10*3/uL (ref 4.0–10.5)
nRBC: 0 % (ref 0.0–0.2)

## 2022-05-07 LAB — TROPONIN I (HIGH SENSITIVITY): Troponin I (High Sensitivity): 2 ng/L (ref <18)

## 2022-05-07 LAB — BASIC METABOLIC PANEL
Anion gap: 9 (ref 5–15)
BUN: 19 mg/dL (ref 6–20)
CO2: 26 mmol/L (ref 22–32)
Calcium: 9.2 mg/dL (ref 8.9–10.3)
Chloride: 101 mmol/L (ref 98–111)
Creatinine, Ser: 1.15 mg/dL — ABNORMAL HIGH (ref 0.44–1.00)
GFR, Estimated: 57 mL/min — ABNORMAL LOW (ref >60)
Glucose, Bld: 115 mg/dL — ABNORMAL HIGH (ref 70–99)
Potassium: 3.2 mmol/L — ABNORMAL LOW (ref 3.5–5.1)
Sodium: 136 mmol/L (ref 135–145)

## 2022-05-07 LAB — MAGNESIUM: Magnesium: 2.1 mg/dL (ref 1.7–2.4)

## 2022-05-07 NOTE — Progress Notes (Signed)
   THERAPIST PROGRESS NOTE  Session Time: 30 minutes  Participation Level: Active  Behavioral Response: CasualAlertAnxious  Type of Therapy: Individual Therapy  Treatment Goals addressed: Creating initial treatment goals  ProgressTowards Goals: Initial  Interventions: CBT and Supportive  Summary:  Paula Leon is a 56 y.o. female who presents for the scheduled appointment oriented times five, appropriately dressed, and friendly. Client denied hallucinations and delusions. Client presents for initial appointment to engage in outpatient therapy services. Client presents following initial voluntary presentation to [facility] ED in June 2023 for worsening anxiety and panic attacks. Client reported she has begun medication management with [facility] psychiatrist. Client reported she has been having heart palpitations but noticed that may be contributed to eating chicken and not eating red meat anymore. Client reported she is constantly waking up out of her sleep with panic attacks. Client reported she has been taking methadone for the past 10 years. Client reported he has tapered down from 80mg  to 27mg . Client reported history of heroin use. Client reported she is engaged in and has a sponsor. Client reported being at home, certain smells and particular foods she eats trigger her anxiety. Client reported she is not sure why that is. Client reported she journals about her thoughts to see what may be affecting her. Client reported her anxiety makes her feel disoriented and she has been to the ED over 30 times because of it. Client reported she has been offered a job at but feels nervous about following through because she is not sure how she will act in such a public setting. Evidence of progress towards goal:  Client reported she is medication compliant 7 days out the week. Client reported 2 goals for therapy treatment.   Suicidal/Homicidal: Nowithout intent/plan  Therapist Response:   Therapist began the appointment making introduction and discussing confidentiality.  Therapist used CBT to ask the client about her current symptoms and medication compliance. Therapist used CBT to ask the client about triggers for her symptoms. Therapist used CBT to discuss psychoeducation and anxiety. Therapist used CBT to ask the client to identify her goals for treatment. Therapist used CBT ask the client to identify her progress with frequency of use with coping skills with continued practice in her daily activity.    Therapist addressed questions and concerns.    Plan: Return again in 5 weeks.  Diagnosis:   Generalized anxiety disorder with panic attacks  Collaboration of Care: Patient refused AEB none requested by the client.  Patient/Guardian was advised Release of Information must be obtained prior to any record release in order to collaborate their care with an outside provider. Patient/Guardian was advised if they have not already done so to contact the registration department to sign all necessary forms in order for [employer] to release information regarding their care.   Consent: Patient/Guardian gives verbal consent for treatment and assignment of benefits for services provided during this visit. Patient/Guardian expressed understanding and agreed to proceed.   [employer] Toniya Rozar, LCSW 05/05/2022

## 2022-05-07 NOTE — ED Provider Triage Note (Signed)
Emergency Medicine Provider Triage Evaluation Note  Paula Leon , a 56 y.o. female  was evaluated in triage.  Pt complains of palpitations and panic attacks.  States worsening x3 weeks, was started on zoloft but has not noticed any changes.  Denies chest pain.  Denies SI/HI.  Review of Systems  Positive: palpitations Negative: fever  Physical Exam  BP 127/78 (BP Location: Right Arm)   Pulse (!) 111   Temp 98.4 F (36.9 C) (Oral)   Resp 19   SpO2 97%  Gen:   Awake, no distress   Resp:  Normal effort  MSK:   Moves extremities without difficulty  Other:  Appears anxious, pacing in triage  Medical Decision Making  Medically screening exam initiated at 12:00 AM.  Appropriate orders placed.  Paula Leon was informed that the remainder of the evaluation will be completed by another provider, this initial triage assessment does not replace that evaluation, and the importance of remaining in the ED until their evaluation is complete.  Palpitations, anxiety.  Appears very anxious in triage, pacing around.  She is tachycardic but I suspect this is from her anxiety.  She denies active chest pain.  Will check EKG, labs, CXR.   Garlon Hatchet, PA-C 05/07/22 0001

## 2022-05-07 NOTE — ED Notes (Signed)
Called pt for vitals, no response.

## 2022-05-07 NOTE — ED Triage Notes (Signed)
Patient reports panic/anxiety attack with palpitations this evening , no chest pain / respirations unlabored .

## 2022-05-07 NOTE — ED Notes (Signed)
Pt called, no response

## 2022-05-09 DIAGNOSIS — R69 Illness, unspecified: Secondary | ICD-10-CM | POA: Diagnosis not present

## 2022-05-09 DIAGNOSIS — F1120 Opioid dependence, uncomplicated: Secondary | ICD-10-CM | POA: Diagnosis not present

## 2022-05-11 ENCOUNTER — Telehealth: Payer: Self-pay

## 2022-05-11 NOTE — Telephone Encounter (Signed)
     Patient  visit on 8/11  at [facility]   was for Panic Attack  Have you been able to follow up with your primary care physician?y  The patient was or was not able to obtain any needed medicine or equipment.y  Are there diet recommendations that you are having difficulty following?na  Patient expresses understanding of discharge instructions and education provided has no other needs at this time. yes     Lenard Forth Care Guide, Embedded Care Coordination [facility], Care Management  [phone] [address] [place], [place], [place] [address] Phone: [phone] Email: [email or web address]

## 2022-05-14 ENCOUNTER — Emergency Department (HOSPITAL_COMMUNITY)
Admission: EM | Admit: 2022-05-14 | Discharge: 2022-05-14 | Discharge status: Against Medical Advice | Payer: Medicare HMO | Attending: Emergency Medicine | Admitting: Emergency Medicine

## 2022-05-14 ENCOUNTER — Other Ambulatory Visit: Payer: Self-pay

## 2022-05-14 ENCOUNTER — Encounter (HOSPITAL_COMMUNITY): Payer: Self-pay | Admitting: Emergency Medicine

## 2022-05-14 DIAGNOSIS — R69 Illness, unspecified: Secondary | ICD-10-CM | POA: Diagnosis not present

## 2022-05-14 DIAGNOSIS — F419 Anxiety disorder, unspecified: Secondary | ICD-10-CM | POA: Insufficient documentation

## 2022-05-14 DIAGNOSIS — Z5321 Procedure and treatment not carried out due to patient leaving prior to being seen by health care provider: Secondary | ICD-10-CM | POA: Diagnosis not present

## 2022-05-14 NOTE — ED Triage Notes (Signed)
Patient here with complaint of anxiety that started earlier today, patient states she started zoloft three weeks ago and states she does not feel like it is helping yet. Patient is pacing around triage room but states she feels like anxiety is improving now.

## 2022-05-17 ENCOUNTER — Ambulatory Visit (INDEPENDENT_AMBULATORY_CARE_PROVIDER_SITE_OTHER): Payer: Medicare HMO | Admitting: Student in an Organized Health Care Education/Training Program

## 2022-05-17 VITALS — BP 116/79 | HR 81 | Resp 12 | Wt 207.6 lb

## 2022-05-17 DIAGNOSIS — F411 Generalized anxiety disorder: Secondary | ICD-10-CM

## 2022-05-17 DIAGNOSIS — F410 Panic disorder [episodic paroxysmal anxiety] without agoraphobia: Secondary | ICD-10-CM

## 2022-05-17 DIAGNOSIS — R69 Illness, unspecified: Secondary | ICD-10-CM | POA: Diagnosis not present

## 2022-05-17 MED ORDER — HYDROXYZINE HCL 25 MG PO TABS: 25.0000 mg | ORAL_TABLET | Freq: Three times a day (TID) | ORAL | 0 refills | Status: DC | PRN

## 2022-05-17 MED ORDER — SERTRALINE HCL 50 MG PO TABS: 50.0000 mg | ORAL_TABLET | Freq: Every day | ORAL | 2 refills | Status: DC

## 2022-05-17 NOTE — Progress Notes (Signed)
BH MD/PA/NP OP Progress Note  05/17/2022 5:20 PM Paula Leon  MRN:  [id number]  Chief Complaint:  Chief Complaint  Patient presents with   Follow-up   Anxiety   HPI: Paula Leon is a 56 yr old female who presents to establish care and for medication management.  PPHx is significant for Anxiety, Panic Attacks, and Polysubstance Abuse (Heroin, Cocaine, EtOH), currently on Methadone at [facility], no Suicide Attempts nor Self Injurious Behavior, and 1 prior hospitalization (2015 [address]).  Patient endorses compliance with the following psychotropic medications:  Zoloft 25 mg daily Hydroxyzine 25 mg 3 times daily as needed, patient endorses taking this medication total of 2 times since her last visit  On assessment today patient presents with her son who only moderately interacts throughout assessment.  Patient reports that she does feel the Zoloft has had some benefit.  Patient reports she continues to struggle with anxiety and does endorse having a panic attack in August when she presented to the hospital.  Patient reports that she is only taking the Vistaril when she has panic attacks and feels that it significantly improves and release her symptoms in those instances.  Patient reports that prior to starting medication she was having panic attacks multiple times a day and now she has only had approximately 3 in the last month.  Patient reports that despite this she constantly finds herself wondering "what if" and feels that her mind can still wander and ruminate on bad things.  Patient endorses a sense of feeling impending doom.  Patient reports that she has dysphoric mood and endorses that this is related to her being concerned about her anxiety and wanting to "get back to normal."  Patient reports she is sleeping approximately 8 hours a night and feels that her sleep is good, patient denies any anhedonia or change in her concentration overall.  Patient reports that she does on  occasion feel hopeless as she feels her anxiety makes it difficult for her to acquire a job.  Patient reports she also feels more energetic endorsing jitteriness and a feeling of inability to relax.  Patient reports that she feels that her appetite has changed, and patient's son interjects that he believes his mother has been eating healthier.  Patient reports that she is fearful of eating junk food because she feels that it will lead to a panic attack.  Patient reports she has changed her eating habits due to fear of panic attacks.  Patient reports that although she may be hungry in the early evening, she will not eat after 630 because she feels this will lead to a panic attack.  Patient reports that she is also walking more and she is back in school.  Discussion was had with patient about considering starting Zyprexa at 2.5 mg.  Although patient presents overweight, per chart review patient has lost approximately 20 pounds every month since June.  On documentation it appears that patient weighed 241 pounds in 02/2022, 222 pounds in 03/2022, and today in 04/2022 patient weighs 207 pounds.  Patient suddenly becomes severely anxious bouncing her leg and playing with her fingers.  Patient reports that she is "scared."  Ultimately patient is able to communicate that she feels that changing medication overwhelms her as she feels that she is sensitive to medications.  Patient is agreeable to increasing her current dose of Zoloft while continue hydroxyzine as is.  Patient does endorse that she will attempt to get a PCP appointment for further eval regarding  weight loss.  Visit Diagnosis:    ICD-10-CM   1. Panic disorder  F41.0     2. GAD (generalized anxiety disorder)  F41.1 sertraline (ZOLOFT) 50 MG tablet    hydrOXYzine (ATARAX) 25 MG tablet      Past Psychiatric History: Anxiety, Panic Attacks, and Polysubstance Abuse (Heroin, Cocaine, EtOH), currently on Methadone at [facility], no Suicide Attempts nor Self  Injurious Behavior, and 1 prior hospitalization (2015 [place]).  Past Medical History:  Past Medical History:  Diagnosis Date   Anemia    IDA   Depression    Hernia of abdominal wall    History of drug abuse ([place])    has not used in ten years   Substance abuse ([place])    2012 QUIT Heroin   Transfusion history    due to anemia with hb 4.0    Past Surgical History:  Procedure Laterality Date   INSERTION OF MESH N/A 04/19/2019   Procedure: INSERTION OF MESH;  Surgeon: Armandina Gemma, MD;  Location: [facility];  Service: General;  Laterality: N/A;   SALPINGECTOMY Left    due to ruptured ectopic   VENTRAL HERNIA REPAIR N/A 04/19/2019   Procedure: VENTRAL HERNIA REPAIR;  Surgeon: Armandina Gemma, MD;  Location: [facility];  Service: General;  Laterality: N/A;    Family Psychiatric History: Multiple Maternal Members- EtOH abuse Maternal Aunt- Anxiety  Family History:  Family History  Problem Relation Age of Onset   Cancer Mother    Cancer Father    Colon cancer Neg Hx    Colon polyps Neg Hx    Esophageal cancer Neg Hx    Rectal cancer Neg Hx    Stomach cancer Neg Hx     Social History:  Social History   Socioeconomic History   Marital status: Single    Spouse name: Not on file   Number of children: Not on file   Years of education: Not on file   Highest education level: Not on file  Occupational History   Not on file  Tobacco Use   Smoking status: Every Day    Packs/day: 0.25    Types: Cigarettes   Smokeless tobacco: Never   Tobacco comments:    4-5  cigarettes per day.   Wants to stop  Vaping Use   Vaping Use: Never used  Substance and Sexual Activity   Alcohol use: Not Currently    Comment: 2012   Drug use: Not Currently    Types: Heroin, Marijuana    Comment: last time used drugs was 2012   Sexual activity: Not Currently    Birth control/protection: Surgical  Other Topics Concern   Not on file  Social History  Narrative   Not on file   Social Determinants of Health   Financial Resource Strain: Medium Risk (02/25/2022)   Overall Financial Resource Strain (CARDIA)    Difficulty of Paying Living Expenses: Somewhat hard  Food Insecurity: Food Insecurity Present (02/25/2022)   Hunger Vital Sign    Worried About [place] in the Last Year: Often true    Ran Out of Food in the Last Year: Often true  Transportation Needs: No Transportation Needs (02/25/2022)   PRAPARE - Hydrologist (Medical): No    Lack of Transportation (Non-Medical): No  Physical Activity: Insufficiently Active (02/25/2022)   Exercise Vital Sign    Days of Exercise per Week: 7 days    Minutes of  Exercise per Session: 20 min  Stress: Stress Concern Present (02/25/2022)   [place]    Feeling of Stress : Very much  Social Connections: Moderately Isolated (02/25/2022)   Social Connection and Isolation Panel [NHANES]    Frequency of Communication with Friends and Family: More than three times a week    Frequency of Social Gatherings with Friends and Family: More than three times a week    Attends Religious Services: More than 4 times per year    Active Member of [employer] or Organizations: No    Attends Archivist Meetings: Never    Marital Status: Never married    Allergies:  Allergies  Allergen Reactions   Penicillins Swelling    Throat swelling    Metabolic Disorder Labs: Lab Results  Component Value Date   HGBA1C 5.4 02/27/2022   MPG 108.28 02/27/2022   No results found for: "PROLACTIN" Lab Results  Component Value Date   CHOL 137 02/27/2022   TRIG 77 02/27/2022   HDL 46 02/27/2022   CHOLHDL 3.0 02/27/2022   VLDL 15 02/27/2022   LDLCALC 76 02/27/2022   Lab Results  Component Value Date   TSH 1.226 02/27/2022   TSH 1.544 02/23/2022    Therapeutic Level Labs: No results found for: "LITHIUM" No results  found for: "VALPROATE" No results found for: "CBMZ"  Current Medications: Current Outpatient Medications  Medication Sig Dispense Refill   sertraline (ZOLOFT) 50 MG tablet Take 1 tablet (50 mg total) by mouth daily. 30 tablet 2   bisacodyl (DULCOLAX) 10 MG suppository Place 1 suppository (10 mg total) rectally as needed for moderate constipation. 5 suppository 0   diclofenac Sodium (VOLTAREN) 1 % GEL Apply 4 g topically 4 (four) times daily. (Patient taking differently: Apply 4 g topically 4 (four) times daily as needed (joint pain).) 150 g 1   docusate sodium (COLACE) 100 MG capsule Take 1 capsule (100 mg total) by mouth every 12 (twelve) hours. 60 capsule 0   ferrous sulfate 325 (65 FE) MG tablet Take 1 tablet (325 mg total) by mouth daily with breakfast. 90 tablet 2   hydrOXYzine (ATARAX) 25 MG tablet Take 1 tablet (25 mg total) by mouth every 8 (eight) hours as needed. 60 tablet 0   methadone (DOLOPHINE) 10 MG/5ML solution Take 35 mg by mouth every morning.     senna (SENOKOT) 8.6 MG TABS tablet Take 2 tablets (17.2 mg total) by mouth daily. 120 tablet 0   No current facility-administered medications for this visit.     Musculoskeletal: Strength & Muscle Tone: within normal limits Gait & Station: normal Patient leans: Front  Psychiatric Specialty Exam: Review of Systems  Psychiatric/Behavioral:  Positive for dysphoric mood. Negative for decreased concentration, hallucinations, self-injury, sleep disturbance and suicidal ideas. The patient is nervous/anxious.     Blood pressure 116/79, pulse 81, resp. rate 12, weight 207 lb 9.6 oz (94.2 kg), SpO2 100 %.Body mass index is 35.63 kg/m.  General Appearance: Casual  Eye Contact:  Fair  Speech:  Clear and Coherent  Volume:  Normal  Mood:  Anxious  Affect:  Congruent  Thought Process:  Goal Directed  Orientation:  Full (Time, Place, and Person)  Thought Content: Rumination   Suicidal Thoughts:  No  Homicidal Thoughts:  No   Memory:  Immediate;   Fair Recent;   Fair  Judgement:  Fair  Insight:  Shallow  Psychomotor Activity:  Increased  Concentration:  Concentration:  Fair  Recall:  NA  Fund of Knowledge: Fair  Language: Fair  Akathisia:    Handed:    AIMS (if indicated): not done  Assets:  Desire for Improvement Housing Resilience Social Support  ADL's:  Intact  Cognition: WNL  Sleep:  Good   Screenings: GAD-7    Flowsheet Row Clinical Support from 05/17/2022 in [facility] Counselor from 04/11/2022 in [facility] Office Visit from 04/07/2022 in [facility] Office Visit from 02/25/2022 in [facility] Office Visit from 10/28/2021 in [facility]  Total GAD-7 Score 17 21 18 20 10       PHQ2-9    Flowsheet Row Clinical Support from 05/17/2022 in [facility] Most recent reading at 05/17/2022  3:42 PM Counselor from 04/11/2022 in [facility] Most recent reading at 04/11/2022  9:07 AM Office Visit from 04/07/2022 in [facility] Most recent reading at 04/07/2022  4:20 PM Office Visit from 02/25/2022 in [facility] Most recent reading at 02/25/2022 11:08 AM Office Visit from 02/25/2022 in [facility] Most recent reading at 02/25/2022 10:46 AM  PHQ-2 Total Score 5 0 0 0 0  PHQ-9 Total Score 7 -- -- 0 --      Flowsheet Row Office Visit from 05/05/2022 in [facility] Most recent reading at 05/07/2022  2:18 PM ED from 05/06/2022 in [facility] EMERGENCY DEPARTMENT Most recent reading at 05/07/2022 12:02 AM ED from 04/16/2022 in [facility] EMERGENCY DEPARTMENT Most recent reading at 04/16/2022  5:52 PM  C-SSRS RISK CATEGORY No Risk No Risk No Risk        Assessment and Plan: Shandon Matson is a 56 yr old female  who presents to establish care and for medication management.  PPHx is significant for Anxiety, Panic Attacks, and Polysubstance Abuse (Heroin, Cocaine, EtOH), currently on Methadone at [facility], no Suicide Attempts nor Self Injurious Behavior, and 1 prior hospitalization (2015 [birth year]15-1984). On assessment today patient continues to have severe anxiety however she does appear to have had some improvement with a small dose of Zoloft.  Patient will likely continue to benefit with increase in medication dosage.  Patient's weight loss is concerning and has some patterns of Orthorexia, but may also be attributed to patient's panic disorder and ruminative thoughts associating where she has a tendency to dissociate her actions and physical feelings with anxiety.  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  There was consideration of starting patient on Zyprexa to target these ruminative thoughts and appetite behaviors however, patient began to decompensate during this conversation.  At this time patient remains classified overweight and patient has not had a workup medically for weight loss.  Patient endorsed that she would follow-up with her PCP for evaluation.  However based on today's conversation, patient does meet criteria for panic disorder as she is living in fear of having panic attacks and change in her behavior due to this.  GAD Panic disorder -Increase Zoloft from 25 mg to 50 mg daily - Continue Atarax 25 mg 3 times daily as needed, patient uses as a reactive measure for her less frequent panic attacks -Resources provided to patient, that may offer therapy more frequently than monthly  Significant  weight loss (R/O Orthorexia vs medical illness) - Patient to follow-up with PCP  Collaboration of Care: Collaboration of Care:   Patient/Guardian was advised Release of  Information must be obtained prior to any record release in order to collaborate their care with an outside provider. Patient/Guardian was advised if they have not already done so to contact the registration department to sign all necessary forms in order for [place] to release information regarding their care.   Consent: Patient/Guardian gives verbal consent for treatment and assignment of benefits for services provided during this visit. Patient/Guardian expressed understanding and agreed to proceed.   PGY-3 Freida Busman, MD 05/17/2022, 5:20 PM

## 2022-05-17 NOTE — Patient Instructions (Signed)
Please contact one of the following facilities to therapy services:   [facility]  [address] [place], [place] [address] [phone]  [facility]  [address]  [place], [place] [address] [phone]  [facility]  [address] [place], [place] [address] [phone]  [facility]  [address],  [place], [place] [address] [phone]

## 2022-05-18 ENCOUNTER — Telehealth: Payer: Self-pay

## 2022-05-18 NOTE — Telephone Encounter (Signed)
     Patient  visit on 05/14/2022  at [facility] was for Anxiety  Have you been able to follow up with your primary care physician? Patient stated she needed help with finding a Mental Health Therapist. I have mailed her a list   The patient was or was not able to obtain any needed medicine or equipment.yes  Are there diet recommendations that you are having difficulty following?na  Patient expresses understanding of discharge instructions and education provided has no other needs at this time. yes     Lenard Forth Care Guide, Embedded Care Coordination [facility], Care Management  [phone] [address] [place], [place], [place] [address] Phone: [phone] Email: [email or web address]

## 2022-05-23 DIAGNOSIS — F1120 Opioid dependence, uncomplicated: Secondary | ICD-10-CM | POA: Diagnosis not present

## 2022-05-23 DIAGNOSIS — R69 Illness, unspecified: Secondary | ICD-10-CM | POA: Diagnosis not present

## 2022-06-17 ENCOUNTER — Encounter (HOSPITAL_COMMUNITY): Payer: Medicare Other | Admitting: Student in an Organized Health Care Education/Training Program

## 2022-06-17 ENCOUNTER — Encounter (HOSPITAL_COMMUNITY): Payer: Self-pay | Admitting: Student in an Organized Health Care Education/Training Program

## 2022-06-17 ENCOUNTER — Ambulatory Visit (INDEPENDENT_AMBULATORY_CARE_PROVIDER_SITE_OTHER): Payer: Medicare Other | Admitting: Student in an Organized Health Care Education/Training Program

## 2022-06-17 VITALS — BP 130/85 | HR 85 | Ht 64.0 in | Wt 206.4 lb

## 2022-06-17 DIAGNOSIS — F410 Panic disorder [episodic paroxysmal anxiety] without agoraphobia: Secondary | ICD-10-CM

## 2022-06-17 DIAGNOSIS — F411 Generalized anxiety disorder: Secondary | ICD-10-CM

## 2022-06-17 MED ORDER — HYDROXYZINE HCL 25 MG PO TABS: 25.0000 mg | ORAL_TABLET | Freq: Three times a day (TID) | ORAL | 0 refills | Status: DC | PRN

## 2022-06-17 MED ORDER — SERTRALINE HCL 50 MG PO TABS: 75.0000 mg | ORAL_TABLET | Freq: Every day | ORAL | 1 refills | Status: DC

## 2022-06-17 NOTE — Progress Notes (Signed)
BH MD/PA/NP OP Progress Note  06/17/2022 9:48 AM Paula Leon  MRN:  [id number]  Chief Complaint:  Chief Complaint  Patient presents with   Follow-up   Anxiety   HPI:  Paula Leon is a 56 yr old female who presents for follow up and for medication management.  PPHx is significant for Anxiety, Panic Attacks, and Polysubstance Abuse (Heroin, Cocaine, EtOH), currently on Methadone at [facility], no Suicide Attempts nor Self Injurious Behavior, and 1 prior hospitalization (2015 [place]).   She reports that she has been doing better since starting the medication.  She reports that her anxiety level has decreased and she is able to do more of the things she wants to.  She reports that her sleep has improved.  She reports that her appetite has improved and she is even been trying new foods.  She reports that she does notice an increase in her anxiety in the evenings.  Discussed with her that since she is still having significant anxiety it would be worth trialing an increase in her Zoloft.  She was agreeable to this.  She reports no SI, HI, or AVH.  She will return for follow-up in approximately 6 weeks.    Visit Diagnosis:    ICD-10-CM   1. Generalized anxiety disorder with panic attacks  F41.1 hydrOXYzine (ATARAX) 25 MG tablet   F41.0 sertraline (ZOLOFT) 50 MG tablet      Past Psychiatric History: Anxiety, Panic Attacks, and Polysubstance Abuse (Heroin, Cocaine, EtOH), currently on Methadone at [facility], no Suicide Attempts nor Self Injurious Behavior, and 1 prior hospitalization (2015 [place]).  Past Medical History:  Past Medical History:  Diagnosis Date   Anemia    IDA   Depression    Hernia of abdominal wall    History of drug abuse ([place])    has not used in ten years   Substance abuse ([place])    2012 QUIT Heroin   Transfusion history    due to anemia with hb 4.0    Past Surgical History:  Procedure Laterality Date   INSERTION OF MESH N/A  04/19/2019   Procedure: INSERTION OF MESH;  Surgeon: Armandina Gemma, MD;  Location: [facility];  Service: General;  Laterality: N/A;   SALPINGECTOMY Left    due to ruptured ectopic   VENTRAL HERNIA REPAIR N/A 04/19/2019   Procedure: VENTRAL HERNIA REPAIR;  Surgeon: Armandina Gemma, MD;  Location: [facility];  Service: General;  Laterality: N/A;    Family Psychiatric History: Multiple Maternal Members- EtOH abuse Maternal Aunt- Anxiety  Family History:  Family History  Problem Relation Age of Onset   Cancer Mother    Cancer Father    Colon cancer Neg Hx    Colon polyps Neg Hx    Esophageal cancer Neg Hx    Rectal cancer Neg Hx    Stomach cancer Neg Hx     Social History:  Social History   Socioeconomic History   Marital status: Single    Spouse name: Not on file   Number of children: Not on file   Years of education: Not on file   Highest education level: Not on file  Occupational History   Not on file  Tobacco Use   Smoking status: Every Day    Packs/day: 0.25    Types: Cigarettes   Smokeless tobacco: Never   Tobacco comments:    4-5  cigarettes per day.   Wants to stop  Vaping Use  Vaping Use: Never used  Substance and Sexual Activity   Alcohol use: Not Currently    Comment: 2012   Drug use: Not Currently    Types: Heroin, Marijuana    Comment: last time used drugs was 2012   Sexual activity: Not Currently    Birth control/protection: Surgical  Other Topics Concern   Not on file  Social History Narrative   Not on file   Social Determinants of Health   Financial Resource Strain: Medium Risk (02/25/2022)   Overall Financial Resource Strain (CARDIA)    Difficulty of Paying Living Expenses: Somewhat hard  Food Insecurity: Food Insecurity Present (02/25/2022)   Hunger Vital Sign    Worried About [place] in the Last Year: Often true    Ran Out of Food in the Last Year: Often true  Transportation Needs: No Transportation Needs  (02/25/2022)   PRAPARE - Hydrologist (Medical): No    Lack of Transportation (Non-Medical): No  Physical Activity: Insufficiently Active (02/25/2022)   Exercise Vital Sign    Days of Exercise per Week: 7 days    Minutes of Exercise per Session: 20 min  Stress: Stress Concern Present (02/25/2022)   [place]    Feeling of Stress : Very much  Social Connections: Moderately Isolated (02/25/2022)   Social Connection and Isolation Panel [NHANES]    Frequency of Communication with Friends and Family: More than three times a week    Frequency of Social Gatherings with Friends and Family: More than three times a week    Attends Religious Services: More than 4 times per year    Active Member of [employer] or Organizations: No    Attends Archivist Meetings: Never    Marital Status: Never married    Allergies:  Allergies  Allergen Reactions   Penicillins Swelling    Throat swelling    Metabolic Disorder Labs: Lab Results  Component Value Date   HGBA1C 5.4 02/27/2022   MPG 108.28 02/27/2022   No results found for: "PROLACTIN" Lab Results  Component Value Date   CHOL 137 02/27/2022   TRIG 77 02/27/2022   HDL 46 02/27/2022   CHOLHDL 3.0 02/27/2022   VLDL 15 02/27/2022   LDLCALC 76 02/27/2022   Lab Results  Component Value Date   TSH 1.226 02/27/2022   TSH 1.544 02/23/2022    Therapeutic Level Labs: No results found for: "LITHIUM" No results found for: "VALPROATE" No results found for: "CBMZ"  Current Medications: Current Outpatient Medications  Medication Sig Dispense Refill   bisacodyl (DULCOLAX) 10 MG suppository Place 1 suppository (10 mg total) rectally as needed for moderate constipation. 5 suppository 0   diclofenac Sodium (VOLTAREN) 1 % GEL Apply 4 g topically 4 (four) times daily. (Patient taking differently: Apply 4 g topically 4 (four) times daily as needed (joint  pain).) 150 g 1   docusate sodium (COLACE) 100 MG capsule Take 1 capsule (100 mg total) by mouth every 12 (twelve) hours. 60 capsule 0   ferrous sulfate 325 (65 FE) MG tablet Take 1 tablet (325 mg total) by mouth daily with breakfast. 90 tablet 2   hydrOXYzine (ATARAX) 25 MG tablet Take 1 tablet (25 mg total) by mouth every 8 (eight) hours as needed. 60 tablet 0   methadone (DOLOPHINE) 10 MG/5ML solution Take 35 mg by mouth every morning.     senna (SENOKOT) 8.6 MG TABS tablet Take 2  tablets (17.2 mg total) by mouth daily. 120 tablet 0   sertraline (ZOLOFT) 50 MG tablet Take 1.5 tablets (75 mg total) by mouth daily. 45 tablet 1   No current facility-administered medications for this visit.     Musculoskeletal: Strength & Muscle Tone: within normal limits Gait & Station: normal Patient leans: N/A  Psychiatric Specialty Exam: Review of Systems  Respiratory:  Negative for shortness of breath.   Cardiovascular:  Negative for chest pain.  Gastrointestinal:  Negative for abdominal pain, constipation, diarrhea, nausea and vomiting.  Neurological:  Negative for dizziness, weakness and headaches.  Psychiatric/Behavioral:  Negative for dysphoric mood, hallucinations, self-injury, sleep disturbance and suicidal ideas. The patient is nervous/anxious.     Blood pressure 130/85, pulse 85, height 5\' 4"  (1.626 m), weight 206 lb 6.4 oz (93.6 kg).Body mass index is 35.43 kg/m.  General Appearance: Casual and Fairly Groomed  Eye Contact:  Good  Speech:  Clear and Coherent and Normal Rate  Volume:  Normal  Mood:  Anxious  Affect:  Congruent  Thought Process:  Coherent and Goal Directed  Orientation:  Full (Time, Place, and Person)  Thought Content: WDL and Logical   Suicidal Thoughts:  No  Homicidal Thoughts:  No  Memory:  Immediate;   Good Recent;   Good  Judgement:  Fair  Insight:  Fair  Psychomotor Activity:  Normal  Concentration:  Concentration: Good and Attention Span: Good  Recall:   Good  Fund of Knowledge: Good  Language: Good  Akathisia:  Negative  Handed:  Right  AIMS (if indicated): not done  Assets:  Communication Skills Desire for Improvement Housing Resilience Social Support  ADL's:  Intact  Cognition: WNL  Sleep:  Poor   Screenings: GAD-7    Flowsheet Row Clinical Support from 05/17/2022 in [facility] Counselor from 04/11/2022 in [facility] Office Visit from 04/07/2022 in [place] Office Visit from 02/25/2022 in [place] Office Visit from 10/28/2021 in [place]  Total GAD-7 Score 17 21 18 20 10       PHQ2-9    [place] from 05/17/2022 in [facility] Most recent reading at 05/17/2022  3:42 PM Counselor from 04/11/2022 in [facility] Most recent reading at 04/11/2022  9:07 AM Office Visit from 04/07/2022 in [place] Most recent reading at 04/07/2022  4:20 PM Office Visit from 02/25/2022 in [place] Most recent reading at 02/25/2022 11:08 AM Office Visit from 02/25/2022 in [place] Most recent reading at 02/25/2022 10:46 AM  PHQ-2 Total Score 5 0 0 0 0  PHQ-9 Total Score 7 -- -- 0 --      [place] Office Visit from 05/05/2022 in [facility] Most recent reading at 05/07/2022  2:18 PM ED from 05/06/2022 in [place] Most recent reading at 05/07/2022 12:02 AM ED from 04/16/2022 in [place] Most recent reading at 04/16/2022  5:52 PM  C-SSRS RISK CATEGORY No Risk No Risk No Risk        Assessment and Plan:  Elexia Friedt is a 56 yr old female who presents for follow up and for medication management.  PPHx is significant for Anxiety, Panic Attacks, and Polysubstance  Abuse (Heroin, Cocaine, EtOH), currently on Methadone at [facility], no Suicide Attempts nor Self  Injurious Behavior, and 1 prior hospitalization (2015 [place] [employer]).   Estefany has had improvement with the Zoloft and Hydroxyzine.  Since she still has a high level of anxiety and increase in the evening we will increase her Zoloft.  She will return for follow up in approximately 6 weeks.   GAD with Panic Attacks: -Increase Zoloft to 75 mg daily. 45 (50 mg) tablets with 1 refill sent. -Continue Atarax 25 mg TID PRN. 60 tablets with 0 refills.    Collaboration of Care:   Patient/Guardian was advised Release of Information must be obtained prior to any record release in order to collaborate their care with an outside provider. Patient/Guardian was advised if they have not already done so to contact the registration department to sign all necessary forms in order for [place] to release information regarding their care.   Consent: Patient/Guardian gives verbal consent for treatment and assignment of benefits for services provided during this visit. Patient/Guardian expressed understanding and agreed to proceed.    Briant Cedar, MD 06/17/2022, 9:48 AM

## 2022-06-23 ENCOUNTER — Ambulatory Visit (HOSPITAL_COMMUNITY): Payer: Medicare Other | Admitting: Clinical

## 2022-07-29 ENCOUNTER — Encounter (HOSPITAL_COMMUNITY): Payer: Medicare Other | Admitting: Student in an Organized Health Care Education/Training Program

## 2022-08-05 ENCOUNTER — Encounter (HOSPITAL_COMMUNITY): Payer: Medicare Other | Admitting: Student in an Organized Health Care Education/Training Program

## 2022-08-17 ENCOUNTER — Ambulatory Visit (INDEPENDENT_AMBULATORY_CARE_PROVIDER_SITE_OTHER): Payer: Medicare Other | Admitting: Student in an Organized Health Care Education/Training Program

## 2022-08-17 VITALS — BP 133/89 | HR 87 | Ht 64.0 in | Wt 202.4 lb

## 2022-08-17 DIAGNOSIS — F410 Panic disorder [episodic paroxysmal anxiety] without agoraphobia: Secondary | ICD-10-CM | POA: Diagnosis not present

## 2022-08-17 DIAGNOSIS — F411 Generalized anxiety disorder: Secondary | ICD-10-CM

## 2022-08-17 MED ORDER — SERTRALINE HCL 50 MG PO TABS: 50.0000 mg | ORAL_TABLET | Freq: Every day | ORAL | 1 refills | Status: DC

## 2022-08-17 MED ORDER — SERTRALINE HCL 50 MG PO TABS: 75.0000 mg | ORAL_TABLET | Freq: Every day | ORAL | 1 refills | Status: DC

## 2022-08-17 MED ORDER — HYDROXYZINE HCL 10 MG PO TABS: 10.0000 mg | ORAL_TABLET | Freq: Three times a day (TID) | ORAL | 0 refills | Status: DC | PRN

## 2022-08-17 NOTE — Progress Notes (Signed)
BH MD/PA/NP OP Progress Note  08/17/2022 10:03 AM Paula Leon  MRN:  [id number]  Chief Complaint:  Chief Complaint  Patient presents with   Follow-up   Medication Refill   Anxiety   HPI:  Paula Leon is a 56 yr old female who presents for follow up and for medication management.  PPHx is significant for Anxiety, Panic Attacks, and Polysubstance Abuse (Heroin, Cocaine, EtOH), currently on Methadone at [facility], no Suicide Attempts nor Self Injurious Behavior, and 1 prior hospitalization (2015 [address]).   She reports that she has continued to do well since her last appointment.  She reports that the 75 mg of Zoloft made her feel different so she has just been taking 50 mg.  She reports that this has been doing good for her and her anxiety has been well-controlled.  She reports that she had one bad day yesterday which she attributes to doing too much but otherwise reports the entire month her anxiety has been low.  She reports that she has been splitting her hydroxyzine's in half and so asked to take a lower dose.  She reports she has been keeping herself busy exercising more and working.  She reports she is continuing to maintain her sobriety of 11 years and goes to 2 AA meetings a day, continues to work the steps, and has a Marketing executive.  Discussed with her that other than these decreases we would not make any other changes due to her being stable and doing well and she was agreeable to this.  She reports no SI, HI, or AVH.  She reports her sleep is good.  She reports her appetite is doing good.  She reports no other concerns at present.  She will return for follow-up in approximately 2 months.   Visit Diagnosis:    ICD-10-CM   1. Generalized anxiety disorder with panic attacks  F41.1 hydrOXYzine (ATARAX) 10 MG tablet   F41.0 sertraline (ZOLOFT) 50 MG tablet    DISCONTINUED: sertraline (ZOLOFT) 50 MG tablet      Past Psychiatric History: Anxiety, Panic Attacks, and Polysubstance  Abuse (Heroin, Cocaine, EtOH), currently on Methadone at [facility], no Suicide Attempts nor Self Injurious Behavior, and 1 prior hospitalization (2015 [address]).   Past Medical History:  Past Medical History:  Diagnosis Date   Anemia    IDA   Depression    Hernia of abdominal wall    History of drug abuse ([facility])    has not used in ten years   Substance abuse ([facility])    2012 QUIT Heroin   Transfusion history    due to anemia with hb 4.0    Past Surgical History:  Procedure Laterality Date   INSERTION OF MESH N/A 04/19/2019   Procedure: INSERTION OF MESH;  Surgeon: Darnell Level, MD;  Location: [facility];  Service: General;  Laterality: N/A;   SALPINGECTOMY Left    due to ruptured ectopic   VENTRAL HERNIA REPAIR N/A 04/19/2019   Procedure: VENTRAL HERNIA REPAIR;  Surgeon: Darnell Level, MD;  Location: [facility];  Service: General;  Laterality: N/A;    Family Psychiatric History: Multiple Maternal Members- EtOH abuse Maternal Aunt- Anxiety  Family History:  Family History  Problem Relation Age of Onset   Cancer Mother    Cancer Father    Colon cancer Neg Hx    Colon polyps Neg Hx    Esophageal cancer Neg Hx    Rectal cancer Neg Hx  Stomach cancer Neg Hx     Social History:  Social History   Socioeconomic History   Marital status: Single    Spouse name: Not on file   Number of children: Not on file   Years of education: Not on file   Highest education level: Not on file  Occupational History   Not on file  Tobacco Use   Smoking status: Every Day    Packs/day: 0.25    Types: Cigarettes   Smokeless tobacco: Never   Tobacco comments:    4-5  cigarettes per day.   Wants to stop  Vaping Use   Vaping Use: Never used  Substance and Sexual Activity   Alcohol use: Not Currently    Comment: 2012   Drug use: Not Currently    Types: Heroin, Marijuana    Comment: last time used drugs was 2012   Sexual activity: Not  Currently    Birth control/protection: Surgical  Other Topics Concern   Not on file  Social History Narrative   Not on file   Social Determinants of Health   Financial Resource Strain: Medium Risk (02/25/2022)   Overall Financial Resource Strain (CARDIA)    Difficulty of Paying Living Expenses: Somewhat hard  Food Insecurity: Food Insecurity Present (02/25/2022)   Hunger Vital Sign    Worried About [place] in the Last Year: Often true    Ran Out of Food in the Last Year: Often true  Transportation Needs: No Transportation Needs (02/25/2022)   PRAPARE - Hydrologist (Medical): No    Lack of Transportation (Non-Medical): No  Physical Activity: Insufficiently Active (02/25/2022)   Exercise Vital Sign    Days of Exercise per Week: 7 days    Minutes of Exercise per Session: 20 min  Stress: Stress Concern Present (02/25/2022)   [place]    Feeling of Stress : Very much  Social Connections: Moderately Isolated (02/25/2022)   Social Connection and Isolation Panel [NHANES]    Frequency of Communication with Friends and Family: More than three times a week    Frequency of Social Gatherings with Friends and Family: More than three times a week    Attends Religious Services: More than 4 times per year    Active Member of [employer] or Organizations: No    Attends Archivist Meetings: Never    Marital Status: Never married    Allergies:  Allergies  Allergen Reactions   Penicillins Swelling    Throat swelling    Metabolic Disorder Labs: Lab Results  Component Value Date   HGBA1C 5.4 02/27/2022   MPG 108.28 02/27/2022   No results found for: "PROLACTIN" Lab Results  Component Value Date   CHOL 137 02/27/2022   TRIG 77 02/27/2022   HDL 46 02/27/2022   CHOLHDL 3.0 02/27/2022   VLDL 15 02/27/2022   LDLCALC 76 02/27/2022   Lab Results  Component Value Date   TSH 1.226  02/27/2022   TSH 1.544 02/23/2022    Therapeutic Level Labs: No results found for: "LITHIUM" No results found for: "VALPROATE" No results found for: "CBMZ"  Current Medications: Current Outpatient Medications  Medication Sig Dispense Refill   bisacodyl (DULCOLAX) 10 MG suppository Place 1 suppository (10 mg total) rectally as needed for moderate constipation. 5 suppository 0   diclofenac Sodium (VOLTAREN) 1 % GEL Apply 4 g topically 4 (four) times daily. (Patient taking differently: Apply 4 g  topically 4 (four) times daily as needed (joint pain).) 150 g 1   docusate sodium (COLACE) 100 MG capsule Take 1 capsule (100 mg total) by mouth every 12 (twelve) hours. 60 capsule 0   ferrous sulfate 325 (65 FE) MG tablet Take 1 tablet (325 mg total) by mouth daily with breakfast. 90 tablet 2   hydrOXYzine (ATARAX) 10 MG tablet Take 1 tablet (10 mg total) by mouth every 8 (eight) hours as needed. 60 tablet 0   methadone (DOLOPHINE) 10 MG/5ML solution Take 35 mg by mouth every morning.     senna (SENOKOT) 8.6 MG TABS tablet Take 2 tablets (17.2 mg total) by mouth daily. 120 tablet 0   sertraline (ZOLOFT) 50 MG tablet Take 1 tablet (50 mg total) by mouth daily. 30 tablet 1   No current facility-administered medications for this visit.     Musculoskeletal: Strength & Muscle Tone: within normal limits Gait & Station: normal Patient leans: N/A  Psychiatric Specialty Exam: Review of Systems  Respiratory:  Negative for shortness of breath.   Cardiovascular:  Negative for chest pain.  Gastrointestinal:  Negative for abdominal pain, constipation, diarrhea, nausea and vomiting.  Neurological:  Negative for dizziness, weakness and headaches.  Psychiatric/Behavioral:  Negative for dysphoric mood, hallucinations, sleep disturbance and suicidal ideas. The patient is not nervous/anxious.     Blood pressure 133/89, pulse 87, height 5\' 4"  (1.626 m), weight 202 lb 6.4 oz (91.8 kg), SpO2 99 %.Body mass index  is 34.74 kg/m.  General Appearance: Casual and Fairly Groomed  Eye Contact:  Good  Speech:  Clear and Coherent and Normal Rate  Volume:  Normal  Mood:   "good"  Affect:  Appropriate and Congruent  Thought Process:  Coherent and Goal Directed  Orientation:  Full (Time, Place, and Person)  Thought Content: WDL and Logical   Suicidal Thoughts:  No  Homicidal Thoughts:  No  Memory:  Immediate;   Good Recent;   Good  Judgement:  Good  Insight:  Good  Psychomotor Activity:  Normal  Concentration:  Concentration: Good and Attention Span: Good  Recall:  Good  Fund of Knowledge: Good  Language: Good  Akathisia:  Negative  Handed:  Right  AIMS (if indicated): not done  Assets:  Communication Skills Desire for Improvement Resilience Social Support  ADL's:  Intact  Cognition: WNL  Sleep:  Good   Screenings: GAD-7    Flowsheet Row Clinical Support from 05/17/2022 in [facility] Counselor from 04/11/2022 in [facility] Office Visit from 04/07/2022 in [place] Office Visit from 02/25/2022 in [place] Office Visit from 10/28/2021 in [place]  Total GAD-7 Score 17 21 18 20 10       PHQ2-9    [place] from 05/17/2022 in [facility] Most recent reading at 05/17/2022  3:42 PM Counselor from 04/11/2022 in [facility] Most recent reading at 04/11/2022  9:07 AM Office Visit from 04/07/2022 in [place] Most recent reading at 04/07/2022  4:20 PM Office Visit from 02/25/2022 in [place] Most recent reading at 02/25/2022 11:08 AM Office Visit from 02/25/2022 in [place] Most recent reading at 02/25/2022 10:46 AM  PHQ-2 Total Score 5 0 0 0 0  PHQ-9 Total Score 7 -- -- 0 --      [place] Office Visit from  05/05/2022 in [facility] Most recent reading at 05/07/2022  2:18 PM ED from 05/06/2022 in [place] Most recent reading at 05/07/2022 12:02 AM ED from 04/16/2022 in [place] Most recent reading at 04/16/2022  5:52 PM  C-SSRS RISK CATEGORY No Risk No Risk No Risk        Assessment and Plan:  Kenda Nakasone is a 56 yr old female who presents for follow up and for medication management.  PPHx is significant for Anxiety, Panic Attacks, and Polysubstance Abuse (Heroin, Cocaine, EtOH), currently on [facility] at [facility], no Suicide Attempts nor Self Injurious Behavior, and 1 prior hospitalization (2015 [place]).    Zikia is doing well on her medications.  She reduced her dose of Zoloft down to 50 mg but has been doing well on that so we will continue with this dose for now.  She has been breaking her hydroxyzine in half so we will also decrease this down to 10 mg.  She will return for follow-up in approximately 2 months.   GAD with Panic Attacks: -Continue Zoloft 50 mg daily. 30 tablets with 1 refill sent. -Decrease Atarax to 10 mg TID PRN. 60 tablets with 0 refills.   Collaboration of Care:   Patient/Guardian was advised Release of Information must be obtained prior to any record release in order to collaborate their care with an outside provider. Patient/Guardian was advised if they have not already done so to contact the registration department to sign all necessary forms in order for [place] to release information regarding their care.   Consent: Patient/Guardian gives verbal consent for treatment and assignment of benefits for services provided during this visit. Patient/Guardian expressed understanding and agreed to proceed.    Briant Cedar, MD 08/17/2022, 10:03 AM

## 2022-09-07 ENCOUNTER — Ambulatory Visit (HOSPITAL_COMMUNITY): Payer: Medicare Other

## 2022-10-07 ENCOUNTER — Encounter (HOSPITAL_COMMUNITY): Payer: Medicare Other | Admitting: Student in an Organized Health Care Education/Training Program

## 2022-10-07 NOTE — Progress Notes (Incomplete)
BH MD/PA/NP OP Progress Note  10/07/2022 6:46 AM Paula Leon  MRN:  [id number]  Chief Complaint: No chief complaint on file.  HPI:  Paula Leon is a 57 yr old female who presents for follow up and for medication management.  PPHx is significant for Anxiety, Panic Attacks, and Polysubstance Abuse (Heroin, Cocaine, EtOH), currently on Methadone at [facility], no Suicide Attempts nor Self Injurious Behavior, and 1 prior hospitalization (2015 [address]).    ***   Visit Diagnosis: No diagnosis found.  Past Psychiatric History: Anxiety, Panic Attacks, and Polysubstance Abuse (Heroin, Cocaine, EtOH), currently on Methadone at [facility], no Suicide Attempts nor Self Injurious Behavior, and 1 prior hospitalization (2015 [address]).   Past Medical History:  Past Medical History:  Diagnosis Date   Anemia    IDA   Depression    Hernia of abdominal wall    History of drug abuse (HCC)    has not used in ten years   Substance abuse (HCC)    2012 QUIT Heroin   Transfusion history    due to anemia with hb 4.0    Past Surgical History:  Procedure Laterality Date   INSERTION OF MESH N/A 04/19/2019   Procedure: INSERTION OF MESH;  Surgeon: Darnell Level, MD;  Location: [facility];  Service: General;  Laterality: N/A;   SALPINGECTOMY Left    due to ruptured ectopic   VENTRAL HERNIA REPAIR N/A 04/19/2019   Procedure: VENTRAL HERNIA REPAIR;  Surgeon: Darnell Level, MD;  Location: [facility];  Service: General;  Laterality: N/A;    Family Psychiatric History: Multiple Maternal Members- EtOH abuse Maternal Aunt- Anxiety  Family History:  Family History  Problem Relation Age of Onset   Cancer Mother    Cancer Father    Colon cancer Neg Hx    Colon polyps Neg Hx    Esophageal cancer Neg Hx    Rectal cancer Neg Hx    Stomach cancer Neg Hx     Social History:  Social History   Socioeconomic History   Marital status: Single     Spouse name: Not on file   Number of children: Not on file   Years of education: Not on file   Highest education level: Not on file  Occupational History   Not on file  Tobacco Use   Smoking status: Every Day    Packs/day: 0.25    Types: Cigarettes   Smokeless tobacco: Never   Tobacco comments:    4-5  cigarettes per day.   Wants to stop  Vaping Use   Vaping Use: Never used  Substance and Sexual Activity   Alcohol use: Not Currently    Comment: 2012   Drug use: Not Currently    Types: Heroin, Marijuana    Comment: last time used drugs was 2012   Sexual activity: Not Currently    Birth control/protection: Surgical  Other Topics Concern   Not on file  Social History Narrative   Not on file   Social Determinants of Health   Financial Resource Strain: Medium Risk (02/25/2022)   Overall Financial Resource Strain (CARDIA)    Difficulty of Paying Living Expenses: Somewhat hard  Food Insecurity: Food Insecurity Present (02/25/2022)   Hunger Vital Sign    Worried About Running Out of Food in the Last Year: Often true    Ran Out of Food in the Last Year: Often true  Transportation Needs: No Transportation Needs (02/25/2022)   PRAPARE -  Hydrologist (Medical): No    Lack of Transportation (Non-Medical): No  Physical Activity: Insufficiently Active (02/25/2022)   Exercise Vital Sign    Days of Exercise per Week: 7 days    Minutes of Exercise per Session: 20 min  Stress: Stress Concern Present (02/25/2022)   [place]    Feeling of Stress : Very much  Social Connections: Moderately Isolated (02/25/2022)   Social Connection and Isolation Panel [NHANES]    Frequency of Communication with Friends and Family: More than three times a week    Frequency of Social Gatherings with Friends and Family: More than three times a week    Attends Religious Services: More than 4 times per year    Active Member  of [employer] or Organizations: No    Attends Archivist Meetings: Never    Marital Status: Never married    Allergies:  Allergies  Allergen Reactions   Penicillins Swelling    Throat swelling    Metabolic Disorder Labs: Lab Results  Component Value Date   HGBA1C 5.4 02/27/2022   MPG 108.28 02/27/2022   No results found for: "PROLACTIN" Lab Results  Component Value Date   CHOL 137 02/27/2022   TRIG 77 02/27/2022   HDL 46 02/27/2022   CHOLHDL 3.0 02/27/2022   VLDL 15 02/27/2022   LDLCALC 76 02/27/2022   Lab Results  Component Value Date   TSH 1.226 02/27/2022   TSH 1.544 02/23/2022    Therapeutic Level Labs: No results found for: "LITHIUM" No results found for: "VALPROATE" No results found for: "CBMZ"  Current Medications: Current Outpatient Medications  Medication Sig Dispense Refill   bisacodyl (DULCOLAX) 10 MG suppository Place 1 suppository (10 mg total) rectally as needed for moderate constipation. 5 suppository 0   diclofenac Sodium (VOLTAREN) 1 % GEL Apply 4 g topically 4 (four) times daily. (Patient taking differently: Apply 4 g topically 4 (four) times daily as needed (joint pain).) 150 g 1   docusate sodium (COLACE) 100 MG capsule Take 1 capsule (100 mg total) by mouth every 12 (twelve) hours. 60 capsule 0   ferrous sulfate 325 (65 FE) MG tablet Take 1 tablet (325 mg total) by mouth daily with breakfast. 90 tablet 2   hydrOXYzine (ATARAX) 10 MG tablet Take 1 tablet (10 mg total) by mouth every 8 (eight) hours as needed. 60 tablet 0   methadone (DOLOPHINE) 10 MG/5ML solution Take 35 mg by mouth every morning.     senna (SENOKOT) 8.6 MG TABS tablet Take 2 tablets (17.2 mg total) by mouth daily. 120 tablet 0   sertraline (ZOLOFT) 50 MG tablet Take 1 tablet (50 mg total) by mouth daily. 30 tablet 1   No current facility-administered medications for this visit.     Musculoskeletal: Strength & Muscle Tone: {desc; muscle tone:32375} Gait & Station: {PE  GAIT ED ZDGU:44034} Patient leans: {Patient Leans:21022755}  Psychiatric Specialty Exam: Review of Systems  There were no vitals taken for this visit.There is no height or weight on file to calculate BMI.  General Appearance: {Appearance:22683}  Eye Contact:  {BHH EYE CONTACT:22684}  Speech:  {Speech:22685}  Volume:  {Volume (PAA):22686}  Mood:  {BHH MOOD:22306}  Affect:  {Affect (PAA):22687}  Thought Process:  {Thought Process (PAA):22688}  Orientation:  {BHH ORIENTATION (PAA):22689}  Thought Content: {Thought Content:22690}   Suicidal Thoughts:  {ST/HT (PAA):22692}  Homicidal Thoughts:  {ST/HT (PAA):22692}  Memory:  {BHH VQQVZD:63875}  Judgement:  {Judgement (PAA):22694}  Insight:  {Insight (PAA):22695}  Psychomotor Activity:  {Psychomotor (PAA):22696}  Concentration:  {Concentration:21399}  Recall:  {BHH GOOD/FAIR/POOR:22877}  Fund of Knowledge: {BHH GOOD/FAIR/POOR:22877}  Language: {BHH GOOD/FAIR/POOR:22877}  Akathisia:  {BHH YES OR NO:22294}  Handed:  Right  AIMS (if indicated): {Desc; done/not:10129}  Assets:  {Assets (PAA):22698}  ADL's:  {BHH ACZ'Y:60630}  Cognition: {chl bhh cognition:304700322}  Sleep:  {BHH GOOD/FAIR/POOR:22877}   Screenings: GAD-7    Flowsheet Row Clinical Support from 05/17/2022 in [facility] Counselor from 04/11/2022 in [facility] Office Visit from 04/07/2022 in [place] Office Visit from 02/25/2022 in [place] Office Visit from 10/28/2021 in [place]  Total GAD-7 Score 17 21 18 20 10       PHQ2-9    Flowsheet Row Clinical Support from 05/17/2022 in [facility] Most recent reading at 05/17/2022  3:42 PM Counselor from 04/11/2022 in [facility] Most recent reading at 04/11/2022  9:07 AM Office Visit from 04/07/2022 in [place] Most  recent reading at 04/07/2022  4:20 PM Office Visit from 02/25/2022 in [place] Most recent reading at 02/25/2022 11:08 AM Office Visit from 02/25/2022 in [place] Most recent reading at 02/25/2022 10:46 AM  PHQ-2 Total Score 5 0 0 0 0  PHQ-9 Total Score 7 -- -- 0 --      [place] Office Visit from 05/05/2022 in [facility] Most recent reading at 05/07/2022  2:18 PM ED from 05/06/2022 in [place] Most recent reading at 05/07/2022 12:02 AM ED from 04/16/2022 in [place] Most recent reading at 04/16/2022  5:52 PM  C-SSRS RISK CATEGORY No Risk No Risk No Risk        Assessment and Plan:  Paula Leon is a 57 yr old female who presents for follow up and for medication management.  PPHx is significant for Anxiety, Panic Attacks, and Polysubstance Abuse (Heroin, Cocaine, EtOH), currently on Methadone at [facility], no Suicide Attempts nor Self Injurious Behavior, and 1 prior hospitalization (2015 [place]).    Lisabeth Pick ***   GAD with Panic Attacks: -Continue Zoloft 50 mg daily. 30 tablets with 1 refill sent. -Decrease Atarax to 10 mg TID PRN. 60 tablets with 0 refills.    Collaboration of Care:   Patient/Guardian was advised Release of Information must be obtained prior to any record release in order to collaborate their care with an outside provider. Patient/Guardian was advised if they have not already done so to contact the registration department to sign all necessary forms in order for [place] to release information regarding their care.   Consent: Patient/Guardian gives verbal consent for treatment and assignment of benefits for services provided during this visit. Patient/Guardian expressed understanding and agreed to proceed.    Briant Cedar, MD 10/07/2022, 6:46 AM

## 2022-11-18 ENCOUNTER — Encounter (HOSPITAL_COMMUNITY): Payer: Self-pay | Admitting: Student in an Organized Health Care Education/Training Program

## 2022-11-18 ENCOUNTER — Ambulatory Visit (INDEPENDENT_AMBULATORY_CARE_PROVIDER_SITE_OTHER): Payer: Medicare Other | Admitting: Student in an Organized Health Care Education/Training Program

## 2022-11-18 VITALS — BP 128/81 | HR 84 | Ht 64.0 in | Wt 195.2 lb

## 2022-11-18 DIAGNOSIS — F411 Generalized anxiety disorder: Secondary | ICD-10-CM

## 2022-11-18 DIAGNOSIS — F410 Panic disorder [episodic paroxysmal anxiety] without agoraphobia: Secondary | ICD-10-CM

## 2022-11-18 MED ORDER — HYDROXYZINE HCL 10 MG PO TABS: 10.0000 mg | ORAL_TABLET | Freq: Three times a day (TID) | ORAL | 0 refills | Status: DC | PRN

## 2022-11-18 MED ORDER — SERTRALINE HCL 50 MG PO TABS: 75.0000 mg | ORAL_TABLET | Freq: Every day | ORAL | 1 refills | Status: DC

## 2022-11-18 NOTE — Progress Notes (Signed)
BH MD/PA/NP OP Progress Note  11/18/2022 9:52 AM Paula Leon  MRN:  [id number]  Chief Complaint:  Chief Complaint  Patient presents with   Follow-up   Anxiety   HPI:  Paula Leon is a 57 yr old female who presents for Follow Up and Medication Management.  PPHx is significant for Anxiety, Panic Attacks, and Polysubstance Abuse (Heroin, Cocaine, EtOH), currently on Methadone at [facility], no Suicide Attempts nor Self Injurious Behavior, and 1 prior hospitalization (2015 [place]).     She reports that overall she is continuing to do better.  She reports still having some episodes of anxiety.  She thinks it may be related to her methadone and is considering discussing with the methadone clinic provider about changes to her dose.  Discussed potentially trialing an increase in her Zoloft again to help with her anxiety and she was agreeable with this.  She reports no side effects to her medications.  She reports no SI, HI, or AVH.  She reports sleep is good.  She reports her appetite is doing good.  She reports no other concerns at present.  She will return for follow-up in approximately 6 to 8 weeks.   Visit Diagnosis:    ICD-10-CM   1. Generalized anxiety disorder with panic attacks  F41.1 sertraline (ZOLOFT) 50 MG tablet   F41.0 hydrOXYzine (ATARAX) 10 MG tablet      Past Psychiatric History: Anxiety, Panic Attacks, and Polysubstance Abuse (Heroin, Cocaine, EtOH), currently on Methadone at [facility], no Suicide Attempts nor Self Injurious Behavior, and 1 prior hospitalization (2015 [place]).    Past Medical History:  Past Medical History:  Diagnosis Date   Anemia    IDA   Depression    Hernia of abdominal wall    History of drug abuse ([place])    has not used in ten years   Substance abuse ([place])    2012 QUIT Heroin   Transfusion history    due to anemia with hb 4.0    Past Surgical History:  Procedure Laterality Date   INSERTION OF MESH N/A  04/19/2019   Procedure: INSERTION OF MESH;  Surgeon: Armandina Gemma, MD;  Location: [place];  Service: General;  Laterality: N/A;   SALPINGECTOMY Left    due to ruptured ectopic   VENTRAL HERNIA REPAIR N/A 04/19/2019   Procedure: VENTRAL HERNIA REPAIR;  Surgeon: Armandina Gemma, MD;  Location: [place];  Service: General;  Laterality: N/A;    Family Psychiatric History: Multiple Maternal Members- EtOH abuse Maternal Aunt- Anxiety  Family History:  Family History  Problem Relation Age of Onset   Cancer Mother    Cancer Father    Colon cancer Neg Hx    Colon polyps Neg Hx    Esophageal cancer Neg Hx    Rectal cancer Neg Hx    Stomach cancer Neg Hx     Social History:  Social History   Socioeconomic History   Marital status: Single    Spouse name: Not on file   Number of children: Not on file   Years of education: Not on file   Highest education level: Not on file  Occupational History   Not on file  Tobacco Use   Smoking status: Every Day    Packs/day: 0.25    Types: Cigarettes   Smokeless tobacco: Never   Tobacco comments:    4-5  cigarettes per day.   Wants to stop  Vaping Use  Vaping Use: Never used  Substance and Sexual Activity   Alcohol use: Not Currently    Comment: 2012   Drug use: Not Currently    Types: Heroin, Marijuana    Comment: last time used drugs was 2012   Sexual activity: Not Currently    Birth control/protection: Surgical  Other Topics Concern   Not on file  Social History Narrative   Not on file   Social Determinants of Health   Financial Resource Strain: Medium Risk (02/25/2022)   Overall Financial Resource Strain (CARDIA)    Difficulty of Paying Living Expenses: Somewhat hard  Food Insecurity: Food Insecurity Present (02/25/2022)   Hunger Vital Sign    Worried About [place] in the Last Year: Often true    Ran Out of Food in the Last Year: Often true  Transportation Needs: No Transportation Needs  (02/25/2022)   PRAPARE - Hydrologist (Medical): No    Lack of Transportation (Non-Medical): No  Physical Activity: Insufficiently Active (02/25/2022)   Exercise Vital Sign    Days of Exercise per Week: 7 days    Minutes of Exercise per Session: 20 min  Stress: Stress Concern Present (02/25/2022)   [place]    Feeling of Stress : Very much  Social Connections: Moderately Isolated (02/25/2022)   Social Connection and Isolation Panel [NHANES]    Frequency of Communication with Friends and Family: More than three times a week    Frequency of Social Gatherings with Friends and Family: More than three times a week    Attends Religious Services: More than 4 times per year    Active Member of [employer] or Organizations: No    Attends Archivist Meetings: Never    Marital Status: Never married    Allergies:  Allergies  Allergen Reactions   Penicillins Swelling    Throat swelling    Metabolic Disorder Labs: Lab Results  Component Value Date   HGBA1C 5.4 02/27/2022   MPG 108.28 02/27/2022   No results found for: "PROLACTIN" Lab Results  Component Value Date   CHOL 137 02/27/2022   TRIG 77 02/27/2022   HDL 46 02/27/2022   CHOLHDL 3.0 02/27/2022   VLDL 15 02/27/2022   LDLCALC 76 02/27/2022   Lab Results  Component Value Date   TSH 1.226 02/27/2022   TSH 1.544 02/23/2022    Therapeutic Level Labs: No results found for: "LITHIUM" No results found for: "VALPROATE" No results found for: "CBMZ"  Current Medications: Current Outpatient Medications  Medication Sig Dispense Refill   bisacodyl (DULCOLAX) 10 MG suppository Place 1 suppository (10 mg total) rectally as needed for moderate constipation. 5 suppository 0   diclofenac Sodium (VOLTAREN) 1 % GEL Apply 4 g topically 4 (four) times daily. (Patient taking differently: Apply 4 g topically 4 (four) times daily as needed (joint  pain).) 150 g 1   docusate sodium (COLACE) 100 MG capsule Take 1 capsule (100 mg total) by mouth every 12 (twelve) hours. 60 capsule 0   ferrous sulfate 325 (65 FE) MG tablet Take 1 tablet (325 mg total) by mouth daily with breakfast. 90 tablet 2   hydrOXYzine (ATARAX) 10 MG tablet Take 1 tablet (10 mg total) by mouth every 8 (eight) hours as needed. 60 tablet 0   methadone (DOLOPHINE) 10 MG/5ML solution Take 35 mg by mouth every morning.     senna (SENOKOT) 8.6 MG TABS tablet Take 2  tablets (17.2 mg total) by mouth daily. 120 tablet 0   sertraline (ZOLOFT) 50 MG tablet Take 1.5 tablets (75 mg total) by mouth daily. 45 tablet 1   No current facility-administered medications for this visit.     Musculoskeletal: Strength & Muscle Tone: within normal limits Gait & Station: normal Patient leans: N/A  Psychiatric Specialty Exam: Review of Systems  Respiratory:  Negative for shortness of breath.   Cardiovascular:  Negative for chest pain.  Gastrointestinal:  Negative for abdominal pain, constipation, diarrhea, nausea and vomiting.  Neurological:  Negative for dizziness, weakness and headaches.  Psychiatric/Behavioral:  Negative for dysphoric mood, hallucinations, sleep disturbance and suicidal ideas. The patient is nervous/anxious.     Blood pressure 128/81, pulse 84, height '5\' 4"'$  (1.626 m), weight 195 lb 3.2 oz (88.5 kg), SpO2 100 %.Body mass index is 33.51 kg/m.  General Appearance: Casual and Fairly Groomed  Eye Contact:  Good  Speech:  Clear and Coherent and Normal Rate  Volume:  Normal  Mood:  Anxious  Affect:  Congruent  Thought Process:  Coherent and Goal Directed  Orientation:  Full (Time, Place, and Person)  Thought Content: WDL and Logical   Suicidal Thoughts:  No  Homicidal Thoughts:  No  Memory:  Immediate;   Good Recent;   Good  Judgement:  Good  Insight:  Good  Psychomotor Activity:  Normal  Concentration:  Concentration: Good and Attention Span: Good  Recall:   Good  Fund of Knowledge: Good  Language: Good  Akathisia:  Negative  Handed:  Right  AIMS (if indicated): not done  Assets:  Communication Skills Desire for Improvement Resilience Social Support  ADL's:  Intact  Cognition: WNL  Sleep:  Good   Screenings: GAD-7    Flowsheet Row Clinical Support from 05/17/2022 in [facility] Counselor from 04/11/2022 in [facility] Office Visit from 04/07/2022 in [place] Office Visit from 02/25/2022 in [place] Office Visit from 10/28/2021 in [place]  Total GAD-7 Score '17 21 18 20 10      '$ PHQ2-9    Flowsheet Row Clinical Support from 05/17/2022 in [facility] Most recent reading at 05/17/2022  3:42 PM Counselor from 04/11/2022 in [facility] Most recent reading at 04/11/2022  9:07 AM Office Visit from 04/07/2022 in [place] Most recent reading at 04/07/2022  4:20 PM Office Visit from 02/25/2022 in [place] Most recent reading at 02/25/2022 11:08 AM Office Visit from 02/25/2022 in [place] Most recent reading at 02/25/2022 10:46 AM  PHQ-2 Total Score 5 0 0 0 0  PHQ-9 Total Score 7 -- -- 0 --      [place] Office Visit from 05/05/2022 in [facility] Most recent reading at 05/07/2022  2:18 PM ED from 05/06/2022 in [facility] Emergency Department at [facility] Most recent reading at 05/07/2022 12:02 AM ED from 04/16/2022 in [facility] Emergency Department at [facility] Most recent reading at 04/16/2022  5:52 PM  C-SSRS RISK CATEGORY No Risk No Risk No Risk        Assessment and Plan:  Kirsi Kalata is a 57 yr old female who presents for Follow Up and Medication Management.  PPHx is significant for Anxiety, Panic Attacks, and Polysubstance  Abuse (Heroin, Cocaine, EtOH), currently on Methadone at [facility], no  Suicide Attempts nor Self Injurious Behavior, and 1 prior hospitalization (2015 [place]).    [place] has been doing better but continues to have episodes of increased anxiety.  For this we will trial increasing her Zoloft again.  She will discuss [place] changes to her methadone dose with the provider at her methadone clinic.  We will not make any other changes to her medications at this time.  She will return for follow-up in approximately 6 to 8 weeks.   GAD with Panic Attacks: -Increase Zoloft to 75 mg daily. 45 (50 mg) tablets with 1 refill sent. -Continue Atarax 10 mg TID PRN. 60 tablets with 0 refills.    Collaboration of Care:   Patient/Guardian was advised Release of Information must be obtained prior to any record release in order to collaborate their care with an outside provider. Patient/Guardian was advised if they have not already done so to contact the registration department to sign all necessary forms in order for [place] to release information regarding their care.   Consent: Patient/Guardian gives verbal consent for treatment and assignment of benefits for services provided during this visit. Patient/Guardian expressed understanding and agreed to proceed.    Briant Cedar, MD 11/18/2022, 9:52 AM

## 2022-11-28 NOTE — H&P (Signed)
  Patient: Paula Leon  PID: [id number]  DOB: 1966-04-17  SEX: Female   Patient referred by Iantha Fallen, DDS for extraction teeth2, 4, 5, 7, 8, 18, 19, 28.  CC: No pain. Getting partials  Past Medical History:  Smoker, Mental Health problems, Drug Abuse/Alcohol Abuse, Dental anxiety   Medications: Sertraline, Methadone, Hydroxyzine    Allergies:     Doxycycline    Surgeries:   Oral Surgery         Social History       Smoking: 1/4 ppd           Alcohol:n Drug use:n                             Exam: BMI 35. Caries teeth # 2, 4, 5, 7, 8, 10, 11, 18, 19, 20, 24, 25, 28.  No purulence, edema, fluctuance, trismus. Oral cancer screening negative. Pharynx clear. Mallampati 1. No lymphadenopathy.  Panorex:Caries teeth # 2, 4, 5, 7, 8, 10, 11, 18, 19, 20, 24, 25, 28.   Assessment:  ASA 3 . Non-restorable teeth#  2, 4, 5, 7, 8, 10, 11, 18, 19, 20, 24, 25, 28.              Plan: Extraction Teeth # 2, 4, 5, 7, 8, 10, 11, 18, 19, 20, 24, 25, 28. Alveoloplasty.   IV Sedation.                Rx: none             Risks and complications explained. Questions answered.   Gae Bon, DMD

## 2022-11-29 ENCOUNTER — Other Ambulatory Visit: Payer: Self-pay

## 2022-11-29 ENCOUNTER — Encounter (HOSPITAL_COMMUNITY): Payer: Self-pay | Admitting: Oral Surgery

## 2022-11-29 NOTE — Anesthesia Preprocedure Evaluation (Signed)
Anesthesia Evaluation  Patient identified by MRN, date of birth, ID band Patient awake    Reviewed: Allergy & Precautions, NPO status , Patient's Chart, lab work & pertinent test results  History of Anesthesia Complications Negative for: history of anesthetic complications  Airway Mallampati: II  TM Distance: >3 FB Neck ROM: Full    Dental  (+) Dental Advisory Given,    Pulmonary neg shortness of breath, neg sleep apnea, neg COPD, neg recent URI, Current Smoker and Patient abstained from smoking.   Pulmonary exam normal breath sounds clear to auscultation       Cardiovascular (-) hypertension(-) angina (-) Past MI, (-) Cardiac Stents and (-) CABG + dysrhythmias (palpitations)  Rhythm:Regular Rate:Normal     Neuro/Psych  PSYCHIATRIC DISORDERS (h/o drug abuse (not used in years), on methadone) Anxiety Depression    negative neurological ROS     GI/Hepatic negative GI ROS, Neg liver ROS,,,  Endo/Other  negative endocrine ROS    Renal/GU negative Renal ROS     Musculoskeletal   Abdominal   Peds  Hematology  (+) Blood dyscrasia, anemia   Anesthesia Other Findings   Reproductive/Obstetrics                             Anesthesia Physical Anesthesia Plan  ASA: 3  Anesthesia Plan: General   Post-op Pain Management: Tylenol PO (pre-op)*   Induction: Intravenous  PONV Risk Score and Plan: 2 and Ondansetron, Dexamethasone and Treatment may vary due to age or medical condition  Airway Management Planned: Nasal ETT  Additional Equipment:   Intra-op Plan:   Post-operative Plan: Extubation in OR  Informed Consent: I have reviewed the patients History and Physical, chart, labs and discussed the procedure including the risks, benefits and alternatives for the proposed anesthesia with the patient or authorized representative who has indicated his/her understanding and acceptance.     Dental  advisory given  Plan Discussed with: CRNA and Anesthesiologist  Anesthesia Plan Comments: (Risks of general anesthesia discussed including, but not limited to, sore throat, hoarse voice, chipped/damaged teeth, injury to vocal cords, nausea and vomiting, allergic reactions, lung infection, heart attack, stroke, and death. All questions answered. )        Anesthesia Quick Evaluation

## 2022-11-29 NOTE — Progress Notes (Signed)
PCP - Dr. Abigail Butts at [facility] Internal Medicine Cardiologist - Denies  PPM/ICD - Denies  Chest x-ray - 05/07/2022 EKG - 05/17/2022 Stress Test -Denies ECHO - Denies Cardiac Cath - Denies  CPAP - Denies  Non-diabetic  Blood Thinner Instructions: Denies Aspirin Instructions: Denies  ERAS Protcol - No, NPO  COVID TEST- Denies  Anesthesia review: No  Patient verbally denies any shortness of breath, fever, cough and chest pain during phone call   -------------  SDW INSTRUCTIONS given:  Your procedure is scheduled on Wednesday, November 30, 2022  Report to [facility] Main Entrance "A" at 0730 A.M., and check in at the Admitting office.  Call this number if you have problems the morning of surgery:  [phone]   Remember:  Do not eat after midnight the night before your surgery   Take these medicines the morning of surgery with A SIP OF WATER  Methadone, zoloft, hydroxyzine  As of today, STOP taking any Aspirin (unless otherwise instructed by your surgeon) Aleve, Naproxen, Ibuprofen, Motrin, Advil, Goody's, BC's, all herbal medications, fish oil, and all vitamins.                      Do not wear jewelry, make up, or nail polish            Do not wear lotions, powders, perfumes or deodorant.            Do not shave 48 hours prior to surgery.             Do not bring valuables to the hospital.            [facility] is not responsible for any belongings or valuables.  Do NOT Smoke (Tobacco/Vaping) 24 hours prior to your procedure I f you use a CPAP at night, you may bring all equipment for your overnight stay.   Contacts, glasses, dentures or bridgework may not be worn into surgery.      For patients admitted to the hospital, discharge time will be determined by your treatment team.   Patients discharged the day of surgery will not be allowed to drive home, and someone needs to stay with them for 24 hours.    Special instructions:   [place]- Preparing For  Surgery  Before surgery, you can play an important role. Because skin is not sterile, your skin needs to be as free of germs as possible. You can reduce the number of germs on your skin by washing with Dial Soap before surgery.    Oral Hygiene is also important to reduce your risk of infection.  Remember - BRUSH YOUR TEETH THE MORNING OF SURGERY WITH YOUR REGULAR TOOTHPASTE  Please follow these instructions carefully.   Shower the NIGHT BEFORE SURGERY and the MORNING OF SURGERY with DIAL Soap.   Pat yourself dry with a CLEAN TOWEL.  Wear CLEAN PAJAMAS to bed the night before surgery  Place CLEAN SHEETS on your bed the night of your first shower and   DO NOT SLEEP WITH PETS.   Day of Surgery:  Please shower morning of surgery  Wear Clean/Comfortable clothing the morning of surgery Do not apply any deodorants/lotions.   Remember to brush your teeth WITH YOUR REGULAR TOOTHPASTE.   Questions were answered. Patient verbalized understanding of instructions.

## 2022-11-30 ENCOUNTER — Ambulatory Visit (HOSPITAL_COMMUNITY): Payer: Medicare Other | Admitting: Anesthesiology

## 2022-11-30 ENCOUNTER — Ambulatory Visit (HOSPITAL_COMMUNITY)
Admission: RE | Admit: 2022-11-30 | Discharge: 2022-11-30 | Disposition: A | Discharge status: Home / Self Care | Payer: Medicare Other | Source: Ambulatory Visit | Attending: Oral Surgery | Admitting: Oral Surgery

## 2022-11-30 ENCOUNTER — Other Ambulatory Visit: Payer: Self-pay

## 2022-11-30 ENCOUNTER — Ambulatory Visit (HOSPITAL_BASED_OUTPATIENT_CLINIC_OR_DEPARTMENT_OTHER): Payer: Medicare Other | Admitting: Anesthesiology

## 2022-11-30 ENCOUNTER — Encounter (HOSPITAL_COMMUNITY)
Admission: RE | Disposition: A | Discharge status: Home / Self Care | Payer: Self-pay | Source: Ambulatory Visit | Attending: Oral Surgery

## 2022-11-30 DIAGNOSIS — K0889 Other specified disorders of teeth and supporting structures: Secondary | ICD-10-CM | POA: Diagnosis not present

## 2022-11-30 DIAGNOSIS — K029 Dental caries, unspecified: Secondary | ICD-10-CM | POA: Insufficient documentation

## 2022-11-30 DIAGNOSIS — F418 Other specified anxiety disorders: Secondary | ICD-10-CM

## 2022-11-30 DIAGNOSIS — K056 Periodontal disease, unspecified: Secondary | ICD-10-CM | POA: Diagnosis not present

## 2022-11-30 DIAGNOSIS — F17210 Nicotine dependence, cigarettes, uncomplicated: Secondary | ICD-10-CM | POA: Diagnosis not present

## 2022-11-30 HISTORY — PX: TOOTH EXTRACTION: SHX859

## 2022-11-30 LAB — POCT PREGNANCY, URINE: Preg Test, Ur: NEGATIVE

## 2022-11-30 LAB — COMPREHENSIVE METABOLIC PANEL
ALT: 12 U/L (ref 0–44)
AST: 20 U/L (ref 15–41)
Albumin: 3.4 g/dL — ABNORMAL LOW (ref 3.5–5.0)
Alkaline Phosphatase: 108 U/L (ref 38–126)
Anion gap: 8 (ref 5–15)
BUN: 13 mg/dL (ref 6–20)
CO2: 23 mmol/L (ref 22–32)
Calcium: 8.8 mg/dL — ABNORMAL LOW (ref 8.9–10.3)
Chloride: 107 mmol/L (ref 98–111)
Creatinine, Ser: 0.85 mg/dL (ref 0.44–1.00)
GFR, Estimated: >60 mL/min (ref >60)
Glucose, Bld: 75 mg/dL (ref 70–99)
Potassium: 4.1 mmol/L (ref 3.5–5.1)
Sodium: 138 mmol/L (ref 135–145)
Total Bilirubin: 0.3 mg/dL (ref 0.3–1.2)
Total Protein: 6.8 g/dL (ref 6.5–8.1)

## 2022-11-30 LAB — CBC
HCT: 36.8 % (ref 36.0–46.0)
Hemoglobin: 11.6 g/dL — ABNORMAL LOW (ref 12.0–15.0)
MCH: 24.9 pg — ABNORMAL LOW (ref 26.0–34.0)
MCHC: 31.5 g/dL (ref 30.0–36.0)
MCV: 79 fL — ABNORMAL LOW (ref 80.0–100.0)
Platelets: 167 10*3/uL (ref 150–400)
RBC: 4.66 MIL/uL (ref 3.87–5.11)
RDW: 18.8 % — ABNORMAL HIGH (ref 11.5–15.5)
WBC: 3.3 10*3/uL — ABNORMAL LOW (ref 4.0–10.5)
nRBC: 0 % (ref 0.0–0.2)

## 2022-11-30 SURGERY — DENTAL RESTORATION/EXTRACTIONS
Anesthesia: General

## 2022-11-30 MED ORDER — ONDANSETRON HCL 4 MG/2ML IJ SOLN
INTRAMUSCULAR | Status: DC | PRN
  Administered 2022-11-30: 4 mg via INTRAVENOUS

## 2022-11-30 MED ORDER — LACTATED RINGERS IV SOLN: INTRAVENOUS | Status: DC

## 2022-11-30 MED ORDER — FENTANYL CITRATE (PF) 100 MCG/2ML IJ SOLN
INTRAMUSCULAR | Status: AC
  Filled 2022-11-30: qty 2

## 2022-11-30 MED ORDER — ROCURONIUM BROMIDE 10 MG/ML (PF) SYRINGE
PREFILLED_SYRINGE | INTRAVENOUS | Status: AC
  Filled 2022-11-30: qty 10

## 2022-11-30 MED ORDER — LIDOCAINE 2% (20 MG/ML) 5 ML SYRINGE
INTRAMUSCULAR | Status: AC
  Filled 2022-11-30: qty 5

## 2022-11-30 MED ORDER — MIDAZOLAM HCL 2 MG/2ML IJ SOLN
INTRAMUSCULAR | Status: DC | PRN
  Administered 2022-11-30: 2 mg via INTRAVENOUS

## 2022-11-30 MED ORDER — ORAL CARE MOUTH RINSE: 15.0000 mL | Freq: Once | OROMUCOSAL | Status: AC

## 2022-11-30 MED ORDER — PROPOFOL 10 MG/ML IV BOLUS
INTRAVENOUS | Status: DC | PRN
  Administered 2022-11-30: 150 mg via INTRAVENOUS
  Administered 2022-11-30: 50 mg via INTRAVENOUS

## 2022-11-30 MED ORDER — OXYCODONE-ACETAMINOPHEN 5-325 MG PO TABS: 1.0000 | ORAL_TABLET | Freq: Four times a day (QID) | ORAL | 0 refills | Status: DC | PRN

## 2022-11-30 MED ORDER — CLINDAMYCIN PHOSPHATE 600 MG/50ML IV SOLN
600.0000 mg | INTRAVENOUS | Status: AC
  Administered 2022-11-30: 600 mg via INTRAVENOUS
  Filled 2022-11-30: qty 50

## 2022-11-30 MED ORDER — ONDANSETRON HCL 4 MG/2ML IJ SOLN
INTRAMUSCULAR | Status: AC
  Filled 2022-11-30: qty 2

## 2022-11-30 MED ORDER — DEXAMETHASONE SODIUM PHOSPHATE 10 MG/ML IJ SOLN
INTRAMUSCULAR | Status: AC
  Filled 2022-11-30: qty 1

## 2022-11-30 MED ORDER — FENTANYL CITRATE (PF) 250 MCG/5ML IJ SOLN
INTRAMUSCULAR | Status: DC | PRN
  Administered 2022-11-30: 100 ug via INTRAVENOUS

## 2022-11-30 MED ORDER — OXYCODONE HCL 5 MG/5ML PO SOLN: 5.0000 mg | Freq: Once | ORAL | Status: AC | PRN

## 2022-11-30 MED ORDER — 0.9 % SODIUM CHLORIDE (POUR BTL) OPTIME
TOPICAL | Status: DC | PRN
  Administered 2022-11-30: 1000 mL

## 2022-11-30 MED ORDER — PROMETHAZINE HCL 25 MG/ML IJ SOLN: 6.2500 mg | INTRAMUSCULAR | Status: DC | PRN

## 2022-11-30 MED ORDER — LIDOCAINE-EPINEPHRINE 2 %-1:100000 IJ SOLN
INTRAMUSCULAR | Status: DC | PRN
  Administered 2022-11-30: 20 mL via INTRADERMAL

## 2022-11-30 MED ORDER — FENTANYL CITRATE (PF) 100 MCG/2ML IJ SOLN
25.0000 ug | INTRAMUSCULAR | Status: DC | PRN
  Administered 2022-11-30 (×3): 50 ug via INTRAVENOUS

## 2022-11-30 MED ORDER — CHLORHEXIDINE GLUCONATE 0.12 % MT SOLN
15.0000 mL | Freq: Once | OROMUCOSAL | Status: AC
  Administered 2022-11-30: 15 mL via OROMUCOSAL
  Filled 2022-11-30: qty 15

## 2022-11-30 MED ORDER — SUGAMMADEX SODIUM 200 MG/2ML IV SOLN
INTRAVENOUS | Status: DC | PRN
  Administered 2022-11-30: 200 mg via INTRAVENOUS

## 2022-11-30 MED ORDER — OXYMETAZOLINE HCL 0.05 % NA SOLN
NASAL | Status: DC | PRN
  Administered 2022-11-30 (×3): 2 via NASAL

## 2022-11-30 MED ORDER — ROCURONIUM BROMIDE 10 MG/ML (PF) SYRINGE
PREFILLED_SYRINGE | INTRAVENOUS | Status: DC | PRN
  Administered 2022-11-30: 50 mg via INTRAVENOUS

## 2022-11-30 MED ORDER — ACETAMINOPHEN 500 MG PO TABS
1000.0000 mg | ORAL_TABLET | Freq: Once | ORAL | Status: AC
  Administered 2022-11-30: 1000 mg via ORAL
  Filled 2022-11-30: qty 2

## 2022-11-30 MED ORDER — LIDOCAINE 2% (20 MG/ML) 5 ML SYRINGE
INTRAMUSCULAR | Status: DC | PRN
  Administered 2022-11-30: 100 mg via INTRAVENOUS

## 2022-11-30 MED ORDER — DEXAMETHASONE SODIUM PHOSPHATE 10 MG/ML IJ SOLN
INTRAMUSCULAR | Status: DC | PRN
  Administered 2022-11-30: 10 mg via INTRAVENOUS

## 2022-11-30 MED ORDER — LIDOCAINE-EPINEPHRINE 2 %-1:100000 IJ SOLN
INTRAMUSCULAR | Status: AC
  Filled 2022-11-30: qty 1

## 2022-11-30 MED ORDER — AMOXICILLIN 500 MG PO CAPS: 500.0000 mg | ORAL_CAPSULE | Freq: Three times a day (TID) | ORAL | 0 refills | Status: DC

## 2022-11-30 MED ORDER — FENTANYL CITRATE (PF) 250 MCG/5ML IJ SOLN
INTRAMUSCULAR | Status: AC
  Filled 2022-11-30: qty 5

## 2022-11-30 MED ORDER — PROPOFOL 10 MG/ML IV BOLUS
INTRAVENOUS | Status: AC
  Filled 2022-11-30: qty 20

## 2022-11-30 MED ORDER — MIDAZOLAM HCL 2 MG/2ML IJ SOLN
INTRAMUSCULAR | Status: AC
  Filled 2022-11-30: qty 2

## 2022-11-30 MED ORDER — OXYCODONE HCL 5 MG PO TABS
ORAL_TABLET | ORAL | Status: AC
  Filled 2022-11-30: qty 1

## 2022-11-30 MED ORDER — OXYCODONE HCL 5 MG PO TABS
5.0000 mg | ORAL_TABLET | Freq: Once | ORAL | Status: AC | PRN
  Administered 2022-11-30: 5 mg via ORAL

## 2022-11-30 SURGICAL SUPPLY — 36 items
BAG COUNTER SPONGE SURGICOUNT (BAG) IMPLANT
BAG SPNG CNTER NS LX DISP (BAG)
BLADE SURG 15 STRL LF DISP TIS (BLADE) ×1 IMPLANT
BLADE SURG 15 STRL SS (BLADE) ×1
BUR CROSS CUT FISSURE 1.6 (BURR) ×1 IMPLANT
BUR EGG ELITE 4.0 (BURR) ×1 IMPLANT
CANISTER SUCT 3000ML PPV (MISCELLANEOUS) ×1 IMPLANT
COVER SURGICAL LIGHT HANDLE (MISCELLANEOUS) ×1 IMPLANT
GAUZE PACKING FOLDED 2  STR (GAUZE/BANDAGES/DRESSINGS) ×1
GAUZE PACKING FOLDED 2 STR (GAUZE/BANDAGES/DRESSINGS) ×1 IMPLANT
GLOVE BIO SURGEON STRL SZ 6.5 (GLOVE) IMPLANT
GLOVE BIO SURGEON STRL SZ7 (GLOVE) IMPLANT
GLOVE BIO SURGEON STRL SZ8 (GLOVE) ×1 IMPLANT
GLOVE BIOGEL PI IND STRL 6.5 (GLOVE) IMPLANT
GLOVE BIOGEL PI IND STRL 7.0 (GLOVE) IMPLANT
GOWN STRL REUS W/ TWL LRG LVL3 (GOWN DISPOSABLE) ×1 IMPLANT
GOWN STRL REUS W/ TWL XL LVL3 (GOWN DISPOSABLE) ×1 IMPLANT
GOWN STRL REUS W/TWL LRG LVL3 (GOWN DISPOSABLE) ×1
GOWN STRL REUS W/TWL XL LVL3 (GOWN DISPOSABLE) ×1
IV NS 1000ML (IV SOLUTION) ×1
IV NS 1000ML BAXH (IV SOLUTION) ×1 IMPLANT
KIT BASIN OR (CUSTOM PROCEDURE TRAY) ×1 IMPLANT
KIT TURNOVER KIT B (KITS) ×1 IMPLANT
NDL HYPO 25GX1X1/2 BEV (NEEDLE) ×2 IMPLANT
NEEDLE HYPO 25GX1X1/2 BEV (NEEDLE) ×2 IMPLANT
NS IRRIG 1000ML POUR BTL (IV SOLUTION) ×1 IMPLANT
PAD ARMBOARD 7.5X6 YLW CONV (MISCELLANEOUS) ×1 IMPLANT
SLEEVE IRRIGATION ELITE 7 (MISCELLANEOUS) ×1 IMPLANT
SPIKE FLUID TRANSFER (MISCELLANEOUS) ×1 IMPLANT
SPONGE SURGIFOAM ABS GEL 12-7 (HEMOSTASIS) IMPLANT
SUT CHROMIC 3 0 PS 2 (SUTURE) ×1 IMPLANT
SYR BULB IRRIG 60ML STRL (SYRINGE) ×1 IMPLANT
SYR CONTROL 10ML LL (SYRINGE) ×1 IMPLANT
TRAY ENT MC OR (CUSTOM PROCEDURE TRAY) ×1 IMPLANT
TUBING IRRIGATION (MISCELLANEOUS) ×1 IMPLANT
YANKAUER SUCT BULB TIP NO VENT (SUCTIONS) ×1 IMPLANT

## 2022-11-30 NOTE — Anesthesia Postprocedure Evaluation (Signed)
Anesthesia Post Note  Patient: Paula Leon  Procedure(s) Performed: DENTAL RESTORATION/EXTRACTIONS     Patient location during evaluation: PACU Anesthesia Type: General Level of consciousness: awake Pain management: pain level controlled Vital Signs Assessment: post-procedure vital signs reviewed and stable Respiratory status: spontaneous breathing, nonlabored ventilation and respiratory function stable Cardiovascular status: blood pressure returned to baseline and stable Postop Assessment: no apparent nausea or vomiting Anesthetic complications: no   No notable events documented.  Last Vitals:  Vitals:   11/30/22 1145 11/30/22 1200  BP: (!) 130/92 125/79  Pulse: 83 79  Resp: 11 11  Temp:  36.5 C  SpO2: 96% 92%    Last Pain:  Vitals:   11/30/22 1200  TempSrc:   PainSc: 3                  Nilda Simmer

## 2022-11-30 NOTE — Op Note (Signed)
Paula Leon, Paula Leon MEDICAL RECORD NO: [id number] ACCOUNT NO: [id number] DATE OF BIRTH: 14-Oct-1965 FACILITY: [facility] LOCATION: [facility] PHYSICIAN: Gae Bon, DDS  Operative Report   DATE OF PROCEDURE: 11/30/2022  PREOPERATIVE DIAGNOSIS:  Nonrestorable teeth secondary to dental caries and periodontal disease #2, 4, 5, 6, 7, 8, 10, 11, 18, 19, 20, 24, 25, 28.  POSTOPERATIVE DIAGNOSIS:  Nonrestorable teeth secondary to dental caries and periodontal disease #2, 4, 5, 6, 7, 8, 10, 11, 18, 19, 20, 24, 25, 28.  PROCEDURE:  Extraction of teeth per above.  Alveoloplasty right maxilla, left maxilla and left mandible.  SURGEON:  Gae Bon, DDS  ANESTHESIA:  General, nasal intubation, Dr. Kalman Shan attending.  DESCRIPTION OF PROCEDURE:  The patient was taken to the operating room and placed on the table in supine position.  General anesthesia was administered and nasal endotracheal tube was placed and secured.  The eyes were protected.  The patient was draped  for surgery.  Timeout was performed.  The posterior pharynx was suctioned and a throat pack was placed.  2% lidocaine 1:100,000 epinephrine was infiltrated in an inferior alveolar block on the right and left sides and buccal and palatal infiltration  around maxillary teeth to be removed.  A bite block was placed on the right side of the mouth.  A 15 blade was used to make an incision in the left mandible, beginning at tooth #18 and incising through the gingiva and the partially exposed roots of 18,  19 and then around tooth #20 both buccally and lingually.  The periosteum was reflected.  The teeth were elevated and removed from the mouth with the dental forceps.  The sockets were curetted.  The periosteum was reflected to expose the alveolar crest,  which was irregular in contour owing to the periodontal disease present.  Alveoloplasty was performed using the egg bur followed by the bone file.  Then, the area was irrigated and closed with 3-0  chromic.  The left maxilla was operated next.  A 15 blade  was used to make an incision proximal to tooth #11 and carried forward around teeth #11, 10, 8, 7 and 6.  The periosteum was reflected.  The teeth were elevated, but teeth numbers 10 and 11 could not be removed with the forceps.  The Stryker handpiece  with fissure bur was used to remove bone around these teeth and then the teeth were elevated and removed. Teeth #7 and 8 were removed with the dental forceps.  The sockets were curetted and the egg bur was used to smooth the bone, which was irregular in  contour owing to missing teeth in the area and need for removal of interproximal bone, alveoplasty was performed with egg bur followed by the bone file.  Then, the left maxilla was irrigated and closed with 3-0 chromic.  Then, the bite block was  repositioned, the 15 blade was used to make an incision beginning at tooth #2. Carried forward to teeth #4 and 5, and again #6.  The periosteum was reflected.  The teeth were removed with the dental forceps.  The tissue was trimmed and debrided and then  alveoplasty was performed after the teeth were removed using the egg bur followed by the bone file.  Then, the area was irrigated and closed with 3-0 chromic.  Then, in the right mandible the 15 blade was used to make an incision around teeth #24, 5 and  tooth #28.  The periosteum was reflected.  The teeth were  easily removed in the front with the Ash forceps #24 and 25.  Tooth #28 required elevation with 301 elevator and then the Ash forceps was used to remove the tooth.  The new sockets were curetted,  debrided, irrigated and closed with 3-0 chromic.  The oral cavity was then irrigated and suctioned.  The throat pack was removed.  The patient was left under care of anesthesia for extubation and transported to recovery with plans for discharge home  through day surgery.  ESTIMATED BLOOD LOSS:  Minimum.  COMPLICATIONS:  None.  SPECIMENS:   None.   SHY D: 11/30/2022 11:10:04 am T: 11/30/2022 11:45:00 am  JOB: [id number]

## 2022-11-30 NOTE — Op Note (Signed)
11/30/2022  11:04 AM  PATIENT:  Paula Leon  57 y.o. female  PRE-OPERATIVE DIAGNOSIS:  NON-RESTORABLE TEETH # 2, 4, 5, 6, 7, 8, 10, 11, 18, 19, 20, 24, 25, 28 SECONDARY TO DENTAL CARIES  POST-OPERATIVE DIAGNOSIS:  SAME  PROCEDURE:  Procedure(s): EXTRACTIONS TEETH # 2, 4, 5, 6, 7, 8, 10, 11, 18, 19, 20, 24, 25, 28, ALVEOLOPLASTY RIGHT MAXILLA, LEFT MAXILLA AND MANDIBLE.  SURGEON:  Surgeon(s): Diona Browner, DMD  ANESTHESIA:   local and general  EBL:  minimal  DRAINS: none   SPECIMEN:  No Specimen  COUNTS:  YES  PLAN OF CARE: Discharge to home after PACU  PATIENT DISPOSITION:  PACU - hemodynamically stable.   PROCEDURE DETAILS: Dictation # [id number]  Gae Bon, DMD 11/30/2022 11:04 AM

## 2022-11-30 NOTE — Transfer of Care (Signed)
Immediate Anesthesia Transfer of Care Note  Patient: Paula Leon  Procedure(s) Performed: DENTAL RESTORATION/EXTRACTIONS  Patient Location: PACU  Anesthesia Type:General  Level of Consciousness: drowsy, patient cooperative, and responds to stimulation  Airway & Oxygen Therapy: Patient Spontanous Breathing and Patient connected to face mask oxygen  Post-op Assessment: Report given to RN, Post -op Vital signs reviewed and stable, and Patient moving all extremities X 4  Post vital signs: Reviewed and stable  Last Vitals:  Vitals Value Taken Time  BP 134/86 11/30/22 1119  Temp    Pulse 104 11/30/22 1120  Resp 11 11/30/22 1120  SpO2 98 % 11/30/22 1120  Vitals shown include unvalidated device data.  Last Pain:  Vitals:   11/30/22 0906  TempSrc:   PainSc: 0-No pain         Complications: No notable events documented.

## 2022-11-30 NOTE — Anesthesia Procedure Notes (Addendum)
Procedure Name: Intubation Date/Time: 11/30/2022 10:21 AM  Performed by: Michele Rockers, CRNAPre-anesthesia Checklist: Patient identified, Emergency Drugs available, Suction available and Patient being monitored Patient Re-evaluated:Patient Re-evaluated prior to induction Oxygen Delivery Method: Circle system utilized Preoxygenation: Pre-oxygenation with 100% oxygen Induction Type: IV induction Ventilation: Mask ventilation without difficulty and Oral airway inserted - appropriate to patient size Laryngoscope Size: Mac, 3 and Glidescope Grade View: Grade II Nasal Tubes: Nasal prep performed, Nasal Rae and Left Tube size: 6.5 mm Number of attempts: 1 Airway Equipment and Method: Video-laryngoscopy Placement Confirmation: ETT inserted through vocal cords under direct vision, positive ETCO2 and breath sounds checked- equal and bilateral Tube secured with: Tape Dental Injury: Teeth and Oropharynx as per pre-operative assessment

## 2022-11-30 NOTE — H&P (Signed)
H&P documentation  -History and Physical Reviewed  -Patient has been re-examined  -No change in the plan of care  Paula Leon  

## 2022-12-01 ENCOUNTER — Encounter (HOSPITAL_COMMUNITY): Payer: Self-pay | Admitting: Oral Surgery

## 2023-02-10 ENCOUNTER — Other Ambulatory Visit: Payer: Self-pay | Admitting: Student

## 2023-02-10 ENCOUNTER — Encounter: Payer: Medicare Other | Admitting: Student

## 2023-02-10 DIAGNOSIS — Z1231 Encounter for screening mammogram for malignant neoplasm of breast: Secondary | ICD-10-CM

## 2023-02-10 NOTE — Progress Notes (Deleted)
CC: Abdominal pain  HPI:  [name] is a 57 y.o. female with a past medical history of constipation, anxiety, iron deficiency anemia, Polysubstance use on methadone who presents for concerns of abdominal pain.   Anxiety: Zoloft 75 mg daily, hydroxyzine 10 mg TID prn   Most recent labs: 11/30/2022 CBC white count 3.3, hemoglobin 11.6, MCV 79, platelets 167 CMP sodium 138, potassium 4.9, creatinine 0.85  Past Medical History:  Diagnosis Date   Anemia    IDA   Depression    Hernia of abdominal wall    History of drug abuse (HCC)    has not used in ten years   Substance abuse (HCC)    2012 QUIT Heroin   Transfusion history    due to anemia with hb 4.0     Current Outpatient Medications:    amoxicillin (AMOXIL) 500 MG capsule, Take 1 capsule (500 mg total) by mouth 3 (three) times daily., Disp: 21 capsule, Rfl: 0   bisacodyl (DULCOLAX) 10 MG suppository, Place 1 suppository (10 mg total) rectally as needed for moderate constipation. (Patient not taking: Reported on 11/28/2022), Disp: 5 suppository, Rfl: 0   diclofenac Sodium (VOLTAREN) 1 % GEL, Apply 4 g topically 4 (four) times daily. (Patient not taking: Reported on 11/28/2022), Disp: 150 g, Rfl: 1   docusate sodium (COLACE) 100 MG capsule, Take 1 capsule (100 mg total) by mouth every 12 (twelve) hours. (Patient not taking: Reported on 11/28/2022), Disp: 60 capsule, Rfl: 0   ferrous sulfate 325 (65 FE) MG tablet, Take 1 tablet (325 mg total) by mouth daily with breakfast. (Patient not taking: Reported on 11/28/2022), Disp: 90 tablet, Rfl: 2   hydrOXYzine (ATARAX) 10 MG tablet, Take 1 tablet (10 mg total) by mouth every 8 (eight) hours as needed. (Patient taking differently: Take 10 mg by mouth every 8 (eight) hours as needed for anxiety.), Disp: 60 tablet, Rfl: 0   ibuprofen (ADVIL) 200 MG tablet, Take 200 mg by mouth 3 (three) times daily as needed for mild pain or moderate pain., Disp: , Rfl:    methadone (DOLOPHINE) 10 MG/5ML  solution, Take 30 mg by mouth every morning., Disp: , Rfl:    oxyCODONE-acetaminophen (PERCOCET) 5-325 MG tablet, Take 1 tablet by mouth every 6 (six) hours as needed., Disp: 12 tablet, Rfl: 0   penicillin v potassium (VEETID) 500 MG tablet, Take 500 mg by mouth every 6 (six) hours., Disp: , Rfl:    senna (SENOKOT) 8.6 MG TABS tablet, Take 2 tablets (17.2 mg total) by mouth daily. (Patient not taking: Reported on 11/28/2022), Disp: 120 tablet, Rfl: 0   sertraline (ZOLOFT) 50 MG tablet, Take 1.5 tablets (75 mg total) by mouth daily. (Patient taking differently: Take 50 mg by mouth daily in the afternoon.), Disp: 45 tablet, Rfl: 1  Review of Systems:  ***  Constitutional: Eye: Respiratory: Cardiovascular: GI: MSK: GU: Skin: Neuro: Endocrine:   Physical Exam:  There were no vitals filed for this visit. *** General: Patient is sitting comfortably in the room  Eyes: Pupils equal and reactive to light, EOM intact  Head: Normocephalic, atraumatic  Neck: Supple, nontender, full range of motion, No JVD Cardio: Regular rate and rhythm, no murmurs, rubs or gallops. 2+ pulses to bilateral upper and lower extremities  Chest: No chest tenderness Pulmonary: Clear to ausculation bilaterally with no rales, rhonchi, and crackles  Abdomen: Soft, nontender with normoactive bowel sounds with no rebound or guarding  Neuro: Alert and orientated x3. CN II-XII  intact. Sensation intact to upper and lower extremities. 2+ patellar reflex.  Back: No midline tenderness, no step off or deformities noted. No paraspinal muscle tenderness.  Skin: No rashes noted  MSK: 5/5 strength to upper and lower extremities.    Assessment & Plan:   No problem-specific Assessment & Plan notes found for this encounter.    Patient {GC/GE:[id number]::"discussed with","seen with"} Dr. {NAMES:[id number]::"Guilloud","Hoffman","Mullen","Narendra","Williams","Vincent"}  Modena Slater, DO PGY-1 Internal Medicine Resident  Pager:  [phone]

## 2023-02-23 ENCOUNTER — Telehealth (INDEPENDENT_AMBULATORY_CARE_PROVIDER_SITE_OTHER): Payer: Medicare Other | Admitting: Student in an Organized Health Care Education/Training Program

## 2023-02-23 ENCOUNTER — Encounter (HOSPITAL_COMMUNITY): Payer: Self-pay | Admitting: Student in an Organized Health Care Education/Training Program

## 2023-02-23 DIAGNOSIS — F411 Generalized anxiety disorder: Secondary | ICD-10-CM | POA: Diagnosis not present

## 2023-02-23 DIAGNOSIS — F410 Panic disorder [episodic paroxysmal anxiety] without agoraphobia: Secondary | ICD-10-CM | POA: Diagnosis not present

## 2023-02-23 MED ORDER — HYDROXYZINE HCL 10 MG PO TABS: 10.0000 mg | ORAL_TABLET | Freq: Three times a day (TID) | ORAL | 0 refills | Status: DC | PRN

## 2023-02-23 MED ORDER — SERTRALINE HCL 50 MG PO TABS: 75.0000 mg | ORAL_TABLET | Freq: Every day | ORAL | 1 refills | Status: DC

## 2023-02-23 NOTE — Progress Notes (Signed)
BH MD/PA/NP OP Progress Note  Virtual Visit via Video Note  I connected with Paula Leon on 02/23/23 at  4:30 PM EDT by a video enabled telemedicine application and verified that I am speaking with the correct person using two identifiers.  Location: Patient: Home Provider: [facility]   I discussed the limitations of evaluation and management by telemedicine and the availability of in person appointments. The patient expressed understanding and agreed to proceed.   02/23/2023 5:07 PM Paula Leon  MRN:  [id number]  Chief Complaint:  Chief Complaint  Patient presents with   Follow-up   Anxiety   HPI:  Paula Leon is a 57 yr old female who presents via Virtual Video Visit for Follow Up and Medication Management.  PPHx is significant for Anxiety, Panic Attacks, and Polysubstance Abuse (Heroin, Cocaine, EtOH), currently on Methadone at [facility], no Suicide Attempts nor Self Injurious Behavior, and 1 prior hospitalization (2015 [address]).     She reports that she has been doing good since her last appointment.  She reports her anxiety has been well-controlled.  She reports no panic attacks since her last appointment.  She reports no side effects to the increase in Zoloft made at the last appointment.  She reports she has a job now and her appetite has improved.  She reports she is exercising more and doing well.  Discussed with her that we would not make any changes to her medications at this time and she was agreeable with this.  She reports no SI, HI, or AVH.  She reports sleep is good.  She reports her appetite is doing good.  She reports no other concerns at present.  She will return follow-up in approximately 2 months.   Visit Diagnosis:    ICD-10-CM   1. Generalized anxiety disorder with panic attacks  F41.1 sertraline (ZOLOFT) 50 MG tablet   F41.0 hydrOXYzine (ATARAX) 10 MG tablet      Past Psychiatric History: Anxiety, Panic Attacks, and Polysubstance Abuse (Heroin,  Cocaine, EtOH), currently on Methadone at [facility], no Suicide Attempts nor Self Injurious Behavior, and 1 prior hospitalization (2015 [address]).    Past Medical History:  Past Medical History:  Diagnosis Date   Anemia    IDA   Depression    Hernia of abdominal wall    History of drug abuse (HCC)    has not used in ten years   Substance abuse (HCC)    2012 QUIT Heroin   Transfusion history    due to anemia with hb 4.0    Past Surgical History:  Procedure Laterality Date   INSERTION OF MESH N/A 04/19/2019   Procedure: INSERTION OF MESH;  Surgeon: Darnell Level, MD;  Location: [facility];  Service: General;  Laterality: N/A;   SALPINGECTOMY Left    due to ruptured ectopic   TOOTH EXTRACTION N/A 11/30/2022   Procedure: DENTAL RESTORATION/EXTRACTIONS;  Surgeon: Ocie Doyne, DMD;  Location: [facility];  Service: Oral Surgery;  Laterality: N/A;   VENTRAL HERNIA REPAIR N/A 04/19/2019   Procedure: VENTRAL HERNIA REPAIR;  Surgeon: Darnell Level, MD;  Location: [facility];  Service: General;  Laterality: N/A;    Family Psychiatric History: Multiple Maternal Members- EtOH abuse Maternal Aunt- Anxiety  Family History:  Family History  Problem Relation Age of Onset   Cancer Mother    Cancer Father    Colon cancer Neg Hx    Colon polyps Neg Hx    Esophageal cancer Neg  Hx    Rectal cancer Neg Hx    Stomach cancer Neg Hx     Social History:  Social History   Socioeconomic History   Marital status: Single    Spouse name: Not on file   Number of children: Not on file   Years of education: Not on file   Highest education level: Not on file  Occupational History   Not on file  Tobacco Use   Smoking status: Every Day    Packs/day: .25    Types: Cigarettes   Smokeless tobacco: Never   Tobacco comments:    4-5  cigarettes per day.   Wants to stop  Vaping Use   Vaping Use: Never used  Substance and Sexual Activity   Alcohol use: Not  Currently    Comment: 2012   Drug use: Not Currently    Types: Heroin, Marijuana    Comment: last time used drugs was 2012   Sexual activity: Not Currently    Birth control/protection: Surgical  Other Topics Concern   Not on file  Social History Narrative   Not on file   Social Determinants of Health   Financial Resource Strain: Medium Risk (02/25/2022)   Overall Financial Resource Strain (CARDIA)    Difficulty of Paying Living Expenses: Somewhat hard  Food Insecurity: Food Insecurity Present (02/25/2022)   Hunger Vital Sign    Worried About Running Out of Food in the Last Year: Often true    Ran Out of Food in the Last Year: Often true  Transportation Needs: No Transportation Needs (02/25/2022)   PRAPARE - Administrator, Civil Service (Medical): No    Lack of Transportation (Non-Medical): No  Physical Activity: Insufficiently Active (02/25/2022)   Exercise Vital Sign    Days of Exercise per Week: 7 days    Minutes of Exercise per Session: 20 min  Stress: Stress Concern Present (02/25/2022)   [employer] of Occupational Health - Occupational Stress Questionnaire    Feeling of Stress : Very much  Social Connections: Moderately Isolated (02/25/2022)   Social Connection and Isolation Panel [NHANES]    Frequency of Communication with Friends and Family: More than three times a week    Frequency of Social Gatherings with Friends and Family: More than three times a week    Attends Religious Services: More than 4 times per year    Active Member of Clubs or Organizations: No    Attends Banker Meetings: Never    Marital Status: Never married    Allergies:  No Known Allergies   Metabolic Disorder Labs: Lab Results  Component Value Date   HGBA1C 5.4 02/27/2022   MPG 108.28 02/27/2022   No results found for: "PROLACTIN" Lab Results  Component Value Date   CHOL 137 02/27/2022   TRIG 77 02/27/2022   HDL 46 02/27/2022   CHOLHDL 3.0 02/27/2022   VLDL 15  02/27/2022   LDLCALC 76 02/27/2022   Lab Results  Component Value Date   TSH 1.226 02/27/2022   TSH 1.544 02/23/2022    Therapeutic Level Labs: No results found for: "LITHIUM" No results found for: "VALPROATE" No results found for: "CBMZ"  Current Medications: Current Outpatient Medications  Medication Sig Dispense Refill   amoxicillin (AMOXIL) 500 MG capsule Take 1 capsule (500 mg total) by mouth 3 (three) times daily. 21 capsule 0   bisacodyl (DULCOLAX) 10 MG suppository Place 1 suppository (10 mg total) rectally as needed for moderate constipation. (Patient not taking:  Reported on 11/28/2022) 5 suppository 0   diclofenac Sodium (VOLTAREN) 1 % GEL Apply 4 g topically 4 (four) times daily. (Patient not taking: Reported on 11/28/2022) 150 g 1   docusate sodium (COLACE) 100 MG capsule Take 1 capsule (100 mg total) by mouth every 12 (twelve) hours. (Patient not taking: Reported on 11/28/2022) 60 capsule 0   ferrous sulfate 325 (65 FE) MG tablet Take 1 tablet (325 mg total) by mouth daily with breakfast. (Patient not taking: Reported on 11/28/2022) 90 tablet 2   hydrOXYzine (ATARAX) 10 MG tablet Take 1 tablet (10 mg total) by mouth every 8 (eight) hours as needed. 60 tablet 0   ibuprofen (ADVIL) 200 MG tablet Take 200 mg by mouth 3 (three) times daily as needed for mild pain or moderate pain.     methadone (DOLOPHINE) 10 MG/5ML solution Take 30 mg by mouth every morning.     oxyCODONE-acetaminophen (PERCOCET) 5-325 MG tablet Take 1 tablet by mouth every 6 (six) hours as needed. 12 tablet 0   penicillin v potassium (VEETID) 500 MG tablet Take 500 mg by mouth every 6 (six) hours.     senna (SENOKOT) 8.6 MG TABS tablet Take 2 tablets (17.2 mg total) by mouth daily. (Patient not taking: Reported on 11/28/2022) 120 tablet 0   sertraline (ZOLOFT) 50 MG tablet Take 1.5 tablets (75 mg total) by mouth daily. 45 tablet 1   No current facility-administered medications for this visit.      Musculoskeletal: Strength & Muscle Tone: within normal limits Gait & Station: normal Patient leans: N/A  Psychiatric Specialty Exam: Review of Systems  Respiratory:  Negative for shortness of breath.   Cardiovascular:  Negative for chest pain.  Gastrointestinal:  Negative for abdominal pain, constipation, diarrhea, nausea and vomiting.  Neurological:  Negative for dizziness, weakness and headaches.  Psychiatric/Behavioral:  Negative for dysphoric mood, hallucinations and suicidal ideas. The patient is not nervous/anxious.     There were no vitals taken for this visit.There is no height or weight on file to calculate BMI.  General Appearance: Casual and Fairly Groomed  Eye Contact:  Good  Speech:  Clear and Coherent and Normal Rate  Volume:  Normal  Mood:  Anxious  Affect:  Congruent  Thought Process:  Coherent and Goal Directed  Orientation:  Full (Time, Place, and Person)  Thought Content: WDL and Logical   Suicidal Thoughts:  No  Homicidal Thoughts:  No  Memory:  Immediate;   Good Recent;   Good  Judgement:  Good  Insight:  Good  Psychomotor Activity:  Normal  Concentration:  Concentration: Good and Attention Span: Good  Recall:  Good  Fund of Knowledge: Good  Language: Good  Akathisia:  Negative  Handed:  Right  AIMS (if indicated): not done  Assets:  Communication Skills Desire for Improvement Resilience Social Support  ADL's:  Intact  Cognition: WNL  Sleep:  Good   Screenings: GAD-7    Flowsheet Row Clinical Support from 05/17/2022 in [facility] Counselor from 04/11/2022 in [facility] Office Visit from 04/07/2022 in [facility] Office Visit from 02/25/2022 in [facility] Office Visit from 10/28/2021 in [facility]  Total GAD-7 Score 17 21 18 20 10       PHQ2-9    Flowsheet Row Clinical Support from 05/17/2022 in [facility] Most recent reading at 05/17/2022  3:42 PM Counselor from 04/11/2022 in  [facility] Most recent reading at 04/11/2022  9:07 AM Office Visit from 04/07/2022 in [facility] Most recent reading at 04/07/2022  4:20 PM Office Visit from 02/25/2022 in [facility] Most recent reading at 02/25/2022 11:08 AM Office Visit from 02/25/2022 in [facility] Most recent reading at 02/25/2022 10:46 AM  PHQ-2 Total Score 5 0 0 0 0  PHQ-9 Total Score 7 -- -- 0 --      Flowsheet Row Admission (Discharged) from 11/30/2022 in [facility] Most recent reading at 11/30/2022  9:09 AM Office Visit from 05/05/2022 in [facility] Most recent reading at 05/07/2022  2:18 PM ED from 05/06/2022 in [facility] Emergency Department at [facility] Most recent reading at 05/07/2022 12:02 AM  C-SSRS RISK CATEGORY No Risk No Risk No Risk        Assessment and Plan:  Paula Leon is a 57 yr old female who presents via Virtual Video Visit for Follow Up and Medication Management.  PPHx is significant for Anxiety, Panic Attacks, and Polysubstance Abuse (Heroin, Cocaine, EtOH), currently on Methadone at [facility], no Suicide Attempts nor Self Injurious Behavior, and 1 prior hospitalization (2015 [address]).   Briteny has continued to do well without having further panic attacks.  We will not make any changes to her medication at this time.  Refills were sent in.  She will return follow-up in approximately 2 months.   GAD with Panic Attacks: -Continue Zoloft to 75 mg daily.  45 (50 mg) tablets with 1 refill. -Continue Atarax 10 mg TID PRN.  60 tablets with 0 refills.   -On Methadone 30 mg daily.    Collaboration of Care:   Patient/Guardian was advised Release of Information must be obtained prior to any record release in order to collaborate  their care with an outside provider. Patient/Guardian was advised if they have not already done so to contact the registration department to sign all necessary forms in order for [place] to release information regarding their care.   Consent: Patient/Guardian gives verbal consent for treatment and assignment of benefits for services provided during this visit. Patient/Guardian expressed understanding and agreed to proceed.    Lauro Franklin, MD 02/23/2023, 5:07 PM   Follow Up Instructions:    I discussed the assessment and treatment plan with the patient. The patient was provided an opportunity to ask questions and all were answered. The patient agreed with the plan and demonstrated an understanding of the instructions.   The patient was advised to call back or seek an in-person evaluation if the symptoms worsen or if the condition fails to improve as anticipated.  I provided 10 minutes of non-face-to-face time during this encounter.   Lauro Franklin, MD

## 2023-02-27 ENCOUNTER — Encounter: Payer: Self-pay | Admitting: Student

## 2023-03-23 ENCOUNTER — Encounter: Payer: Medicare Other | Admitting: Student

## 2023-04-28 ENCOUNTER — Ambulatory Visit (INDEPENDENT_AMBULATORY_CARE_PROVIDER_SITE_OTHER): Payer: Medicare Other | Admitting: Student in an Organized Health Care Education/Training Program

## 2023-04-28 ENCOUNTER — Encounter (HOSPITAL_COMMUNITY): Payer: Self-pay | Admitting: Student in an Organized Health Care Education/Training Program

## 2023-04-28 VITALS — BP 133/88 | HR 90 | Ht 64.0 in | Wt 190.2 lb

## 2023-04-28 DIAGNOSIS — F410 Panic disorder [episodic paroxysmal anxiety] without agoraphobia: Secondary | ICD-10-CM

## 2023-04-28 DIAGNOSIS — F411 Generalized anxiety disorder: Secondary | ICD-10-CM

## 2023-04-28 MED ORDER — HYDROXYZINE HCL 10 MG PO TABS: 10.0000 mg | ORAL_TABLET | Freq: Three times a day (TID) | ORAL | 0 refills | Status: DC | PRN

## 2023-04-28 MED ORDER — SERTRALINE HCL 50 MG PO TABS: 75.0000 mg | ORAL_TABLET | Freq: Every day | ORAL | 1 refills | Status: DC

## 2023-04-28 NOTE — Progress Notes (Cosign Needed Addendum)
BH MD/PA/NP OP Progress Note    04/28/2023 8:15 AM Paula Leon  MRN:  [id number]  Chief Complaint:  Chief Complaint  Patient presents with   Follow-up   Medication Refill   HPI:  Paula Leon is a 57 yr old female who presents for Follow Up and Medication Management.  PPHx is significant for Anxiety, Panic Attacks, and Polysubstance Abuse (Heroin, Cocaine, EtOH), currently on Methadone at [facility], no Suicide Attempts nor Self Injurious Behavior, and 1 prior hospitalization (2015 [address]).     She reports that she has continued to do well since her last appointment.  She reports having the occasional low day but that they are so much better than they had been and rare.  She reports overall feeling much better.  Discussed with her that we would not make any changes to her medications and she was agreeable.  She reports no SI, HI, or AVH.  She reports her sleep is good.  She reports her appetite is good.  She reports no other concerns at present.  She will return for follow up in approximately 2 months.    Visit Diagnosis:    ICD-10-CM   1. Generalized anxiety disorder with panic attacks  F41.1 hydrOXYzine (ATARAX) 10 MG tablet   F41.0 sertraline (ZOLOFT) 50 MG tablet       Past Psychiatric History: Anxiety, Panic Attacks, and Polysubstance Abuse (Heroin, Cocaine, EtOH), currently on Methadone at [facility], no Suicide Attempts nor Self Injurious Behavior, and 1 prior hospitalization (2015 [address]).    Past Medical History:  Past Medical History:  Diagnosis Date   Anemia    IDA   Depression    Hernia of abdominal wall    History of drug abuse (HCC)    has not used in ten years   Substance abuse (HCC)    2012 QUIT Heroin   Transfusion history    due to anemia with hb 4.0    Past Surgical History:  Procedure Laterality Date   INSERTION OF MESH N/A 04/19/2019   Procedure: INSERTION OF MESH;  Surgeon: Darnell Level, MD;  Location: [facility];  Service: General;  Laterality: N/A;   SALPINGECTOMY Left    due to ruptured ectopic   TOOTH EXTRACTION N/A 11/30/2022   Procedure: DENTAL RESTORATION/EXTRACTIONS;  Surgeon: Ocie Doyne, DMD;  Location: [facility];  Service: Oral Surgery;  Laterality: N/A;   VENTRAL HERNIA REPAIR N/A 04/19/2019   Procedure: VENTRAL HERNIA REPAIR;  Surgeon: Darnell Level, MD;  Location: [facility];  Service: General;  Laterality: N/A;    Family Psychiatric History: Multiple Maternal Members- EtOH abuse Maternal Aunt- Anxiety  Family History:  Family History  Problem Relation Age of Onset   Cancer Mother    Cancer Father    Colon cancer Neg Hx    Colon polyps Neg Hx    Esophageal cancer Neg Hx    Rectal cancer Neg Hx    Stomach cancer Neg Hx     Social History:  Social History   Socioeconomic History   Marital status: Single    Spouse name: Not on file   Number of children: Not on file   Years of education: Not on file   Highest education level: Not on file  Occupational History   Not on file  Tobacco Use   Smoking status: Every Day    Current packs/day: 0.25    Types: Cigarettes   Smokeless tobacco: Never   Tobacco comments:  4-5  cigarettes per day.   Wants to stop  Vaping Use   Vaping status: Never Used  Substance and Sexual Activity   Alcohol use: Not Currently    Comment: 2012   Drug use: Not Currently    Types: Heroin, Marijuana    Comment: last time used drugs was 2012   Sexual activity: Not Currently    Birth control/protection: Surgical  Other Topics Concern   Not on file  Social History Narrative   Not on file   Social Determinants of Health   Financial Resource Strain: Medium Risk (02/25/2022)   Overall Financial Resource Strain (CARDIA)    Difficulty of Paying Living Expenses: Somewhat hard  Food Insecurity: Food Insecurity Present (02/25/2022)   Hunger Vital Sign    Worried About Running Out of Food in the Last Year: Often true    Ran  Out of Food in the Last Year: Often true  Transportation Needs: No Transportation Needs (02/25/2022)   PRAPARE - Administrator, Civil Service (Medical): No    Lack of Transportation (Non-Medical): No  Physical Activity: Insufficiently Active (02/25/2022)   Exercise Vital Sign    Days of Exercise per Week: 7 days    Minutes of Exercise per Session: 20 min  Stress: Stress Concern Present (02/25/2022)   [employer] of Occupational Health - Occupational Stress Questionnaire    Feeling of Stress : Very much  Social Connections: Moderately Isolated (02/25/2022)   Social Connection and Isolation Panel [NHANES]    Frequency of Communication with Friends and Family: More than three times a week    Frequency of Social Gatherings with Friends and Family: More than three times a week    Attends Religious Services: More than 4 times per year    Active Member of Clubs or Organizations: No    Attends Banker Meetings: Never    Marital Status: Never married    Allergies:  No Known Allergies   Metabolic Disorder Labs: Lab Results  Component Value Date   HGBA1C 5.4 02/27/2022   MPG 108.28 02/27/2022   No results found for: "PROLACTIN" Lab Results  Component Value Date   CHOL 137 02/27/2022   TRIG 77 02/27/2022   HDL 46 02/27/2022   CHOLHDL 3.0 02/27/2022   VLDL 15 02/27/2022   LDLCALC 76 02/27/2022   Lab Results  Component Value Date   TSH 1.226 02/27/2022   TSH 1.544 02/23/2022    Therapeutic Level Labs: No results found for: "LITHIUM" No results found for: "VALPROATE" No results found for: "CBMZ"  Current Medications: Current Outpatient Medications  Medication Sig Dispense Refill   ferrous sulfate 325 (65 FE) MG tablet Take 1 tablet (325 mg total) by mouth daily with breakfast. (Patient not taking: Reported on 11/28/2022) 90 tablet 2   hydrOXYzine (ATARAX) 10 MG tablet Take 1 tablet (10 mg total) by mouth every 8 (eight) hours as needed. 60 tablet 0    methadone (DOLOPHINE) 10 MG/5ML solution Take 30 mg by mouth every morning.     sertraline (ZOLOFT) 50 MG tablet Take 1.5 tablets (75 mg total) by mouth daily. 45 tablet 1   No current facility-administered medications for this visit.     Musculoskeletal: Strength & Muscle Tone: within normal limits Gait & Station: normal Patient leans: N/A  Psychiatric Specialty Exam: Review of Systems  Respiratory:  Negative for shortness of breath.   Cardiovascular:  Negative for chest pain.  Gastrointestinal:  Negative for abdominal pain, constipation, diarrhea,  nausea and vomiting.  Neurological:  Negative for dizziness, weakness and headaches.  Psychiatric/Behavioral:  Negative for dysphoric mood, hallucinations, sleep disturbance and suicidal ideas. The patient is not nervous/anxious.     Blood pressure 133/88, pulse 90, height 5\' 4"  (1.626 m), weight 190 lb 3.2 oz (86.3 kg), SpO2 100%.Body mass index is 32.65 kg/m.  General Appearance: Casual and Fairly Groomed  Eye Contact:  Good  Speech:  Clear and Coherent and Normal Rate  Volume:  Normal  Mood:  Euthymic  Affect:  Appropriate and Congruent  Thought Process:  Coherent and Goal Directed  Orientation:  Full (Time, Place, and Person)  Thought Content: WDL and Logical   Suicidal Thoughts:  No  Homicidal Thoughts:  No  Memory:  Immediate;   Good Recent;   Good  Judgement:  Good  Insight:  Good  Psychomotor Activity:  Normal  Concentration:  Concentration: Good and Attention Span: Good  Recall:  Good  Fund of Knowledge: Good  Language: Good  Akathisia:  Negative  Handed:  Right  AIMS (if indicated): not done  Assets:  Communication Skills Desire for Improvement Resilience Social Support  ADL's:  Intact  Cognition: WNL  Sleep:  Good   Screenings: GAD-7    Flowsheet Row Clinical Support from 05/17/2022 in [facility] Counselor from 04/11/2022 in [facility] Office Visit  from 04/07/2022 in [facility] Office Visit from 02/25/2022 in [facility] Office Visit from 10/28/2021 in [facility]  Total GAD-7 Score 17 21 18 20 10       PHQ2-9    Flowsheet Row Clinical Support from 05/17/2022 in [facility] Most recent reading at 05/17/2022  3:42 PM Counselor from 04/11/2022 in [facility] Most recent reading at 04/11/2022  9:07 AM Office Visit from 04/07/2022 in [facility] Most recent reading at 04/07/2022  4:20 PM Office Visit from 02/25/2022 in [facility] Most recent reading at 02/25/2022 11:08 AM Office Visit from 02/25/2022 in [facility] Most recent reading at 02/25/2022 10:46 AM  PHQ-2 Total Score 5 0 0 0 0  PHQ-9 Total Score 7 -- -- 0 --      Flowsheet Row Admission (Discharged) from 11/30/2022 in [facility] Most recent reading at 11/30/2022  9:09 AM Office Visit from 05/05/2022 in [facility] Most recent reading at 05/07/2022  2:18 PM ED from 05/06/2022 in [facility] Emergency Department at [facility] Most recent reading at 05/07/2022 12:02 AM  C-SSRS RISK CATEGORY No Risk No Risk No Risk        Assessment and Plan:  Paula Leon is a 57 yr old female who presents for Follow Up and Medication Management.  PPHx is significant for Anxiety, Panic Attacks, and Polysubstance Abuse (Heroin, Cocaine, EtOH), currently on Methadone at [facility], no Suicide Attempts nor Self Injurious Behavior, and 1 prior hospitalization (2015 [address]).   Kaleena has continued to do well on her regiment.  We will not make any changes to her medications at this time.  Refills were sent in.   She will return for follow up in approximately 2 months.   GAD with Panic Attacks: -Continue Zoloft 75 mg daily.  45 (50 mg) tablets  with 1 refill. -Continue Atarax 10 mg TID PRN.  60 tablets with 0 refills.   -On Methadone  29 mg daily.    Collaboration of Care:   Patient/Guardian was advised Release of Information must be obtained prior to any record release in order to collaborate their care with an outside provider. Patient/Guardian was advised if they have not already done so to contact the registration department to sign all necessary forms in order for [place] to release information regarding their care.   Consent: Patient/Guardian gives verbal consent for treatment and assignment of benefits for services provided during this visit. Patient/Guardian expressed understanding and agreed to proceed.    Lauro Franklin, MD 04/28/2023, 8:15 AM

## 2023-06-16 ENCOUNTER — Ambulatory Visit (INDEPENDENT_AMBULATORY_CARE_PROVIDER_SITE_OTHER): Payer: Medicare Other | Admitting: Student in an Organized Health Care Education/Training Program

## 2023-06-16 ENCOUNTER — Encounter (HOSPITAL_COMMUNITY): Payer: Self-pay | Admitting: Student in an Organized Health Care Education/Training Program

## 2023-06-16 DIAGNOSIS — F410 Panic disorder [episodic paroxysmal anxiety] without agoraphobia: Secondary | ICD-10-CM

## 2023-06-16 DIAGNOSIS — F411 Generalized anxiety disorder: Secondary | ICD-10-CM | POA: Diagnosis not present

## 2023-06-16 MED ORDER — SERTRALINE HCL 50 MG PO TABS: 75.0000 mg | ORAL_TABLET | Freq: Every day | ORAL | 2 refills | Status: DC

## 2023-06-16 MED ORDER — HYDROXYZINE HCL 10 MG PO TABS: 10.0000 mg | ORAL_TABLET | Freq: Three times a day (TID) | ORAL | 0 refills | Status: DC | PRN

## 2023-06-16 NOTE — Progress Notes (Signed)
BH MD/PA/NP OP Progress Note    06/16/2023 8:18 AM [name]  MRN:  [id number]  Chief Complaint:  Chief Complaint  Patient presents with   Follow-up   Medication Refill   HPI:  Paula Leon is a 57 yr old female who presents for Follow Up and Medication Management.  PPHx is significant for Anxiety, Panic Attacks, and Polysubstance Abuse (Heroin, Cocaine, EtOH), currently on Methadone at [facility], no Suicide Attempts nor Self Injurious Behavior, and 1 prior hospitalization (2015 [address]).     She reports things have remained about the same since her last appointment.  She reports her anxiety has remained stable.  She reports very few spikes in anxiety and has only needed to use the hydroxyzine a couple of times since her last appointment.  She reports her grandkids are keeping her busy with taking them to and picking them up from school cooking and cleaning and that she is doing really well.  Discussed with her that we would not make any changes to her medications at this time and she was agreeable.  She reports no SI, HI, or AVH.  She reports her sleep is good.  She reports her appetite is doing good.  She reports no side effects to her medications.  She reports no other concerns at present.  Return for follow-up in approximately 3 months.   Visit Diagnosis:    ICD-10-CM   1. Generalized anxiety disorder with panic attacks  F41.1 sertraline (ZOLOFT) 50 MG tablet   F41.0 hydrOXYzine (ATARAX) 10 MG tablet        Past Psychiatric History: Anxiety, Panic Attacks, and Polysubstance Abuse (Heroin, Cocaine, EtOH), currently on Methadone at [facility], no Suicide Attempts nor Self Injurious Behavior, and 1 prior hospitalization (2015 [address]).    Past Medical History:  Past Medical History:  Diagnosis Date   Anemia    IDA   Depression    Hernia of abdominal wall    History of drug abuse (HCC)    has not used in ten years   Substance abuse (HCC)     2012 QUIT Heroin   Transfusion history    due to anemia with hb 4.0    Past Surgical History:  Procedure Laterality Date   INSERTION OF MESH N/A 04/19/2019   Procedure: INSERTION OF MESH;  Surgeon: Darnell Level, MD;  Location: [facility];  Service: General;  Laterality: N/A;   SALPINGECTOMY Left    due to ruptured ectopic   TOOTH EXTRACTION N/A 11/30/2022   Procedure: DENTAL RESTORATION/EXTRACTIONS;  Surgeon: Ocie Doyne, DMD;  Location: [facility];  Service: Oral Surgery;  Laterality: N/A;   VENTRAL HERNIA REPAIR N/A 04/19/2019   Procedure: VENTRAL HERNIA REPAIR;  Surgeon: Darnell Level, MD;  Location: [facility];  Service: General;  Laterality: N/A;    Family Psychiatric History: Multiple Maternal Members- EtOH abuse Maternal Aunt- Anxiety  Family History:  Family History  Problem Relation Age of Onset   Cancer Mother    Cancer Father    Colon cancer Neg Hx    Colon polyps Neg Hx    Esophageal cancer Neg Hx    Rectal cancer Neg Hx    Stomach cancer Neg Hx     Social History:  Social History   Socioeconomic History   Marital status: Single    Spouse name: Not on file   Number of children: Not on file   Years of education: Not on file   Highest  education level: Not on file  Occupational History   Not on file  Tobacco Use   Smoking status: Every Day    Current packs/day: 0.25    Types: Cigarettes   Smokeless tobacco: Never   Tobacco comments:    4-5  cigarettes per day.   Wants to stop  Vaping Use   Vaping status: Never Used  Substance and Sexual Activity   Alcohol use: Not Currently    Comment: 2012   Drug use: Not Currently    Types: Heroin, Marijuana    Comment: last time used drugs was 2012   Sexual activity: Not Currently    Birth control/protection: Surgical  Other Topics Concern   Not on file  Social History Narrative   Not on file   Social Determinants of Health   Financial Resource Strain: Medium Risk (02/25/2022)    Overall Financial Resource Strain (CARDIA)    Difficulty of Paying Living Expenses: Somewhat hard  Food Insecurity: Food Insecurity Present (02/25/2022)   Hunger Vital Sign    Worried About Running Out of Food in the Last Year: Often true    Ran Out of Food in the Last Year: Often true  Transportation Needs: No Transportation Needs (02/25/2022)   PRAPARE - Administrator, Civil Service (Medical): No    Lack of Transportation (Non-Medical): No  Physical Activity: Insufficiently Active (02/25/2022)   Exercise Vital Sign    Days of Exercise per Week: 7 days    Minutes of Exercise per Session: 20 min  Stress: Stress Concern Present (02/25/2022)   [employer] of Occupational Health - Occupational Stress Questionnaire    Feeling of Stress : Very much  Social Connections: Moderately Isolated (02/25/2022)   Social Connection and Isolation Panel [NHANES]    Frequency of Communication with Friends and Family: More than three times a week    Frequency of Social Gatherings with Friends and Family: More than three times a week    Attends Religious Services: More than 4 times per year    Active Member of Clubs or Organizations: No    Attends Banker Meetings: Never    Marital Status: Never married    Allergies:  No Known Allergies   Metabolic Disorder Labs: Lab Results  Component Value Date   HGBA1C 5.4 02/27/2022   MPG 108.28 02/27/2022   No results found for: "PROLACTIN" Lab Results  Component Value Date   CHOL 137 02/27/2022   TRIG 77 02/27/2022   HDL 46 02/27/2022   CHOLHDL 3.0 02/27/2022   VLDL 15 02/27/2022   LDLCALC 76 02/27/2022   Lab Results  Component Value Date   TSH 1.226 02/27/2022   TSH 1.544 02/23/2022    Therapeutic Level Labs: No results found for: "LITHIUM" No results found for: "VALPROATE" No results found for: "CBMZ"  Current Medications: Current Outpatient Medications  Medication Sig Dispense Refill   ferrous sulfate 325 (65 FE)  MG tablet Take 1 tablet (325 mg total) by mouth daily with breakfast. (Patient not taking: Reported on 11/28/2022) 90 tablet 2   hydrOXYzine (ATARAX) 10 MG tablet Take 1 tablet (10 mg total) by mouth every 8 (eight) hours as needed. 60 tablet 0   methadone (DOLOPHINE) 10 MG/5ML solution Take 30 mg by mouth every morning.     sertraline (ZOLOFT) 50 MG tablet Take 1.5 tablets (75 mg total) by mouth daily. 45 tablet 2   No current facility-administered medications for this visit.     Musculoskeletal: Strength &  Muscle Tone: within normal limits Gait & Station: normal Patient leans: N/A  Psychiatric Specialty Exam: Review of Systems  Respiratory:  Negative for shortness of breath.   Cardiovascular:  Negative for chest pain.  Gastrointestinal:  Negative for abdominal pain, constipation, diarrhea, nausea and vomiting.  Neurological:  Negative for dizziness, weakness and headaches.  Psychiatric/Behavioral:  Negative for dysphoric mood, hallucinations, sleep disturbance and suicidal ideas. The patient is not nervous/anxious.     Blood pressure (!) 130/90, pulse 80, height 5\' 4"  (1.626 m), weight 198 lb 6.4 oz (90 kg), SpO2 99%.Body mass index is 34.06 kg/m.  General Appearance: Casual and Fairly Groomed  Eye Contact:  Good  Speech:  Clear and Coherent and Normal Rate  Volume:  Normal  Mood:  Euthymic  Affect:  Appropriate and Congruent  Thought Process:  Coherent and Goal Directed  Orientation:  Full (Time, Place, and Person)  Thought Content: WDL and Logical   Suicidal Thoughts:  No  Homicidal Thoughts:  No  Memory:  Immediate;   Good Recent;   Good  Judgement:  Good  Insight:  Good  Psychomotor Activity:  Normal  Concentration:  Concentration: Good and Attention Span: Good  Recall:  Good  Fund of Knowledge: Good  Language: Good  Akathisia:  Negative  Handed:  Right  AIMS (if indicated): not done  Assets:  Communication Skills Desire for Improvement Resilience Social Support   ADL's:  Intact  Cognition: WNL  Sleep:  Good   Screenings: GAD-7    Flowsheet Row Clinical Support from 05/17/2022 in [facility] Counselor from 04/11/2022 in [facility] Office Visit from 04/07/2022 in [facility] Office Visit from 02/25/2022 in [facility] Office Visit from 10/28/2021 in [facility]  Total GAD-7 Score 17 21 18 20 10       PHQ2-9    Flowsheet Row Clinical Support from 05/17/2022 in [facility] Most recent reading at 05/17/2022  3:42 PM Counselor from 04/11/2022 in [facility] Most recent reading at 04/11/2022  9:07 AM Office Visit from 04/07/2022 in [facility] Most recent reading at 04/07/2022  4:20 PM Office Visit from 02/25/2022 in [facility] Most recent reading at 02/25/2022 11:08 AM Office Visit from 02/25/2022 in [facility] Most recent reading at 02/25/2022 10:46 AM  PHQ-2 Total Score 5 0 0 0 0  PHQ-9 Total Score 7 -- -- 0 --      Flowsheet Row Admission (Discharged) from 11/30/2022 in [facility] Most recent reading at 11/30/2022  9:09 AM Office Visit from 05/05/2022 in [facility] Most recent reading at 05/07/2022  2:18 PM ED from 05/06/2022 in [facility] Emergency Department at [facility] Most recent reading at 05/07/2022 12:02 AM  C-SSRS RISK CATEGORY No Risk No Risk No Risk        Assessment and Plan:  Kashira Danna is a 57 yr old female who presents for Follow Up and Medication Management.  PPHx is significant for Anxiety, Panic Attacks, and Polysubstance Abuse (Heroin, Cocaine, EtOH), currently on Methadone at [facility], no Suicide Attempts nor Self Injurious Behavior, and 1 prior hospitalization (2015 [address]).   Terrilyn has continued to do  well on her medication regimen and only sparingly used the hydroxyzine.  We will not make any changes to her medication at this time.  Refills were sent in.  She will return for follow-up in approximately 3 months.   GAD with Panic Attacks: -Continue Zoloft 75 mg daily.  45 (50 mg) tablets with 2 refills. -Continue Atarax 10 mg TID PRN.  60 tablets with 0 refills.   -On Methadone 29 mg daily.    Collaboration of Care:   Patient/Guardian was advised Release of Information must be obtained prior to any record release in order to collaborate their care with an outside provider. Patient/Guardian was advised if they have not already done so to contact the registration department to sign all necessary forms in order for [place] to release information regarding their care.   Consent: Patient/Guardian gives verbal consent for treatment and assignment of benefits for services provided during this visit. Patient/Guardian expressed understanding and agreed to proceed.    Lauro Franklin, MD 06/16/2023, 8:18 AM

## 2023-09-04 ENCOUNTER — Ambulatory Visit (HOSPITAL_COMMUNITY)
Admission: EM | Admit: 2023-09-04 | Discharge: 2023-09-04 | Disposition: A | Discharge status: Home / Self Care | Payer: Medicare Other | Attending: Emergency Medicine | Admitting: Emergency Medicine

## 2023-09-04 ENCOUNTER — Encounter (HOSPITAL_COMMUNITY): Payer: Self-pay

## 2023-09-04 ENCOUNTER — Ambulatory Visit (INDEPENDENT_AMBULATORY_CARE_PROVIDER_SITE_OTHER): Payer: Medicare Other

## 2023-09-04 DIAGNOSIS — M109 Gout, unspecified: Secondary | ICD-10-CM

## 2023-09-04 MED ORDER — PREDNISONE 20 MG PO TABS: 40.0000 mg | ORAL_TABLET | Freq: Every day | ORAL | 0 refills | Status: AC

## 2023-09-04 NOTE — Discharge Instructions (Addendum)
I do not see an abnormality on your xray. I will call you if the radiologist sees something different (which may take several hours)  For pain and inflammation, take the prednisone as prescribed. Once daily for 5 days. Elevate the leg to reduce swelling. Wear supportive shoes, and try inserts for further support.   Keep appointment with your primary care provider for follow up!

## 2023-09-04 NOTE — ED Triage Notes (Signed)
Patient here today with c/o left foot pain. No known injury. Patient states that she has some redness around the base of her big toe. Tender to touch. Increased pain with weightbearing. No h/o gout. Patient walks a lot at work.

## 2023-09-04 NOTE — ED Provider Notes (Signed)
[facility]    CSN: [id number] Arrival date & time: 09/04/23  1449     History   Chief Complaint Chief Complaint  Patient presents with   Foot Pain    HPI Paula Leon is a 57 y.o. female.  3 day history of left great toe pain Rating 8/10, worse with weight bearing She denies known injury or trauma No history of gout  She tried epsom soak and topical pain cream with relief Has been on her feet a lot at work Doesn't think her shoes are supportive enough   Past Medical History:  Diagnosis Date   Anemia    IDA   Depression    Hernia of abdominal wall    History of drug abuse (HCC)    has not used in ten years   Substance abuse (HCC)    2012 QUIT Heroin   Transfusion history    due to anemia with hb 4.0    Patient Active Problem List   Diagnosis Date Noted   Palpitations 02/25/2022   Constipation 10/29/2021   Patellofemoral syndrome of left knee 06/11/2021   Carpal tunnel syndrome on both sides 06/11/2021   Chronic pain of both knees 10/23/2020   Anemia    Tobacco user 06/16/2020   Heavy menstrual bleeding 06/16/2020   Generalized anxiety disorder 06/16/2020   Iron deficiency anemia due to chronic blood loss 06/16/2020   Healthcare maintenance 06/16/2020   Ventral hernia 04/14/2019   Abnormal uterine bleeding 01/25/2018   Morbid obesity (HCC) 03/27/2014   Opioid dependence (HCC) 03/27/2014    Past Surgical History:  Procedure Laterality Date   INSERTION OF MESH N/A 04/19/2019   Procedure: INSERTION OF MESH;  Surgeon: Darnell Level, MD;  Location: [facility];  Service: General;  Laterality: N/A;   SALPINGECTOMY Left    due to ruptured ectopic   TOOTH EXTRACTION N/A 11/30/2022   Procedure: DENTAL RESTORATION/EXTRACTIONS;  Surgeon: Ocie Doyne, DMD;  Location: [facility];  Service: Oral Surgery;  Laterality: N/A;   VENTRAL HERNIA REPAIR N/A 04/19/2019   Procedure: VENTRAL HERNIA REPAIR;  Surgeon: Darnell Level, MD;  Location: [facility];  Service: General;  Laterality: N/A;    OB History     Gravida  10   Para  5   Term  5   Preterm  0   AB  5   Living  4      SAB  2   IAB  3   Ectopic  0   Multiple  0   Live Births  5            Home Medications    Prior to Admission medications   Medication Sig Start Date End Date Taking? Authorizing Provider  predniSONE (DELTASONE) 20 MG tablet Take 2 tablets (40 mg total) by mouth daily with breakfast for 5 days. 09/04/23 09/09/23 Yes Nicol Herbig, Lurena Joiner, PA-C  ferrous sulfate 325 (65 FE) MG tablet Take 1 tablet (325 mg total) by mouth daily with breakfast. 03/07/22   Andrey Campanile, MD  hydrOXYzine (ATARAX) 10 MG tablet Take 1 tablet (10 mg total) by mouth every 8 (eight) hours as needed. 06/16/23   Lauro Franklin, MD  methadone (DOLOPHINE) 10 MG/5ML solution Take 30 mg by mouth every morning.    [provider]  sertraline (ZOLOFT) 50 MG tablet Take 1.5 tablets (75 mg total) by mouth daily. 06/16/23   Lauro Franklin, MD    Family History Family  History  Problem Relation Age of Onset   Cancer Mother    Cancer Father    Colon cancer Neg Hx    Colon polyps Neg Hx    Esophageal cancer Neg Hx    Rectal cancer Neg Hx    Stomach cancer Neg Hx     Social History Social History   Tobacco Use   Smoking status: Every Day    Current packs/day: 0.25    Types: Cigarettes   Smokeless tobacco: Never   Tobacco comments:    4-5  cigarettes per day.   Wants to stop  Vaping Use   Vaping status: Never Used  Substance Use Topics   Alcohol use: Not Currently    Comment: 2012   Drug use: Not Currently    Types: Heroin, Marijuana    Comment: last time used drugs was 2012     Allergies   Patient has no known allergies.   Review of Systems Review of Systems Per HPI  Physical Exam Triage Vital Signs ED Triage Vitals  Encounter Vitals Group     BP 09/04/23 1658 120/83     Systolic BP Percentile --      Diastolic  BP Percentile --      Pulse Rate 09/04/23 1658 80     Resp 09/04/23 1658 16     Temp 09/04/23 1658 98.1 F (36.7 C)     Temp Source 09/04/23 1658 Oral     SpO2 09/04/23 1658 96 %     Weight 09/04/23 1657 170 lb (77.1 kg)     Height 09/04/23 1657 5\' 4"  (1.626 m)     Head Circumference --      Peak Flow --      Pain Score 09/04/23 1657 8     Pain Loc --      Pain Education --      Exclude from Growth Chart --    No data found.  Updated Vital Signs BP 120/83 (BP Location: Left Arm)   Pulse 80   Temp 98.1 F (36.7 C) (Oral)   Resp 16   Ht 5\' 4"  (1.626 m)   Wt 170 lb (77.1 kg)   LMP 07/01/2023 (Approximate)   SpO2 96%   BMI 29.18 kg/m    Physical Exam Vitals and nursing note reviewed.  Constitutional:      General: She is not in acute distress. Cardiovascular:     Rate and Rhythm: Normal rate and regular rhythm.     Pulses: Normal pulses.  Pulmonary:     Effort: Pulmonary effort is normal.  Musculoskeletal:        General: Tenderness present.     Cervical back: Normal range of motion.     Comments: Left great toe tender to touch. No obvious swelling, erythema, deformity. No ankle pain, normal ROM. Distal sensation intact. Strong DP pulse. Cap refill < 2 seconds  Skin:    General: Skin is warm and dry.     Capillary Refill: Capillary refill takes less than 2 seconds.  Neurological:     Mental Status: She is alert and oriented to person, place, and time.     UC Treatments / Results  Labs (all labs ordered are listed, but only abnormal results are displayed) Labs Reviewed - No data to display  EKG  Radiology DG Foot Complete Left  Result Date: 09/04/2023 CLINICAL DATA:  Pain in the left great toe.  No injury. EXAM: LEFT FOOT - COMPLETE 3+ VIEW COMPARISON:  None Available. FINDINGS: There is no acute fracture or dislocation. There is mild hallux valgus. The soft tissues are unremarkable. IMPRESSION: 1. No acute fracture or dislocation. 2. Mild hallux valgus.  Electronically Signed   By: Elgie Collard M.D.   On: 09/04/2023 19:04    Procedures Procedures  Medications Ordered in UC Medications - No data to display  Initial Impression / Assessment and Plan / UC Course  I have reviewed the triage vital signs and the nursing notes.  Pertinent labs & imaging results that were available during my care of the patient were reviewed by me and considered in my medical decision making (see chart for details).  Suspect gout. Discussed with patient. Offered xray to rule out other bony abnormality, patient would like to proceed with this. No acute abnormality on xray. Images independently reviewed by me, agree with radiology interpretation. Prednisone 40 mg daily x 5 days. Other symptomatic care She has PCP visit next week, advised follow up regarding symptoms Patient agreeable to plan  Final Clinical Impressions(s) / UC Diagnoses   Final diagnoses:  Acute gout involving toe of left foot, unspecified cause     Discharge Instructions      I do not see an abnormality on your xray. I will call you if the radiologist sees something different (which may take several hours)  For pain and inflammation, take the prednisone as prescribed. Once daily for 5 days. Elevate the leg to reduce swelling. Wear supportive shoes, and try inserts for further support.   Keep appointment with your primary care provider for follow up!     ED Prescriptions     Medication Sig Dispense Auth. Provider   predniSONE (DELTASONE) 20 MG tablet Take 2 tablets (40 mg total) by mouth daily with breakfast for 5 days. 10 tablet Sohana Austell, Lurena Joiner, PA-C      I have reviewed the PDMP during this encounter.   Kathrine Haddock 09/04/23 1919

## 2023-09-08 ENCOUNTER — Encounter (HOSPITAL_COMMUNITY): Payer: Medicare Other | Admitting: Student in an Organized Health Care Education/Training Program

## 2023-09-08 NOTE — Progress Notes (Unsigned)
BH MD/PA/NP OP Progress Note    09/08/2023 7:30 AM Paula Leon  MRN:  [id number]  Chief Complaint:  No chief complaint on file.  HPI:  Paula Leon is a 57 yr old female who presents for Follow Up and Medication Management.  PPHx is significant for Anxiety, Panic Attacks, and Polysubstance Abuse (Heroin, Cocaine, EtOH), currently on Methadone at [facility], no Suicide Attempts nor Self Injurious Behavior, and 1 prior hospitalization (2015 [address]).     She reports ***   Visit Diagnosis:  No diagnosis found.     Past Psychiatric History: Anxiety, Panic Attacks, and Polysubstance Abuse (Heroin, Cocaine, EtOH), currently on Methadone at [facility], no Suicide Attempts nor Self Injurious Behavior, and 1 prior hospitalization (2015 [address]).    Past Medical History:  Past Medical History:  Diagnosis Date   Anemia    IDA   Depression    Hernia of abdominal wall    History of drug abuse (HCC)    has not used in ten years   Substance abuse (HCC)    2012 QUIT Heroin   Transfusion history    due to anemia with hb 4.0    Past Surgical History:  Procedure Laterality Date   INSERTION OF MESH N/A 04/19/2019   Procedure: INSERTION OF MESH;  Surgeon: Darnell Level, MD;  Location: [facility];  Service: General;  Laterality: N/A;   SALPINGECTOMY Left    due to ruptured ectopic   TOOTH EXTRACTION N/A 11/30/2022   Procedure: DENTAL RESTORATION/EXTRACTIONS;  Surgeon: Ocie Doyne, DMD;  Location: [facility];  Service: Oral Surgery;  Laterality: N/A;   VENTRAL HERNIA REPAIR N/A 04/19/2019   Procedure: VENTRAL HERNIA REPAIR;  Surgeon: Darnell Level, MD;  Location: [facility];  Service: General;  Laterality: N/A;    Family Psychiatric History: Multiple Maternal Members- EtOH abuse Maternal Aunt- Anxiety  Family History:  Family History  Problem Relation Age of Onset   Cancer Mother    Cancer Father    Colon cancer Neg Hx    Colon  polyps Neg Hx    Esophageal cancer Neg Hx    Rectal cancer Neg Hx    Stomach cancer Neg Hx     Social History:  Social History   Socioeconomic History   Marital status: Single    Spouse name: Not on file   Number of children: Not on file   Years of education: Not on file   Highest education level: Not on file  Occupational History   Not on file  Tobacco Use   Smoking status: Every Day    Current packs/day: 0.25    Types: Cigarettes   Smokeless tobacco: Never   Tobacco comments:    4-5  cigarettes per day.   Wants to stop  Vaping Use   Vaping status: Never Used  Substance and Sexual Activity   Alcohol use: Not Currently    Comment: 2012   Drug use: Not Currently    Types: Heroin, Marijuana    Comment: last time used drugs was 2012   Sexual activity: Not Currently    Birth control/protection: Surgical  Other Topics Concern   Not on file  Social History Narrative   Not on file   Social Drivers of Health   Financial Resource Strain: Medium Risk (02/25/2022)   Overall Financial Resource Strain (CARDIA)    Difficulty of Paying Living Expenses: Somewhat hard  Food Insecurity: Food Insecurity Present (02/25/2022)   Hunger Vital Sign  Worried About Programme researcher, broadcasting/film/video in the Last Year: Often true    [employer] of Food in the Last Year: Often true  Transportation Needs: No Transportation Needs (02/25/2022)   PRAPARE - Administrator, Civil Service (Medical): No    Lack of Transportation (Non-Medical): No  Physical Activity: Insufficiently Active (02/25/2022)   Exercise Vital Sign    Days of Exercise per Week: 7 days    Minutes of Exercise per Session: 20 min  Stress: Stress Concern Present (02/25/2022)   [employer] of Occupational Health - Occupational Stress Questionnaire    Feeling of Stress : Very much  Social Connections: Moderately Isolated (02/25/2022)   Social Connection and Isolation Panel [NHANES]    Frequency of Communication with Friends and Family:  More than three times a week    Frequency of Social Gatherings with Friends and Family: More than three times a week    Attends Religious Services: More than 4 times per year    Active Member of Clubs or Organizations: No    Attends Banker Meetings: Never    Marital Status: Never married    Allergies:  No Known Allergies   Metabolic Disorder Labs: Lab Results  Component Value Date   HGBA1C 5.4 02/27/2022   MPG 108.28 02/27/2022   No results found for: "PROLACTIN" Lab Results  Component Value Date   CHOL 137 02/27/2022   TRIG 77 02/27/2022   HDL 46 02/27/2022   CHOLHDL 3.0 02/27/2022   VLDL 15 02/27/2022   LDLCALC 76 02/27/2022   Lab Results  Component Value Date   TSH 1.226 02/27/2022   TSH 1.544 02/23/2022    Therapeutic Level Labs: No results found for: "LITHIUM" No results found for: "VALPROATE" No results found for: "CBMZ"  Current Medications: Current Outpatient Medications  Medication Sig Dispense Refill   ferrous sulfate 325 (65 FE) MG tablet Take 1 tablet (325 mg total) by mouth daily with breakfast. 90 tablet 2   hydrOXYzine (ATARAX) 10 MG tablet Take 1 tablet (10 mg total) by mouth every 8 (eight) hours as needed. 60 tablet 0   methadone (DOLOPHINE) 10 MG/5ML solution Take 30 mg by mouth every morning.     predniSONE (DELTASONE) 20 MG tablet Take 2 tablets (40 mg total) by mouth daily with breakfast for 5 days. 10 tablet 0   sertraline (ZOLOFT) 50 MG tablet Take 1.5 tablets (75 mg total) by mouth daily. 45 tablet 2   No current facility-administered medications for this visit.     Musculoskeletal: Strength & Muscle Tone: within normal limits Gait & Station: normal Patient leans: N/A *** Psychiatric Specialty Exam: Review of Systems  Last menstrual period 07/01/2023.There is no height or weight on file to calculate BMI.  General Appearance: Casual and Fairly Groomed  Eye Contact:  Good  Speech:  Clear and Coherent and Normal Rate   Volume:  Normal  Mood:  Euthymic  Affect:  Appropriate and Congruent  Thought Process:  Coherent and Goal Directed  Orientation:  Full (Time, Place, and Person)  Thought Content: WDL and Logical   Suicidal Thoughts:  No  Homicidal Thoughts:  No  Memory:  Immediate;   Good Recent;   Good  Judgement:  Good  Insight:  Good  Psychomotor Activity:  Normal  Concentration:  Concentration: Good and Attention Span: Good  Recall:  Good  Fund of Knowledge: Good  Language: Good  Akathisia:  Negative  Handed:  Right  AIMS (if indicated):  not done  Assets:  Communication Skills Desire for Improvement Resilience Social Support  ADL's:  Intact  Cognition: WNL  Sleep:  Good   Screenings: GAD-7    Flowsheet Row Clinical Support from 05/17/2022 in [facility] Counselor from 04/11/2022 in [facility] Office Visit from 04/07/2022 in [facility] - A Dept Of [place]. [facility] Office Visit from 02/25/2022 in  [facility] - A Dept Of [place]. [facility] Office Visit from 10/28/2021 in [facility] - A Dept Of [place]. [facility]  Total GAD-7 Score 17 21 18 20 10       PHQ2-9    Flowsheet Row Clinical Support from 05/17/2022 in [facility] Most recent reading at 05/17/2022  3:42 PM Counselor from 04/11/2022 in [facility] Most recent reading at 04/11/2022  9:07 AM Office Visit from 04/07/2022 in [facility] - A Dept Of [place]. [facility] Most recent reading at 04/07/2022  4:20 PM Office Visit from 02/25/2022 in [facility] - A Dept Of [place]. [facility] Most recent reading at 02/25/2022 11:08 AM Office Visit from 02/25/2022 in [facility] - A Dept Of [place]. [facility] Most recent reading at 02/25/2022 10:46 AM  PHQ-2 Total Score  5 0 0 0 0  PHQ-9 Total Score 7 -- -- 0 --      Flowsheet Row ED from 09/04/2023 in [facility] at [facility] Admission (Discharged) from 11/30/2022 in [facility] ED from 05/06/2022 in [facility] Emergency Department at [facility]  C-SSRS RISK CATEGORY No Risk No Risk No Risk        Assessment and Plan:  Paula Leon is a 57 yr old female who presents for Follow Up and Medication Management.  PPHx is significant for Anxiety, Panic Attacks, and Polysubstance Abuse (Heroin, Cocaine, EtOH), currently on Methadone at [facility], no Suicide Attempts nor Self Injurious Behavior, and 1 prior hospitalization (2015 [address]).   Darreld Mclean ***   GAD with Panic Attacks: -Continue Zoloft 75 mg daily.  45 (50 mg) tablets with 2 refills. -Continue Atarax 10 mg TID PRN.  60 tablets with 0 refills.   -On Methadone 29 mg daily.    Collaboration of Care:   Patient/Guardian was advised Release of Information must be obtained prior to any record release in order to collaborate their care with an outside provider. Patient/Guardian was advised if they have not already done so to contact the registration department to sign all necessary forms in order for [place] to release information regarding their care.   Consent: Patient/Guardian gives verbal consent for treatment and assignment of benefits for services provided during this visit. Patient/Guardian expressed understanding and agreed to proceed.    Lauro Franklin, MD 09/08/2023, 7:30 AM

## 2023-09-14 ENCOUNTER — Ambulatory Visit: Payer: Medicare Other | Admitting: Student

## 2023-09-14 ENCOUNTER — Encounter: Payer: Medicare Other | Admitting: Student

## 2023-09-14 VITALS — BP 115/89 | HR 81 | Temp 97.9°F | Ht 64.0 in | Wt 204.4 lb

## 2023-09-14 DIAGNOSIS — F1121 Opioid dependence, in remission: Secondary | ICD-10-CM | POA: Diagnosis not present

## 2023-09-14 DIAGNOSIS — Z0000 Encounter for general adult medical examination without abnormal findings: Secondary | ICD-10-CM

## 2023-09-14 DIAGNOSIS — Z1231 Encounter for screening mammogram for malignant neoplasm of breast: Secondary | ICD-10-CM | POA: Insufficient documentation

## 2023-09-14 DIAGNOSIS — M19041 Primary osteoarthritis, right hand: Secondary | ICD-10-CM

## 2023-09-14 DIAGNOSIS — M19042 Primary osteoarthritis, left hand: Secondary | ICD-10-CM | POA: Diagnosis not present

## 2023-09-14 DIAGNOSIS — F411 Generalized anxiety disorder: Secondary | ICD-10-CM | POA: Diagnosis present

## 2023-09-14 MED ORDER — DICLOFENAC SODIUM 1 % EX GEL: 4.0000 g | Freq: Four times a day (QID) | CUTANEOUS | 3 refills | Status: AC

## 2023-09-14 NOTE — Assessment & Plan Note (Signed)
Last seen on 04/07/2022; this patient has longstanding history of generalized anxiety disorder. Patient reports she follows behavioral health twice a month, currently on sertraline 75 mg daily. Reports this medicine is helping anxiety.   - Continue Sertraline 75 mg daily - Continue following up with Behavioral health

## 2023-09-14 NOTE — Assessment & Plan Note (Signed)
Last Mammogram on 07/03/2020 that did not note any evidence of malignancy. She reports she has a lump on her R breast that has been present for several years; denies any changes in size. Denies any pain. No abnormal nipple discharge. Per the PE, No apparent masses or lumps noted bilateral breast, no nipple discharge or nipple retraction noted. Patient then states that she usually notices it prior to her menstrual cycle. She was just concerned, was previously told that it was a cyst and no intervention is needed. Presently, no palpable masses are noted. She is reassured to get a mammogram as she is due for one. \  - MM digital screening

## 2023-09-14 NOTE — Patient Instructions (Signed)
Thank you, Ms.[name] for allowing [place] to provide your care today. Today we discussed:   Hand stiffness ness - Tylenol 1000 mg every 8 hours as needed for pain.  - You can also try OTC ibuprofen as needed for stiffness - I also sent Voltaren gel to the pharmacy      I sent a referral for mammogram for you.   I have ordered the following labs for you:  Lab Orders  No laboratory test(s) ordered today     Tests ordered today:  None  Referrals ordered today:   Referral Orders  No referral(s) requested today     I have ordered the following medication/changed the following medications:   Stop the following medications: There are no discontinued medications.   Start the following medications: Meds ordered this encounter  Medications   diclofenac Sodium (VOLTAREN ARTHRITIS PAIN) 1 % GEL    Sig: Apply 4 g topically 4 (four) times daily.    Dispense:  2 g    Refill:  3     Follow up: 3 months    Remember:   Should you have any questions or concerns please call the internal medicine clinic at [phone].     Jeral Pinch, DO [facility]

## 2023-09-14 NOTE — Assessment & Plan Note (Signed)
Patient reports that since she started working at [employer] 4 weeks ago, she is noticing symptoms of bilateral hand pain that is worse at night. States that she wakes up in the morning feeling that her hands her stiff and occasionally swollen. Reports it gets better right away as she starts moving and eats food. Per physical exam, no tenderness or swelling noted at the DIP/PIP/MTP/wrist joints. Negative Phalen's Test and Tinel's test. Reports somewhat numbness in her Left 3 digit. However, states that her symptoms improved as she did the Phalen's test. 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 Her symptoms are more concurrent with Osteoarthritis as the stiffness is improving with exercise.   Plan: - Use OTC Tylenol and NSAID as needed for stiffness - Use topical Voltaren gel as needed.  - Exercise and stretch to improve the stiffness

## 2023-09-14 NOTE — Assessment & Plan Note (Signed)
Per the last office note, she was following methadone clinic, currently on 29 mg. Reports she follows with them every Thursday. States she has not taken any street opioids over 12 years. She is regularly going to [employer] Takes Miralax daily for BM. Otherwise no concerns at this time  - Continue following with Methadone clinic

## 2023-09-14 NOTE — Progress Notes (Signed)
Established Patient Office Visit  Subjective   Patient ID: Paula Leon, female    DOB: [birth year]  Age: 57 y.o. MRN: [id number]  Chief Complaint  Patient presents with   Annual Exam    HPI  This is a 57 year old Female living with a history stated below and presents today for lump under her right arm. Please see problem based assessment and plan for additional details.    Patient Active Problem List   Diagnosis Date Noted   Encounter for screening mammogram for breast cancer 09/14/2023   Osteoarthritis of both hands 09/14/2023   Palpitations 02/25/2022   Constipation 10/29/2021   Patellofemoral syndrome of left knee 06/11/2021   Carpal tunnel syndrome on both sides 06/11/2021   Chronic pain of both knees 10/23/2020   Anemia    Tobacco user 06/16/2020   Heavy menstrual bleeding 06/16/2020   Generalized anxiety disorder 06/16/2020   Iron deficiency anemia due to chronic blood loss 06/16/2020   Healthcare maintenance 06/16/2020   Ventral hernia 04/14/2019   Abnormal uterine bleeding 01/25/2018   Morbid obesity (HCC) 03/27/2014   Opioid dependence (HCC) 03/27/2014   Past Medical History:  Diagnosis Date   Anemia    IDA   Depression    Hernia of abdominal wall    History of drug abuse (HCC)    has not used in ten years   Substance abuse (HCC)    2012 QUIT Heroin   Transfusion history    due to anemia with hb 4.0   Past Surgical History:  Procedure Laterality Date   INSERTION OF MESH N/A 04/19/2019   Procedure: INSERTION OF MESH;  Surgeon: Darnell Level, MD;  Location: [facility];  Service: General;  Laterality: N/A;   SALPINGECTOMY Left    due to ruptured ectopic   TOOTH EXTRACTION N/A 11/30/2022   Procedure: DENTAL RESTORATION/EXTRACTIONS;  Surgeon: Ocie Doyne, DMD;  Location: [facility];  Service: Oral Surgery;  Laterality: N/A;   VENTRAL HERNIA REPAIR N/A 04/19/2019   Procedure: VENTRAL HERNIA REPAIR;  Surgeon: Darnell Level, MD;  Location: [facility];  Service: General;  Laterality: N/A;   Social History   Tobacco Use   Smoking status: Every Day    Current packs/day: 0.25    Types: Cigarettes   Smokeless tobacco: Never   Tobacco comments:    4-5  cigarettes per day.   Wants to stop  Vaping Use   Vaping status: Never Used  Substance Use Topics   Alcohol use: Not Currently    Comment: 2012   Drug use: Not Currently    Types: Heroin, Marijuana    Comment: last time used drugs was 2012   Family Status  Relation Name Status   Mother  Deceased   Father  Deceased   Neg Hx  (Not Specified)  No partnership data on file   Family History  Problem Relation Age of Onset   Cancer Mother    Cancer Father    Colon cancer Neg Hx    Colon polyps Neg Hx    Esophageal cancer Neg Hx    Rectal cancer Neg Hx    Stomach cancer Neg Hx    No Known Allergies  ROS   ROS negative except for what is noted on the assessment and plan.  Objective:     BP 115/89 (BP Location: Left Arm, Patient Position: Sitting, Cuff Size: Normal)   Pulse 81   Temp 97.9 F (36.6 C) (Oral)  Ht 5\' 4"  (1.626 m)   Wt 204 lb 6.4 oz (92.7 kg)   LMP 07/01/2023 (Approximate)   SpO2 100%   BMI 35.09 kg/m  BP Readings from Last 3 Encounters:  09/14/23 115/89  09/04/23 120/83  11/30/22 125/79   Wt Readings from Last 3 Encounters:  09/14/23 204 lb 6.4 oz (92.7 kg)  09/04/23 170 lb (77.1 kg)  11/30/22 185 lb (83.9 kg)   SpO2 Readings from Last 3 Encounters:  09/14/23 100%  09/04/23 96%  11/30/22 92%      Physical Exam  General: Sitting in chair, no acute distress Cardiovascular: RR, no m/r/g Pulm: CTAB. No wheezing or crackles MSK: No swelling of PIP/DIP/MTP joints b/l; no tenderness. Minimal numbness of the R 3rd tip of the digit. Negative Phalen's Test Breast: Chaperone present. No lumps or masses palpated b/l breasts, no nipple retraction or nipple discharge noted b/l.   No results found for any visits on 09/14/23.  Last  CBC Lab Results  Component Value Date   WBC 3.3 (L) 11/30/2022   HGB 11.6 (L) 11/30/2022   HCT 36.8 11/30/2022   MCV 79.0 (L) 11/30/2022   MCH 24.9 (L) 11/30/2022   RDW 18.8 (H) 11/30/2022   PLT 167 11/30/2022   Last metabolic panel Lab Results  Component Value Date   GLUCOSE 75 11/30/2022   NA 138 11/30/2022   K 4.1 11/30/2022   CL 107 11/30/2022   CO2 23 11/30/2022   BUN 13 11/30/2022   CREATININE 0.85 11/30/2022   GFRNONAA >60 11/30/2022   CALCIUM 8.8 (L) 11/30/2022   PROT 6.8 11/30/2022   ALBUMIN 3.4 (L) 11/30/2022   LABGLOB 2.9 06/11/2021   AGRATIO 1.3 06/11/2021   BILITOT 0.3 11/30/2022   ALKPHOS 108 11/30/2022   AST 20 11/30/2022   ALT 12 11/30/2022   ANIONGAP 8 11/30/2022   Last lipids Lab Results  Component Value Date   CHOL 137 02/27/2022   HDL 46 02/27/2022   LDLCALC 76 02/27/2022   TRIG 77 02/27/2022   CHOLHDL 3.0 02/27/2022   Last hemoglobin A1c Lab Results  Component Value Date   HGBA1C 5.4 02/27/2022      The 10-year ASCVD risk score (Arnett DK, et al., 2019) is: 3.7%    Assessment & Plan:   Problem List Items Addressed This Visit       Musculoskeletal and Integument   Osteoarthritis of both hands   Patient reports that since she started working at [employer] 4 weeks ago, she is noticing symptoms of bilateral hand pain that is worse at night. States that she wakes up in the morning feeling that her hands her stiff and occasionally swollen. Reports it gets better right away as she starts moving and eats food. Per physical exam, no tenderness or swelling noted at the DIP/PIP/MTP/wrist joints. Negative Phalen's Test and Tinel's test. Reports somewhat numbness in her Left 3 digit. However, states that her symptoms improved as she did the Phalen's test. 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 Her symptoms are more concurrent with Osteoarthritis as the  stiffness is improving with exercise.   Plan: - Use OTC Tylenol and NSAID as needed for stiffness - Use topical Voltaren gel as needed.  - Exercise and stretch to improve the stiffness         Other   Opioid dependence (HCC)   Per the  last office note, she was following methadone clinic, currently on 29 mg. Reports she follows with them every Thursday. States she has not taken any street opioids over 12 years. She is regularly going to [employer] Takes Miralax daily for BM. Otherwise no concerns at this time  - Continue following with Methadone clinic      Generalized anxiety disorder   Last seen on 04/07/2022; this patient has longstanding history of generalized anxiety disorder. Patient reports she follows behavioral health twice a month, currently on sertraline 75 mg daily. Reports this medicine is helping anxiety.   - Continue Sertraline 75 mg daily - Continue following up with Behavioral health       Healthcare maintenance - Primary   Encounter for screening mammogram for breast cancer   Last Mammogram on 07/03/2020 that did not note any evidence of malignancy. She reports she has a lump on her R breast that has been present for several years; denies any changes in size. Denies any pain. No abnormal nipple discharge. Per the PE, No apparent masses or lumps noted bilateral breast, no nipple discharge or nipple retraction noted. Patient then states that she usually notices it prior to her menstrual cycle. She was just concerned, was previously told that it was a cyst and no intervention is needed. Presently, no palpable masses are noted. She is reassured to get a mammogram as she is due for one. \  - MM digital screening       Other Visit Diagnoses       Encounter for screening mammogram for malignant neoplasm of breast       Relevant Orders   MM Digital Screening       Return in about 3 months (around 12/13/2023) for As needed .    Jeral Pinch, DO

## 2023-09-15 NOTE — Progress Notes (Signed)
Internal Medicine Clinic Attending  I was physically present during the key portions of the resident provided service and participated in the medical decision making of patient's management care. I reviewed pertinent patient test results.  The assessment, diagnosis, and plan were formulated together and I agree with the documentation in the resident's note.  Lau, Grace, MD  

## 2023-09-29 ENCOUNTER — Telehealth (HOSPITAL_COMMUNITY): Payer: Self-pay | Admitting: Student in an Organized Health Care Education/Training Program

## 2023-09-29 DIAGNOSIS — F410 Panic disorder [episodic paroxysmal anxiety] without agoraphobia: Secondary | ICD-10-CM

## 2023-09-29 MED ORDER — HYDROXYZINE HCL 10 MG PO TABS: 10.0000 mg | ORAL_TABLET | Freq: Three times a day (TID) | ORAL | 0 refills | Status: DC | PRN

## 2023-09-29 MED ORDER — HYDROXYZINE HCL 10 MG PO TABS: 10.0000 mg | ORAL_TABLET | Freq: Three times a day (TID) | ORAL | 1 refills | Status: AC | PRN

## 2023-09-29 MED ORDER — SERTRALINE HCL 50 MG PO TABS: 75.0000 mg | ORAL_TABLET | Freq: Every day | ORAL | 1 refills | Status: DC

## 2023-09-29 NOTE — Telephone Encounter (Signed)
 During this conversation, Paula Leon, Chiropodist was present.  Patient presented to the office today to schedule an appointment as she did miss her appointment originally scheduled for 12/13.  Discussed with her that her behavior during a phone call near the end of November, when attempting to schedule her son for an injection clinic appointment, was inappropriate.  Discussed with her that as she was screaming and cursing at front desk staff she would be discharged from the clinic.  Discussed that she would be provided with 2 months of her medications to bridge her and that a list of alternative providers would be mailed to her.  After this she then came back to the front desk and started berating staff again.  At this point security was called to escort her off the premises.  However, she left without further incident.   Sent: -Zoloft  75 mg daily.  45 (50 mg) tablets with 1 refill. -Atarax  10 mg TID PRN.  60 tablets with 1 refill.    Marolyn Rosser MD Resident

## 2023-10-05 ENCOUNTER — Telehealth (HOSPITAL_COMMUNITY): Payer: Self-pay | Admitting: Student in an Organized Health Care Education/Training Program

## 2023-12-08 ENCOUNTER — Ambulatory Visit: Payer: Medicare Other | Admitting: Student

## 2023-12-08 DIAGNOSIS — M79641 Pain in right hand: Secondary | ICD-10-CM

## 2023-12-08 DIAGNOSIS — F410 Panic disorder [episodic paroxysmal anxiety] without agoraphobia: Secondary | ICD-10-CM

## 2023-12-08 DIAGNOSIS — M79672 Pain in left foot: Secondary | ICD-10-CM | POA: Insufficient documentation

## 2023-12-08 MED ORDER — SERTRALINE HCL 50 MG PO TABS: 75.0000 mg | ORAL_TABLET | Freq: Every day | ORAL | 3 refills | Status: DC

## 2023-12-08 NOTE — Assessment & Plan Note (Signed)
 Patient notes pain in her left hand localized near her first metacarpal.  Minimal pain with movement but pain upon palpation.  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  I am concerned that there is an overuse component of this leading to osteoarthritis.  She may also be developing trigger finger.  Will further evaluate with an x-ray of this hand. - Follow-up x-ray of right hand

## 2023-12-08 NOTE — Patient Instructions (Signed)
 Thank you so much for coming to the clinic today!   Please reach out to [place] if you are unable to schedule your appointments.   If you have any questions please feel free to the call the clinic at anytime at [phone]. It was a pleasure seeing you!  Best, Dr. Rayvon Char

## 2023-12-08 NOTE — Progress Notes (Signed)
 CC: Left foot pain and right hand pain  HPI: [name] is a 58 y.o. female living with a history stated below and presents today for left foot pain and right hand pain. Please see problem based assessment and plan for additional details.  Past Medical History:  Diagnosis Date   Anemia    IDA   Depression    Hernia of abdominal wall    History of drug abuse (HCC)    has not used in ten years   Substance abuse (HCC)    2012 QUIT Heroin   Transfusion history    due to anemia with hb 4.0    Current Outpatient Medications on File Prior to Visit  Medication Sig Dispense Refill   diclofenac Sodium (VOLTAREN ARTHRITIS PAIN) 1 % GEL Apply 4 g topically 4 (four) times daily. 2 g 3   ferrous sulfate 325 (65 FE) MG tablet Take 1 tablet (325 mg total) by mouth daily with breakfast. 90 tablet 2   hydrOXYzine (ATARAX) 10 MG tablet Take 1 tablet (10 mg total) by mouth every 8 (eight) hours as needed. 60 tablet 1   methadone (DOLOPHINE) 10 MG/5ML solution Take 30 mg by mouth every morning.     No current facility-administered medications on file prior to visit.    Family History  Problem Relation Age of Onset   Cancer Mother    Cancer Father    Colon cancer Neg Hx    Colon polyps Neg Hx    Esophageal cancer Neg Hx    Rectal cancer Neg Hx    Stomach cancer Neg Hx     Social History   Socioeconomic History   Marital status: Single    Spouse name: Not on file   Number of children: Not on file   Years of education: Not on file   Highest education level: Not on file  Occupational History   Not on file  Tobacco Use   Smoking status: Every Day    Current packs/day: 0.25    Types: Cigarettes   Smokeless tobacco: Never   Tobacco comments:    4-5  cigarettes per day.   Wants to stop  Vaping Use   Vaping status: Never Used  Substance and Sexual Activity   Alcohol use: Not Currently    Comment: 2012   Drug use: Not Currently    Types: Heroin, Marijuana    Comment: last time  used drugs was 2012   Sexual activity: Not Currently    Birth control/protection: Surgical  Other Topics Concern   Not on file  Social History Narrative   Not on file   Social Drivers of Health   Financial Resource Strain: Medium Risk (02/25/2022)   Overall Financial Resource Strain (CARDIA)    Difficulty of Paying Living Expenses: Somewhat hard  Food Insecurity: Food Insecurity Present (02/25/2022)   Hunger Vital Sign    Worried About Running Out of Food in the Last Year: Often true    Ran Out of Food in the Last Year: Often true  Transportation Needs: No Transportation Needs (02/25/2022)   PRAPARE - Administrator, Civil Service (Medical): No    Lack of Transportation (Non-Medical): No  Physical Activity: Insufficiently Active (02/25/2022)   Exercise Vital Sign    Days of Exercise per Week: 7 days    Minutes of Exercise per Session: 20 min  Stress: Stress Concern Present (02/25/2022)   [employer] of Occupational Health - Occupational Stress Questionnaire  Feeling of Stress : Very much  Social Connections: Moderately Isolated (02/25/2022)   Social Connection and Isolation Panel [NHANES]    Frequency of Communication with Friends and Family: More than three times a week    Frequency of Social Gatherings with Friends and Family: More than three times a week    Attends Religious Services: More than 4 times per year    Active Member of [employer] or Organizations: No    Attends Banker Meetings: Never    Marital Status: Never married  Intimate Partner Violence: Not At Risk (02/25/2022)   Humiliation, Afraid, Rape, and Kick questionnaire    Fear of Current or Ex-Partner: No    Emotionally Abused: No    Physically Abused: No    Sexually Abused: No    Review of Systems: ROS negative except for what is noted on the assessment and plan.  There were no vitals filed for this visit.  Physical Exam: Constitutional: well-appearing in no acute distress HENT:  normocephalic atraumatic, mucous membranes moist Eyes: conjunctiva non-erythematous Neck: supple Cardiovascular: regular rate and rhythm, no m/r/g Pulmonary/Chest: normal work of breathing on room air, lungs clear to auscultation bilaterally Abdominal: soft, non-tender, non-distended MSK: normal bulk and tone; pain on palpation of right hand near base of thumb; pain with dorsiflexion and plantarflexion of left toe with pain upon palpation and notable swelling  Neurological: alert & oriented x 3, 5/5 strength in bilateral upper and lower extremities, normal gait Skin: warm and dry  Assessment & Plan:  Left foot pain Presented to urgent care December after hitting her foot on a pallet at work.  At this visit, her x-ray was negative.  Her pain was thought to have been secondary to a gout flare, and she was prescribed prednisone.  Of note, the patient never took prednisone.  Today, she presents with persistent pain.  She is unable to put pressure on her left toe and cannot bend it.  She has had to take ibuprofen every day with work to help with the pain.  Unclear if etiology is chronic untreated gout versus stress fracture.  Will follow-up with CT of the left foot.  If this is a gout flare, consider treating with prednisone.  If this is a stress fracture, may need follow-up with ortho. - Follow-up CT of left foot  Right hand pain Patient notes pain in her left hand localized near her first metacarpal.  Minimal pain with movement but pain upon palpation.  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  I am concerned that there is an overuse component of this leading to osteoarthritis.  She may also be developing trigger finger.  Will further evaluate with an x-ray of this hand. - Follow-up x-ray of right hand  Patient discussed with Dr. Darletta Moll, MD  [facility] Internal Medicine, PGY-1 Date 12/08/2023 Time 11:09 AM

## 2023-12-08 NOTE — Assessment & Plan Note (Signed)
 Presented to urgent care December after hitting her foot on a pallet at work.  At this visit, her x-ray was negative.  Her pain was thought to have been secondary to a gout flare, and she was prescribed prednisone.  Of note, the patient never took prednisone.  Today, she presents with persistent pain.  She is unable to put pressure on her left toe and cannot bend it.  She has had to take ibuprofen every day with work to help with the pain.  Unclear if etiology is chronic untreated gout versus stress fracture.  Will follow-up with CT of the left foot.  If this is a gout flare, consider treating with prednisone.  If this is a stress fracture, may need follow-up with ortho. - Follow-up CT of left foot

## 2023-12-13 ENCOUNTER — Telehealth: Payer: Self-pay

## 2023-12-13 NOTE — Progress Notes (Signed)
Internal Medicine Clinic Attending  I was physically present during the key portions of the resident provided service and participated in the medical decision making of patient's management care. I reviewed pertinent patient test results.  The assessment, diagnosis, and plan were formulated together and I agree with the documentation in the resident's note.  Williams, Julie Anne, MD  

## 2023-12-13 NOTE — Telephone Encounter (Signed)
 Patient overdue to a mammogram, I called the patient to scheduled a mammogram I was unable to reach her,mailbox is full.

## 2023-12-15 ENCOUNTER — Telehealth: Payer: Self-pay | Admitting: *Deleted

## 2023-12-15 NOTE — Telephone Encounter (Signed)
Called pt - mailbox is full;unable to leave a message. 

## 2023-12-15 NOTE — Telephone Encounter (Signed)
 Copied from CRM [id number]. Topic: Appointments - Appointment Scheduling >> Dec 15, 2023 10:51 AM Yvone Neu wrote: Patient was returning a call to make her appointment for her mammograph, patients callback number is [phone].

## 2023-12-15 NOTE — Telephone Encounter (Signed)
 Copied from CRM [id number]. Topic: General - Other >> Dec 15, 2023 10:54 AM Paula Leon wrote: Reason for CRM: Patient was calling to check the status of her CT for her foot, patient states she has not heard anything and would like someone to call her so she can see what's going on [phone]. She said she is okay with getting the number to the place and she will call and make her own appointment because its taking to long.

## 2023-12-18 ENCOUNTER — Encounter: Payer: Self-pay | Admitting: *Deleted

## 2023-12-18 NOTE — Telephone Encounter (Signed)
 My Chart message sent to pt re: CT appt.

## 2023-12-18 NOTE — Telephone Encounter (Signed)
 Pt has been called and her VM is full and a message can not be left for the scheduled appointment.  Name: Lunden, Stieber MRN: [id number]  Date: 12/21/2023 Status: Sch  Time: 10:30 AM Length: 30  Visit Type: CT FOOT LEFT WO CONTRAST [[id number]] Copay: $0.00  Provider: [facility]

## 2023-12-21 ENCOUNTER — Ambulatory Visit (HOSPITAL_COMMUNITY): Attending: Internal Medicine

## 2024-01-01 ENCOUNTER — Telehealth: Payer: Self-pay | Admitting: *Deleted

## 2024-01-01 NOTE — Telephone Encounter (Signed)
 Mammogram appointment  May 1.2025 @ 10:10 am / appointment scheduled  by daughter

## 2024-01-04 ENCOUNTER — Ambulatory Visit (HOSPITAL_COMMUNITY)
Admission: RE | Admit: 2024-01-04 | Discharge: 2024-01-04 | Disposition: A | Discharge status: Home / Self Care | Source: Ambulatory Visit | Attending: Internal Medicine | Admitting: Internal Medicine

## 2024-01-04 ENCOUNTER — Encounter (HOSPITAL_COMMUNITY): Payer: Self-pay

## 2024-01-04 DIAGNOSIS — M79672 Pain in left foot: Secondary | ICD-10-CM

## 2024-01-11 ENCOUNTER — Ambulatory Visit (HOSPITAL_COMMUNITY)

## 2024-01-18 ENCOUNTER — Ambulatory Visit (HOSPITAL_COMMUNITY)
Admission: RE | Admit: 2024-01-18 | Discharge: 2024-01-18 | Disposition: A | Discharge status: Home / Self Care | Source: Ambulatory Visit | Attending: Internal Medicine | Admitting: Internal Medicine

## 2024-01-18 DIAGNOSIS — M79672 Pain in left foot: Secondary | ICD-10-CM | POA: Insufficient documentation

## 2024-01-25 ENCOUNTER — Ambulatory Visit

## 2024-02-02 ENCOUNTER — Ambulatory Visit

## 2024-03-04 ENCOUNTER — Encounter: Payer: Self-pay | Admitting: *Deleted

## 2024-03-14 ENCOUNTER — Telehealth: Payer: Self-pay | Admitting: *Deleted

## 2024-03-14 NOTE — Telephone Encounter (Signed)
 Called patient left message with daughter to have her mother to call the breast center [phone]) to schedule her mammogram appointment / patient cancelled 01-25-2024 and not showed 02-02-2024 mammogram appointments.

## 2024-03-28 ENCOUNTER — Ambulatory Visit

## 2024-04-09 ENCOUNTER — Ambulatory Visit
Admission: RE | Admit: 2024-04-09 | Discharge: 2024-04-09 | Disposition: A | Discharge status: Home / Self Care | Source: Ambulatory Visit | Attending: Internal Medicine

## 2024-04-09 DIAGNOSIS — Z1231 Encounter for screening mammogram for malignant neoplasm of breast: Secondary | ICD-10-CM

## 2024-06-13 ENCOUNTER — Ambulatory Visit: Payer: Self-pay

## 2024-06-13 ENCOUNTER — Other Ambulatory Visit: Payer: Self-pay

## 2024-06-13 DIAGNOSIS — F410 Panic disorder [episodic paroxysmal anxiety] without agoraphobia: Secondary | ICD-10-CM

## 2024-06-13 MED ORDER — SERTRALINE HCL 50 MG PO TABS: 75.0000 mg | ORAL_TABLET | Freq: Every day | ORAL | 3 refills | Status: AC

## 2024-06-13 NOTE — Telephone Encounter (Signed)
 Follow up call to patient.  Doing ok. States she will go to the Urgent Care on tomorrow.  Patient was encouraged to go today if symptoms worsen.

## 2024-06-13 NOTE — Telephone Encounter (Signed)
 FYI Only or Action Required?: FYI only for provider.  Patient was last seen in primary care on 12/08/2023 by Netra Postlethwait Freund, MD.  Called Nurse Triage reporting Palpitations and Medication Refill.  Symptoms began a week ago.  Interventions attempted: Nothing.  Symptoms are: intermittent palpitations (lasts a few seconds) and worse when taking methadone , smoking or drinking coffee; left sided breast/chest pain intermittent pinching (lasts a few seconds), insomnia, decreased appetite stable.  Triage Disposition: See Physician Within 24 Hours- Agreeable to go to UC or ED today  Patient/caregiver understands and will follow disposition?: Yes                        Copied from CRM #[id number]. Topic: Clinical - Red Word Triage >> Jun 13, 2024  9:45 AM Susanna ORN wrote: Red Word that prompted transfer to Nurse Triage: Patient states she's been having palpitations in her chest for about a week. States she feels tired and it happens right after she smokes or drink coffee. Wants to make an appt to see provider. Reason for Disposition  [1] Chest pain lasts < 5 minutes AND [2] NO chest pain or cardiac symptoms (e.g., breathing difficulty, sweating) now  (Exception: Chest pains that last only a few seconds.)  Answer Assessment - Initial Assessment Questions 1. DESCRIPTION: Please describe your heart rate or heartbeat that you are having (e.g., fast/slow, regular/irregular, skipped or extra beats, palpitations)     Palpitations: feels like palpitate a little bit and then it'll stop, not hurting, breathing will be shallow and then it goes back  2. ONSET: When did it start? (e.g., minutes, hours, days)      X 1 week.  3. DURATION: How long does it last (e.g., seconds, minutes, hours)     Few seconds.  4. PATTERN Does it come and go, or has it been constant since it started?  Does it get worse with exertion?   Are you feeling it now?     Comes and goes, happens after  taking her methadone  medication and once or twice after drinking coffee or smoking. She states she noticed this started after changing her methadone  clinic.  5. TAP: Using your hand, can you tap out what you are feeling on a chair or table in front of you, so that I can hear? Note: Not all patients can do this.       N/A.  6. HEART RATE: Can you tell me your heart rate? How many beats in 15 seconds?  Note: Not all patients can do this.       No.  7. RECURRENT SYMPTOM: Have you ever had this before? If Yes, ask: When was the last time? and What happened that time?      No.  8. CAUSE: What do you think is causing the palpitations?     She thinks this could be related to her methadone  (she was unsure if they have cut down on her dosage and feels like it is withdrawals); she wants to make sure no heart problems.  9. CARDIAC HISTORY: Do you have any history of heart disease? (e.g., heart attack, angina, bypass surgery, angioplasty, arrhythmia)      No.  10. OTHER SYMPTOMS: Do you have any other symptoms? (e.g., dizziness, chest pain, sweating, difficulty breathing)       Insomnia, she states her breathing feels shallow during the palpitations, left sided chest pain (feels like pinching, under breast, lasts a few seconds, occurs when drinking  Miralax  without diluting with water. Felt just a few minutes ago when she put the miralax  in her mouth unmixed and then drank with water). Decreased appetite x 7 days. Denies nausea, vomiting.  11. PREGNANCY: Is there any chance you are pregnant? When was your last menstrual period?       N/A.  She states she has cut down on drinking as much coffee and has been trying to drink more water.  Protocols used: Heart Rate and Heartbeat Questions-A-AH, Chest Pain-A-AH

## 2024-06-25 ENCOUNTER — Ambulatory Visit: Payer: Self-pay | Admitting: Student

## 2024-07-03 ENCOUNTER — Ambulatory Visit

## 2024-07-29 ENCOUNTER — Ambulatory Visit: Admitting: Student

## 2024-08-08 ENCOUNTER — Ambulatory Visit: Admitting: Student

## 2024-08-08 NOTE — Progress Notes (Deleted)
 CC: ***  HPI: [name] is a 58 y.o. female living with a history stated below and presents today for ***. Please see problem based assessment and plan for additional details.  Past Medical History:  Diagnosis Date   Anemia    IDA   Depression    Hernia of abdominal wall    History of drug abuse (HCC)    has not used in ten years   Substance abuse (HCC)    2012 QUIT Heroin   Transfusion history    due to anemia with hb 4.0    Current Outpatient Medications on File Prior to Visit  Medication Sig Dispense Refill   diclofenac  Sodium (VOLTAREN  ARTHRITIS PAIN) 1 % GEL Apply 4 g topically 4 (four) times daily. 2 g 3   ferrous sulfate  325 (65 FE) MG tablet Take 1 tablet (325 mg total) by mouth daily with breakfast. 90 tablet 2   hydrOXYzine  (ATARAX ) 10 MG tablet Take 1 tablet (10 mg total) by mouth every 8 (eight) hours as needed. 60 tablet 1   methadone  (DOLOPHINE ) 10 MG/5ML solution Take 30 mg by mouth every morning.     sertraline  (ZOLOFT ) 50 MG tablet Take 1.5 tablets (75 mg total) by mouth daily. 45 tablet 3   No current facility-administered medications on file prior to visit.    Family History  Problem Relation Age of Onset   Cancer Mother    Cancer Father    Colon cancer Neg Hx    Colon polyps Neg Hx    Esophageal cancer Neg Hx    Rectal cancer Neg Hx    Stomach cancer Neg Hx     Social History   Socioeconomic History   Marital status: Single    Spouse name: Not on file   Number of children: Not on file   Years of education: Not on file   Highest education level: Not on file  Occupational History   Not on file  Tobacco Use   Smoking status: Every Day    Current packs/day: 0.25    Types: Cigarettes   Smokeless tobacco: Never   Tobacco comments:    4-5  cigarettes per day.   Wants to stop  Vaping Use   Vaping status: Never Used  Substance and Sexual Activity   Alcohol use: Not Currently    Comment: 2012   Drug use: Not Currently    Types: Heroin,  Marijuana    Comment: last time used drugs was 2012   Sexual activity: Not Currently    Birth control/protection: Surgical  Other Topics Concern   Not on file  Social History Narrative   Not on file   Social Drivers of Health   Financial Resource Strain: Medium Risk (02/25/2022)   Overall Financial Resource Strain (CARDIA)    Difficulty of Paying Living Expenses: Somewhat hard  Food Insecurity: Food Insecurity Present (02/25/2022)   Hunger Vital Sign    Worried About Running Out of Food in the Last Year: Often true    Ran Out of Food in the Last Year: Often true  Transportation Needs: No Transportation Needs (02/25/2022)   PRAPARE - Administrator, Civil Service (Medical): No    Lack of Transportation (Non-Medical): No  Physical Activity: Insufficiently Active (02/25/2022)   Exercise Vital Sign    Days of Exercise per Week: 7 days    Minutes of Exercise per Session: 20 min  Stress: Stress Concern Present (02/25/2022)   [employer] of Occupational  Health - Occupational Stress Questionnaire    Feeling of Stress : Very much  Social Connections: Moderately Isolated (02/25/2022)   Social Connection and Isolation Panel    Frequency of Communication with Friends and Family: More than three times a week    Frequency of Social Gatherings with Friends and Family: More than three times a week    Attends Religious Services: More than 4 times per year    Active Member of [employer] or Organizations: No    Attends Banker Meetings: Never    Marital Status: Never married  Intimate Partner Violence: Not At Risk (02/25/2022)   Humiliation, Afraid, Rape, and Kick questionnaire    Fear of Current or Ex-Partner: No    Emotionally Abused: No    Physically Abused: No    Sexually Abused: No    Review of Systems: ROS negative except for what is noted on the assessment and plan.  There were no vitals filed for this visit.  Physical Exam  Physical Exam: Constitutional:  well-appearing *** sitting in ***, in no acute distress HENT: normocephalic atraumatic, mucous membranes moist Eyes: conjunctiva non-erythematous Cardiovascular: regular rate and rhythm, no m/r/g Pulmonary/Chest: normal work of breathing on room air, lungs clear to auscultation bilaterally Abdominal: soft, non-tender, non-distended MSK: *** Neurological: alert & oriented x 3, 5/5 strength in bilateral upper and lower extremities, normal gait Skin: warm and dry Psych: ***  Assessment & Plan:   Assessment & Plan     No orders of the defined types were placed in this encounter.  PMH tobacco abuse, palpitations, opioid dependence follows with methadone  clinic, IDA, anxiety  Anxiety On sertraline  75 mg daily***  Palpitations    Mammogram 03/2024 without evidence of malignancy BI-RADS 1    No follow-ups on file.   Patient {GC/[id number]::discussed with,seen with} Dr. {[id number]::Tpoopjfd,Z. Hoffman,Winfrey,Narendra,Chun,Chambliss,Lau,Machen}  Ozell Nearing, D.O. [facility] Internal Medicine, PGY-3 Clinic Phone: [phone] Date 08/08/2024 Time 7:41 AM

## 2024-08-29 ENCOUNTER — Encounter (HOSPITAL_BASED_OUTPATIENT_CLINIC_OR_DEPARTMENT_OTHER): Payer: Self-pay | Admitting: Surgery
